# Patient Record
Sex: Male | Born: 1960
Health system: Southern US, Community
[De-identification: ages and names within clinical notes are randomized; demographics above are authoritative.]

## PROBLEM LIST (undated history)

## (undated) DIAGNOSIS — I10 Essential (primary) hypertension: Secondary | ICD-10-CM

## (undated) DIAGNOSIS — M1990 Unspecified osteoarthritis, unspecified site: Secondary | ICD-10-CM

## (undated) HISTORY — PX: ANKLE FRACTURE SURGERY: SHX122

## (undated) HISTORY — DX: Essential (primary) hypertension: I10

---

## 2009-03-06 ENCOUNTER — Emergency Department (HOSPITAL_COMMUNITY): Admission: EM | Admit: 2009-03-06 | Discharge: 2009-03-06 | Payer: Self-pay | Admitting: Emergency Medicine

## 2009-08-26 ENCOUNTER — Encounter (INDEPENDENT_AMBULATORY_CARE_PROVIDER_SITE_OTHER): Payer: Self-pay | Admitting: Family Medicine

## 2009-08-26 ENCOUNTER — Ambulatory Visit: Payer: Self-pay | Admitting: Vascular Surgery

## 2009-08-26 ENCOUNTER — Ambulatory Visit (HOSPITAL_COMMUNITY): Admission: RE | Admit: 2009-08-26 | Discharge: 2009-08-26 | Payer: Self-pay | Admitting: Family Medicine

## 2010-11-07 LAB — CBC
HCT: 45.8 % (ref 39.0–52.0)
Hemoglobin: 14.5 g/dL (ref 13.0–17.0)
MCV: 88.3 fL (ref 78.0–100.0)
Platelets: 252 10*3/uL (ref 150–400)
WBC: 14.1 10*3/uL — ABNORMAL HIGH (ref 4.0–10.5)

## 2010-11-07 LAB — COMPREHENSIVE METABOLIC PANEL
Albumin: 4 g/dL (ref 3.5–5.2)
BUN: 10 mg/dL (ref 6–23)
CO2: 29 mEq/L (ref 19–32)
Chloride: 100 mEq/L (ref 96–112)
Creatinine, Ser: 0.95 mg/dL (ref 0.4–1.5)
GFR calc non Af Amer: >60 mL/min (ref >60)
Glucose, Bld: 101 mg/dL — ABNORMAL HIGH (ref 70–99)
Total Bilirubin: 0.7 mg/dL (ref 0.3–1.2)

## 2010-11-07 LAB — DIFFERENTIAL
Basophils Absolute: 0.1 10*3/uL (ref 0.0–0.1)
Basophils Relative: 1 % (ref 0–1)
Lymphocytes Relative: 23 % (ref 12–46)
Neutro Abs: 9.6 10*3/uL — ABNORMAL HIGH (ref 1.7–7.7)
Neutrophils Relative %: 68 % (ref 43–77)

## 2010-11-07 LAB — LIPASE, BLOOD: Lipase: 27 U/L (ref 11–59)

## 2011-01-16 ENCOUNTER — Emergency Department (HOSPITAL_COMMUNITY): Payer: PRIVATE HEALTH INSURANCE

## 2011-01-16 ENCOUNTER — Emergency Department (HOSPITAL_COMMUNITY)
Admission: EM | Admit: 2011-01-16 | Discharge: 2011-01-16 | Disposition: A | Discharge status: Home / Self Care | Payer: PRIVATE HEALTH INSURANCE | Attending: Emergency Medicine | Admitting: Emergency Medicine

## 2011-01-16 DIAGNOSIS — S43016A Anterior dislocation of unspecified humerus, initial encounter: Secondary | ICD-10-CM | POA: Insufficient documentation

## 2011-01-16 DIAGNOSIS — M542 Cervicalgia: Secondary | ICD-10-CM | POA: Insufficient documentation

## 2011-01-16 DIAGNOSIS — IMO0002 Reserved for concepts with insufficient information to code with codable children: Secondary | ICD-10-CM | POA: Insufficient documentation

## 2011-01-16 DIAGNOSIS — M25519 Pain in unspecified shoulder: Secondary | ICD-10-CM | POA: Insufficient documentation

## 2015-06-14 ENCOUNTER — Emergency Department (HOSPITAL_COMMUNITY): Payer: BLUE CROSS/BLUE SHIELD

## 2015-06-14 ENCOUNTER — Encounter (HOSPITAL_COMMUNITY): Payer: Self-pay

## 2015-06-14 ENCOUNTER — Emergency Department (HOSPITAL_COMMUNITY)
Admission: EM | Admit: 2015-06-14 | Discharge: 2015-06-14 | Disposition: A | Discharge status: Home / Self Care | Payer: BLUE CROSS/BLUE SHIELD | Attending: Emergency Medicine | Admitting: Emergency Medicine

## 2015-06-14 DIAGNOSIS — S82832A Other fracture of upper and lower end of left fibula, initial encounter for closed fracture: Secondary | ICD-10-CM

## 2015-06-14 DIAGNOSIS — S99912A Unspecified injury of left ankle, initial encounter: Secondary | ICD-10-CM | POA: Diagnosis present

## 2015-06-14 DIAGNOSIS — S8252XA Displaced fracture of medial malleolus of left tibia, initial encounter for closed fracture: Secondary | ICD-10-CM | POA: Diagnosis not present

## 2015-06-14 DIAGNOSIS — W010XXA Fall on same level from slipping, tripping and stumbling without subsequent striking against object, initial encounter: Secondary | ICD-10-CM | POA: Insufficient documentation

## 2015-06-14 DIAGNOSIS — Y9389 Activity, other specified: Secondary | ICD-10-CM | POA: Diagnosis not present

## 2015-06-14 DIAGNOSIS — S8002XA Contusion of left knee, initial encounter: Secondary | ICD-10-CM | POA: Diagnosis not present

## 2015-06-14 DIAGNOSIS — Y998 Other external cause status: Secondary | ICD-10-CM | POA: Insufficient documentation

## 2015-06-14 DIAGNOSIS — S82102A Unspecified fracture of upper end of left tibia, initial encounter for closed fracture: Secondary | ICD-10-CM | POA: Diagnosis not present

## 2015-06-14 DIAGNOSIS — S82892A Other fracture of left lower leg, initial encounter for closed fracture: Secondary | ICD-10-CM

## 2015-06-14 DIAGNOSIS — Y9289 Other specified places as the place of occurrence of the external cause: Secondary | ICD-10-CM | POA: Diagnosis not present

## 2015-06-14 MED ORDER — OXYCODONE-ACETAMINOPHEN 5-325 MG PO TABS
1.0000 | ORAL_TABLET | Freq: Once | ORAL | Status: AC
  Administered 2015-06-14: 1 via ORAL
  Filled 2015-06-14: qty 1

## 2015-06-14 MED ORDER — OXYCODONE-ACETAMINOPHEN 5-325 MG PO TABS: 1.0000 | ORAL_TABLET | Freq: Four times a day (QID) | ORAL | Status: DC | PRN

## 2015-06-14 NOTE — Progress Notes (Signed)
Orthopedic Tech Progress Note Patient Details:  Brandon Reyes 1960-12-23 [id number]  Ortho Devices Type of Ortho Device: Ace wrap, Crutches, Post (short leg) splint, Knee Immobilizer Ortho Device/Splint Location: lle Ortho Device/Splint Interventions: Application   Skylynn Burkley 06/14/2015, 2:57 PM

## 2015-06-14 NOTE — ED Notes (Signed)
Pt reports onset 2 nights ago got out of car and slipped in leaves, hurt left ankle.  Pain to medial left ankle and swelling.  + pedal pulses.  Using own crutches, unable to bear full weight.

## 2015-06-14 NOTE — ED Notes (Signed)
Patient has crutches at bedside.

## 2015-06-14 NOTE — ED Notes (Signed)
Called ortho to come place splint and knee immobilizer.

## 2015-06-14 NOTE — Progress Notes (Signed)
Orthopedic Tech Progress Note Patient Details:  Brandon Reyes Sep 03, 1960 [id number]  Patient ID: [name], male   DOB: 01-16-1961, 54 y.o.   MRN: [id number] Crutch order reinstated  Hildred Priest 06/14/2015, 2:58 PM

## 2015-06-14 NOTE — ED Provider Notes (Addendum)
CSN: [id number]     Arrival date & time 06/14/15  1249 History   First MD Initiated Contact with Patient 06/14/15 1258     Chief Complaint  Patient presents with  . Ankle Injury     (Consider location/radiation/quality/duration/timing/severity/associated sxs/prior Treatment) HPI   54 year old male presents for evaluation of left leg injury. Patient states 2 days ago when he got out from his car he slipped on leaves, and fell down to the ground. He developed acute onset of pain to his left knee and left ankle. Pain is sharp throbbing achy, persistent, worsening with ambulation. He has noticed increasing bruising and swelling. He has been able to ambulate using his crutches but states pain has persisted. Aside from soaking and using warm and cool compress denies any other specific treatment. Denies any numbness. No complaints of headache, loss of consciousness, back pain or injury to any other location. He denies any precipitating symptoms prior to the fall.  No past medical history on file. No past surgical history on file. No family history on file. Social History  Substance Use Topics  . Smoking status: Never Smoker   . Smokeless tobacco: Not on file  . Alcohol Use: 3.6 oz/week    6 Cans of beer per week    Review of Systems  Constitutional: Negative for fever.  Musculoskeletal: Positive for joint swelling and arthralgias.  Skin: Negative for wound.  Neurological: Negative for numbness.      Allergies  Review of patient's allergies indicates no known allergies.  Home Medications   Prior to Admission medications   Medication Sig Start Date End Date Taking? Authorizing Provider  oxyCODONE-acetaminophen (PERCOCET/ROXICET) 5-325 MG tablet Take 1 tablet by mouth every 6 (six) hours as needed for severe pain. 06/14/15   Domenic Moras, PA-C   BP 153/88 mmHg  Pulse 77  Temp(Src) 98.1 F (36.7 C) (Oral)  Resp 16  SpO2 97% Physical Exam  Constitutional: He appears  well-developed and well-nourished. No distress.  HENT:  Head: Atraumatic.  Eyes: Conjunctivae are normal.  Neck: Neck supple.  Cardiovascular: Intact distal pulses.   Musculoskeletal: He exhibits tenderness (L knee: tenderness to lateral knee inferior to knee cap with mild bruising and swelling noted, no crepitus. normal knee flexion/extension.  L ankle:  tenderness to medial/lateral/posterior malleolar on palpation with decreased ROM, moderate swelling/bruise).  Neurological: He is alert.  Skin: No rash noted.  Psychiatric: He has a normal mood and affect.  Nursing note and vitals reviewed.   ED Course  Procedures (including critical care time)  12:34 PM Mechanical fall 2 days ago here with knee and ankle pain.  Swelling and bruising noted to L ankle.  NVI.  Xrays ordered, pain medication given.    12:34 PM L ankle xray with small avulsion type fx left ankle medial malleolar tip.  L tib/fib: nondsplaced diaphyseal fracture proximal left fibula.    Will consult ortho.  Pt will be placed in a knee immobilizer and a posterior splint.  He has crutches available.    12:34 PM Appreciates consultation from Dr. Edmonia Lynch who agrees to f/u with pt on MOnday at 8:30am for further care.  Pt is aware and agrees with plan.    Labs Review Labs Reviewed - No data to display  Imaging Review No results found. I have personally reviewed and evaluated these images and lab results as part of my medical decision-making.   EKG Interpretation None      MDM   Final diagnoses:  Fracture of tibia, proximal, left, closed  Left malleolar fracture, closed, initial encounter  Fracture, fibula, proximal, left, closed, initial encounter    BP 153/88 mmHg  Pulse 77  Temp(Src) 98.1 F (36.7 C) (Oral)  Resp 16  SpO2 97%      Domenic Moras, PA-C 06/14/15 2153  Gareth Morgan, MD 06/15/15 [id number]  Gareth Morgan, MD 07/11/15 1234  Gareth Morgan, MD 07/11/15 1234

## 2015-06-14 NOTE — Discharge Instructions (Signed)
You have a broken bone near your left knee and a broken bone in your left ankle.  Wear knee immobilizer, and ankle splint.  Use crutches, and stay non weight bearing.  Take pain medication as needed and follow up with orthopedist specialist next week for further care.   Fibular Fracture With Rehab The fibula is the smaller of the two lower leg bones and is vulnerable to breaks (fracture). Fibular fractures may go fully through the bone (complete) or partially (incomplete). The bone fragments are rarely out of alignment (displaced fracture). Fibula fractures may occur anywhere along the bone. However, this document only discusses fractures that do not involve a leg joint. Fibular fractures are not often a severe injury because the bone supports only about 17% of the body weight. SYMPTOMS   Moderate to severe pain in the lower leg.  Tenderness and swelling in the leg or calf.  Bleeding and/or bruising (contusion) in the leg.  Inability to bear weight on the injured extremity.  Visible deformity, if the fracture is displaced.  Numbness and coldness in the leg and foot, beyond the fracture site, if blood supply is impaired. CAUSES  Fractures occur when a force is placed on the bone that is greater than it can withstand. Common causes of fibular fracture include:  Direct hit (trauma) (i.e., hockey or lacrosse check to the lower leg).  Stress fracture (weakening of the bone from repeated stress).  Indirect injury, caused by twisting, turning quickly, or violent muscle contraction. RISK INCREASES WITH:  Contact sports (i.e., football, soccer, lacrosse, hockey).  Sports that can cause twisted ankle injury (i.e., skiing, basketball).  Bony abnormalities (i.e., osteoporosis or bone tumors).  Metabolism disorders, hormone problems, and nutrition deficiency and disorders (i.e., anorexia and bulimia).  Poor strength and flexibility. PREVENTION   Warm up and stretch properly before  activity.  Maintain physical fitness:  Strength, flexibility, and endurance.  Cardiovascular fitness.  Wear properly fitted and padded protective equipment (i.e., shin guards for soccer). PROGNOSIS  If treated properly, fibular fractures usually heal in 4 to 6 weeks.  RELATED COMPLICATIONS   Failure of bone to heal (nonunion).  Bone heals in a poor position (malunion).  Increased pressure inside the leg (compartment syndrome) due to injury that disrupts the blood supply to the leg and foot and injures the nerves and muscles (uncommon).  Shortening of the injured bones.  Hindrance of normal bone growth in children.  Risks of surgery: infection, bleeding, injury to nerves (numbness, weakness, paralysis), need for further surgery.  Longer healing time if activity is resumed too soon. TREATMENT Treatment first involves ice, medicine, and elevation of the leg to reduce pain and inflammation. People with fibular fractures are advised to walk using crutches. A brace or walking boot may be given to restrain the injured leg and allow for healing. Sometimes, surgery is needed to place a rod, plate, or screws in the bones in order to fix the fracture. After surgery, the leg is restrained. After restraint (with or without surgery), it is important to complete strengthening and stretching exercises to regain strength and a full range of motion. Exercises may be completed at home or with a therapist. MEDICATION   If pain medicine is needed, nonsteroidal anti-inflammatory medicines (aspirin and ibuprofen), or other minor pain relievers (acetaminophen), are often advised.  Do not take pain medicine for 7 days before surgery.  Prescription pain relievers may be given if your health care provider thinks they are needed. Use only as directed  and only as much as you need. SEEK MEDICAL CARE IF:  Symptoms get worse or do not improve in 2 weeks, despite treatment.  The following occur after restraint  or surgery. (Report any of these signs immediately):  Swelling above or below the fracture site.  Severe, persistent pain.  Blue or gray skin below the fracture site, especially under the toenails. Numbness or loss of feeling below the fracture site.  New, unexplained symptoms develop. (Drugs used in treatment may produce side effects.) EXERCISES  RANGE OF MOTION (ROM) AND STRETCHING EXERCISES - Fibular Fracture These exercises may help you when beginning to recover from your injury. Your symptoms may go away with or without further involvement from your physician, physical therapist or athletic trainer. While completing these exercises, remember:   Restoring tissue flexibility helps normal motion to return to the joints. This allows healthier, less painful movement and activity.  An effective stretch should be held for at least 30 seconds.  A stretch should never be painful. You should only feel a gentle lengthening or release in the stretched tissue. RANGE OF MOTION - Dorsi/Plantar Flexion  While sitting with your right / left knee straight, draw the top of your foot upwards by flexing your ankle. Then reverse the motion, pointing your toes downward.  Hold each position for __________ seconds.  After completing your first set of exercises, repeat this exercise with your knee bent. Repeat __________ times. Complete this exercise __________ times per day.  STRETCH - Gastrocsoleus   Sit with your right / left leg extended. Holding onto both ends of a belt or towel, loop it around the ball of your foot.  Keeping your right / left ankle and foot relaxed and your knee straight, pull your foot and ankle toward you using the belt.  You should feel a gentle stretch behind your calf or knee. Hold this position for __________ seconds. Repeat __________ times. Complete this stretch __________ times per day.  RANGE OF MOTION- Ankle Plantar Flexion   Sit with your right / left leg crossed  over your opposite knee.  Use your opposite hand to pull the top of your foot and toes toward you.  You should feel a gentle stretch on the top of your foot and ankle. Hold this position for __________ seconds. Repeat __________ times. Complete __________ times per day.  RANGE OF MOTION - Ankle Eversion  Sit with your right / left ankle crossed over your opposite knee.  Grip your foot with your opposite hand, placing your thumb on the top of your foot and your fingers across the bottom of your foot.  Gently push your foot downward with a slight rotation so your littlest toes rise slightly toward the ceiling.  You should feel a gentle stretch on the inside of your ankle. Hold the stretch for __________ seconds. Repeat __________ times. Complete this exercise __________ times per day.  RANGE OF MOTION - Ankle Inversion  Sit with your right / left ankle crossed over your opposite knee.  Grip your foot with your opposite hand, placing your thumb on the bottom of your foot and your fingers across the top of your foot.  Gently pull your foot so the smallest toe comes toward you and your thumb pushes the inside of the ball of your foot away from you.  You should feel a gentle stretch on the outside of your ankle. Hold the stretch for __________ seconds. Repeat __________ times. Complete this exercise __________ times per day.  RANGE OF MOTION - Ankle Alphabet  Imagine your right / left big toe is a pen.  Keeping your hip and knee still, write out the entire alphabet with your "pen." Make the letters as large as you can, without increasing any discomfort. Repeat __________ times. Complete this exercise __________ times per day.  RANGE OF MOTION - Ankle Dorsiflexion, Active Assisted  Remove your shoes and sit on a chair, preferably not on a carpeted surface.  Place your right / left foot on the floor, directly under your knee. Extend your opposite leg for support.  Keeping your heel  down, slide your right / left foot back toward the chair, until you feel a stretch at your ankle or calf. If you do not feel a stretch, slide your bottom forward to the edge of the chair, while still keeping your heel down.  Hold this stretch for __________ seconds. Repeat __________ times. Complete this stretch __________ times per day.  STRENGTHENING EXERCISES - Fibular Fracture These exercises may help you when beginning to recover from your injury. They may resolve your symptoms with or without further involvement from your physician, physical therapist or athletic trainer. While completing these exercises, remember:   Muscles can gain both the endurance and the strength needed for everyday activities through controlled exercises.  Complete these exercises as instructed by your physician, physical therapist or athletic trainer. Increase the resistance and repetitions only as guided.  You may experience muscle soreness or fatigue, but the pain or discomfort you are trying to eliminate should never worsen during these exercises. If this pain does get worse, stop and make certain you are following the directions exactly. If the pain is still present after adjustments, discontinue the exercise until you can discuss the trouble with your clinician. STRENGTH - Dorsiflexors  Secure a rubber exercise band or tubing to a fixed object (table, pole) and loop the other end around your right / left foot.  Sit on the floor, facing the fixed object. The band should be slightly tense when your foot is relaxed.  Slowly draw your foot back toward you, using your ankle and toes.  Hold this position for __________ seconds. Slowly release the tension in the band and return your foot to the starting position. Repeat __________ times. Complete this exercise __________ times per day.  STRENGTH - Plantar-flexors  Sit with your right / left leg extended. Holding onto both ends of a rubber exercise band or tubing,  loop it around the ball of your foot. Keep a slight tension in the band.  Slowly push your toes away from you, pointing them downward.  Hold this position for __________ seconds. Return to the starting position slowly, controlling the tension in the band. Repeat __________ times. Complete this exercise __________ times per day.  STRENGTH - Plantar-flexors, Standing   Stand with your feet shoulder width apart. Place your hands on a wall or table to steady yourself, using as little support as needed.  Keeping your weight evenly spread over the width of your feet, rise up on your toes.*  Hold this position for __________ seconds. Repeat __________ times. Complete this exercise __________ times per day.  *If this is too easy, shift your weight toward your right / left leg until you feel challenged. Ultimately, you may be asked to do this exercise while standing on your right / left foot only. STRENGTH - Towel Curls  Sit in a chair, on a non-carpeted surface.  Place your foot on a towel,  keeping your heel on the floor.  Pull the towel toward your heel only by curling your toes. Keep your heel on the floor.  If instructed by your physician, physical therapist or athletic trainer, add ____________________ at the end of the towel. Repeat __________ times. Complete this exercise __________ times per day. STRENGTH - Ankle Eversion  Secure one end of a rubber exercise band or tubing to a fixed object (table, pole). Loop the other end around your foot, just before your toes.  Place your fists between your knees. This will focus your strengthening at your ankle.  Drawing the band across your opposite foot, away from the pole, slowly pull your little toe out and up. Make sure the band is positioned to resist the entire motion.  Hold this position for __________ seconds.  Return to the starting position slowly, controlling the tension in the band. Repeat __________ times. Complete this exercise  __________ times per day.  STRENGTH - Ankle Inversion  Secure one end of a rubber exercise band or tubing to a fixed object (table, pole). Loop the other end around your foot, just before your toes.  Place your fists between your knees. This will focus your strengthening at your ankle.  Slowly, pull your big toe up and in, making sure the band is positioned to resist the entire motion.  Hold this position for __________ seconds.  Return to the starting position slowly, controlling the tension in the band. Repeat __________ times. Complete this exercises __________ times per day.    This information is not intended to replace advice given to you by your health care provider. Make sure you discuss any questions you have with your health care provider.   Document Released: 07/18/2005 Document Revised: 08/08/2014 Document Reviewed: 10/30/2008 Elsevier Interactive Patient Education 2016 [employer] Ankle Fracture A fracture is a break in a bone. The ankle joint is made up of three bones. These include the lower (distal)sections of your lower leg bones, called the tibia and fibula, along with a bone in your foot, called the talus. Depending on how bad the break is and if more than one ankle joint bone is broken, a cast or splint is used to protect and keep your injured bone from moving while it heals. Sometimes, surgery is required to help the fracture heal properly.  There are two general types of fractures:  Stable fracture. This includes a single fracture line through one bone, with no injury to ankle ligaments. A fracture of the talus that does not have any displacement (movement of the bone on either side of the fracture line) is also stable.  Unstable fracture. This includes more than one fracture line through one or more bones in the ankle joint. It also includes fractures that have displacement of the bone on either side of the fracture line. CAUSES  A direct blow to the ankle.    Quickly and severely twisting your ankle.  Trauma, such as a car accident or falling from a significant height. RISK FACTORS You may be at a higher risk of ankle fracture if:  You have certain medical conditions.  You are involved in high-impact sports.  You are involved in a high-impact car accident. SIGNS AND SYMPTOMS   Tender and swollen ankle.  Bruising around the injured ankle.  Pain on movement of the ankle.  Difficulty walking or putting weight on the ankle.  A cold foot below the site of the ankle injury. This can occur if the blood vessels  passing through your injured ankle were also damaged.  Numbness in the foot below the site of the ankle injury. DIAGNOSIS  An ankle fracture is usually diagnosed with a physical exam and X-rays. A CT scan may also be required for complex fractures. TREATMENT  Stable fractures are treated with a cast or splint and using crutches to avoid putting weight on your injured ankle. This is followed by an ankle strengthening program. Some patients require a special type of cast, depending on other medical problems they may have. Unstable fractures require surgery to ensure the bones heal properly. Your health care provider will tell you what type of fracture you have and the best treatment for your condition. HOME CARE INSTRUCTIONS   Review correct crutch use with your health care provider and use your crutches as directed. Safe use of crutches is extremely important. Misuse of crutches can cause you to fall or cause injury to nerves in your hands or armpits.  Do not put weight or pressure on the injured ankle until directed by your health care provider.  To lessen the swelling, keep the injured leg elevated while sitting or lying down.  Apply ice to the injured area:  Put ice in a plastic bag.  Place a towel between your cast and the bag.  Leave the ice on for 20 minutes, 2-3 times a day.  If you have a plaster or fiberglass  cast:  Do not try to scratch the skin under the cast with any objects. This can increase your risk of skin infection.  Check the skin around the cast every day. You may put lotion on any red or sore areas.  Keep your cast dry and clean.  If you have a plaster splint:  Wear the splint as directed.  You may loosen the elastic around the splint if your toes become numb, tingle, or turn cold or blue.  Do not put pressure on any part of your cast or splint; it may break. Rest your cast only on a pillow the first 24 hours until it is fully hardened.  Your cast or splint can be protected during bathing with a plastic bag sealed to your skin with medical tape. Do not lower the cast or splint into water.  Take medicines as directed by your health care provider. Only take over-the-counter or prescription medicines for pain, discomfort, or fever as directed by your health care provider.  Do not drive a vehicle until your health care provider specifically tells you it is safe to do so.  If your health care provider has given you a follow-up appointment, it is very important to keep that appointment. Not keeping the appointment could result in a chronic or permanent injury, pain, and disability. If you have any problem keeping the appointment, call the facility for assistance. SEEK MEDICAL CARE IF: You develop increased swelling or discomfort. SEEK IMMEDIATE MEDICAL CARE IF:   Your cast gets damaged or breaks.  You have continued severe pain.  You develop new pain or swelling after the cast was put on.  Your skin or toenails below the injury turn blue or gray.  Your skin or toenails below the injury feel cold, numb, or have loss of sensitivity to touch.  There is a bad smell or pus draining from under the cast. MAKE SURE YOU:   Understand these instructions.  Will watch your condition.  Will get help right away if you are not doing well or get worse.   This information  is not  intended to replace advice given to you by your health care provider. Make sure you discuss any questions you have with your health care provider.   Document Released: 07/15/2000 Document Revised: 07/23/2013 Document Reviewed: 02/14/2013 Elsevier Interactive Patient Education [employer].

## 2016-05-20 ENCOUNTER — Ambulatory Visit (INDEPENDENT_AMBULATORY_CARE_PROVIDER_SITE_OTHER): Payer: Self-pay | Admitting: Family

## 2016-05-26 ENCOUNTER — Ambulatory Visit (INDEPENDENT_AMBULATORY_CARE_PROVIDER_SITE_OTHER): Payer: Self-pay | Admitting: Family

## 2016-05-27 ENCOUNTER — Encounter (INDEPENDENT_AMBULATORY_CARE_PROVIDER_SITE_OTHER): Payer: Self-pay | Admitting: Family

## 2016-05-27 ENCOUNTER — Ambulatory Visit (INDEPENDENT_AMBULATORY_CARE_PROVIDER_SITE_OTHER): Payer: BLUE CROSS/BLUE SHIELD | Admitting: Family

## 2016-05-27 VITALS — Ht 64.0 in | Wt 210.0 lb

## 2016-05-27 DIAGNOSIS — M25572 Pain in left ankle and joints of left foot: Secondary | ICD-10-CM | POA: Diagnosis not present

## 2016-05-27 MED ORDER — TRAMADOL HCL 50 MG PO TABS: 50.0000 mg | ORAL_TABLET | Freq: Two times a day (BID) | ORAL | 0 refills | Status: DC

## 2016-05-27 NOTE — Progress Notes (Signed)
   Office Visit Note   Patient: Brandon Reyes           Date of Birth: 24-Jun-1961           MRN: [id number] Visit Date: 05/27/2016              Requested by: No referring provider defined for this encounter. PCP: No PCP Per Patient   Assessment & Plan: Visit Diagnoses:  1. Pain in left ankle and joints of left foot     Plan: Discussed that may benefit from ankle fusion in the future if continued pain. Will consider this. Have provided cortisone ankle injection today. Will follow up in office as needed.   Follow-Up Instructions: No Follow-up on file.   Orders:  No orders of the defined types were placed in this encounter.  No orders of the defined types were placed in this encounter.     Procedures: No procedures performed   Clinical Data: No additional findings.   Subjective: Chief Complaint  Patient presents with  . Left Ankle - Pain    Patients presents in office today with left ankle pain requesting an injection. He has prior history of nondisplaced diaphyseal fracture of proximal left fibula and small avulsion fracture medial malleolus 06/12/15. He was last seen in the office 01/26/16 for vocational rehabilitation assessment. He declines xrays today, he has pain lateral left ankle. Patient complains of persistent swelling. He is not currently taking any medication of pain management.   ORIF by Dr. Percell Miller. Has had previous injections with him. Last injection was in January. Reports these are providing good interval relief.   Review of Systems  Constitutional: Negative for chills and fever.  Musculoskeletal: Positive for arthralgias and joint swelling.  All other systems reviewed and are negative.    Objective: Vital Signs: Ht 5\' 4"  (1.626 m)   Wt 210 lb (95.3 kg)   BMI 36.05 kg/m   Physical Exam  Ortho Exam Left ankle with minimal swelling. Does continue to have tenderness across anterior ankle joint. Impingement pain with range of motion. Pain with  dorsiflexion. Does have heel cord tightness with dorsiflexion to neutral.  Specialty Comments:  No specialty comments available.  Imaging: No results found.   PMFS History: There are no active problems to display for this patient.  No past medical history on file.  No family history on file.  No past surgical history on file. Social History   Occupational History  . Not on file.   Social History Main Topics  . Smoking status: Never Smoker  . Smokeless tobacco: Never Used  . Alcohol use 3.6 oz/week    6 Cans of beer per week  . Drug use: No  . Sexual activity: Not on file       +

## 2016-05-30 NOTE — Progress Notes (Deleted)
  [facility] [place],  [address] Phone: [phone] Subjective:     CC: ankle pain, left  [id number]  Brandon Reyes is a 55 y.o. male coming in with complaint of Left ankle pain. Patient past medical history is significant for an ankle fracture approximately 1 year ago on this ankle. Seem to be a small avulsion fracture of the medial malleolus as well as a proximal left fibula fracture. Patient states he continues to have pain on the lateral aspect left ankle. Persistent swelling. Not taking any medications. Patient did have an ORIF previously of this ankle. Patient is also been given intermittent injections previously he states on the lateral aspect the ankle. Patient states    No past medical history on file. No past surgical history on file. Social History   Social History  . Marital status: Married    Spouse name: N/A  . Number of children: N/A  . Years of education: N/A   Social History Main Topics  . Smoking status: Never Smoker  . Smokeless tobacco: Never Used  . Alcohol use 3.6 oz/week    6 Cans of beer per week  . Drug use: No  . Sexual activity: Not on file   Other Topics Concern  . Not on file   Social History Narrative  . No narrative on file   No Known Allergies No family history on file.  Past medical history, social, surgical and family history all reviewed in electronic medical record.  No pertanent information unless stated regarding to the chief complaint.   Review of Systems: No headache, visual changes, nausea, vomiting, diarrhea, constipation, dizziness, abdominal pain, skin rash, fevers, chills, night sweats, weight loss, swollen lymph nodes, body aches, joint swelling, muscle aches, chest pain, shortness of breath, mood changes.   Objective  There were no vitals taken for this visit.  General: No apparent distress alert and oriented x3 mood and affect normal, dressed appropriately.  HEENT:  Pupils equal, extraocular movements intact  Respiratory: Patient's speak in full sentences and does not appear short of breath  Cardiovascular: No lower extremity edema, non tender, no erythema  Skin: Warm dry intact with no signs of infection or rash on extremities or on axial skeleton.  Abdomen: Soft nontender  Neuro: Cranial nerves II through XII are intact, neurovascularly intact in all extremities with 2+ DTRs and 2+ pulses.  Lymph: No lymphadenopathy of posterior or anterior cervical chain or axillae bilaterally.  Gait normal with good balance and coordination.  MSK:  Non tender with full range of motion and good stability and symmetric strength and tone of shoulders, elbows, wrist, hip, knee and ankles bilaterally.     Impression and Recommendations:     This case required medical decision making of moderate complexity.      Note: This dictation was prepared with Dragon dictation along with smaller phrase technology. Any transcriptional errors that result from this process are unintentional.

## 2016-05-31 ENCOUNTER — Ambulatory Visit: Payer: BLUE CROSS/BLUE SHIELD | Admitting: Family Medicine

## 2016-07-29 ENCOUNTER — Ambulatory Visit (INDEPENDENT_AMBULATORY_CARE_PROVIDER_SITE_OTHER): Payer: BLUE CROSS/BLUE SHIELD | Admitting: Family

## 2016-07-30 ENCOUNTER — Emergency Department (HOSPITAL_COMMUNITY)
Admission: EM | Admit: 2016-07-30 | Discharge: 2016-07-30 | Disposition: A | Discharge status: Home / Self Care | Payer: BLUE CROSS/BLUE SHIELD | Attending: Physician Assistant | Admitting: Physician Assistant

## 2016-07-30 ENCOUNTER — Encounter (HOSPITAL_COMMUNITY): Payer: Self-pay | Admitting: Certified Nurse Midwife

## 2016-07-30 ENCOUNTER — Emergency Department (HOSPITAL_COMMUNITY): Payer: BLUE CROSS/BLUE SHIELD

## 2016-07-30 DIAGNOSIS — M25572 Pain in left ankle and joints of left foot: Secondary | ICD-10-CM | POA: Diagnosis not present

## 2016-07-30 DIAGNOSIS — Z8781 Personal history of (healed) traumatic fracture: Secondary | ICD-10-CM | POA: Insufficient documentation

## 2016-07-30 MED ORDER — OXYCODONE-ACETAMINOPHEN 5-325 MG PO TABS
1.0000 | ORAL_TABLET | Freq: Once | ORAL | Status: AC
  Administered 2016-07-30: 1 via ORAL
  Filled 2016-07-30: qty 1

## 2016-07-30 MED ORDER — TRAMADOL HCL 50 MG PO TABS: 50.0000 mg | ORAL_TABLET | Freq: Four times a day (QID) | ORAL | 0 refills | Status: DC | PRN

## 2016-07-30 MED ORDER — DICLOFENAC SODIUM 50 MG PO TBEC: 50.0000 mg | DELAYED_RELEASE_TABLET | Freq: Two times a day (BID) | ORAL | 0 refills | Status: DC

## 2016-07-30 NOTE — ED Notes (Signed)
Ambulated pt from waiting to TR03, waiting for provider.

## 2016-07-30 NOTE — Discharge Instructions (Signed)
Do not drive or work while taking the narcotic pain medication. Follow up with Dr. Noemi Chapel for further evaluation.

## 2016-07-30 NOTE — ED Notes (Signed)
Pt out for xray

## 2016-07-30 NOTE — ED Notes (Signed)
Applied ASO on pt ankle and gave pt an ice pack per Neese(NP)

## 2016-07-30 NOTE — ED Notes (Signed)
ED Provider at bedside. 

## 2016-07-30 NOTE — ED Triage Notes (Signed)
Pt broke his ankle in Nov 16 and has new onset pain in same area. Pt states the ankle is swollen. Pt's family member states "we are here for an x-ray and medication".

## 2016-07-30 NOTE — ED Provider Notes (Signed)
[facility] DEPT Provider Note   CSN: [id number] Arrival date & time: 07/30/16  1436  By signing my name below, I, Delton Prairie, attest that this documentation has been prepared under the direction and in the presence of  Debroah Baller, NP. Electronically Signed: Delton Prairie, ED Scribe. 07/30/16. 5:44 PM.  History   Chief Complaint Chief Complaint  Patient presents with  . Ankle Pain   The history is provided by the patient. No language interpreter was used.  Ankle Pain   The incident occurred 6 to 12 hours ago. The injury mechanism was a fall. The pain is present in the left ankle. The pain is moderate. Pertinent negatives include no numbness and no loss of sensation. He reports no foreign bodies present. The symptoms are aggravated by palpation. He has tried nothing for the symptoms. The treatment provided no relief.   HPI Comments:  Brandon Reyes is a 55 y.o. male, with a hx of arthritis and PSHx of ankle fracture surgery, who presents to the Emergency Department complaining of sudden onset, moderate left ankle pain x today. His pain is worse to the touch Pt states he intially broke his ankle s/p a fall after slipping on leaves in the past. Pt was followed by Dr. Noemi Chapel, had surgery (last year) but notes he stopped seeing him due to a large co-pay every visit. No alleviating factors noted. Pt denies any other associated symptoms and any other modifying factors at this time.   History reviewed. No pertinent past medical history.  There are no active problems to display for this patient.   Past Surgical History:  Procedure Laterality Date  . ANKLE FRACTURE SURGERY        Home Medications    Prior to Admission medications   Medication Sig Start Date End Date Taking? Authorizing Provider  diclofenac (VOLTAREN) 50 MG EC tablet Take 1 tablet (50 mg total) by mouth 2 (two) times daily. 07/30/16   Hope Bunnie Pion, NP  traMADol (ULTRAM) 50 MG tablet Take 1 tablet (50 mg total) by mouth  every 6 (six) hours as needed. 07/30/16   Hope Bunnie Pion, NP    Family History No family history on file.  Social History Social History  Substance Use Topics  . Smoking status: Never Smoker  . Smokeless tobacco: Never Used  . Alcohol use No     Allergies   Patient has no known allergies.   Review of Systems Review of Systems  Musculoskeletal: Positive for arthralgias and joint swelling.  Neurological: Negative for numbness.   Physical Exam Updated Vital Signs BP 125/85   Pulse 85   Temp 98 F (36.7 C)   Resp 15   Ht 5\' 5"  (1.651 m)   Wt 95.3 kg   SpO2 99%   BMI 34.95 kg/m   Physical Exam  Constitutional: He is oriented to person, place, and time. He appears well-developed and well-nourished. No distress.  HENT:  Head: Normocephalic and atraumatic.  Eyes: Conjunctivae and EOM are normal.  Neck: Neck supple.  Cardiovascular: Normal rate.   Pulmonary/Chest: Effort normal.  Musculoskeletal: He exhibits edema and tenderness.       Left foot: There is tenderness and swelling. There is normal capillary refill and no laceration.  Swelling to the medial and lateral aspect of the left ankle. Tenderness ot the lateral malleolus. Achilles with no defect palpated. Adequate circulation. Plantarflexion and dorsiflexion without difficulty. Good strength. Pain to the lateral malleous with any attempt of ROM  Pedal pulse  2+.  Neurological: He is alert and oriented to person, place, and time.  Skin: Skin is warm and dry.  Psychiatric: He has a normal mood and affect. His behavior is normal.  Nursing note and vitals reviewed.   ED Treatments / Results  DIAGNOSTIC STUDIES:  Oxygen Saturation is 95% on RA, normal by my interpretation.    COORDINATION OF CARE:  5:30 PM Discussed treatment plan with pt at bedside and pt agreed to plan.  Labs (all labs ordered are listed, but only abnormal results are displayed) Labs Reviewed - No data to display  Radiology Dg Ankle Complete  Left  Result Date: 07/30/2016 CLINICAL DATA:  Worsening ankle pain.  ORIF last year. EXAM: LEFT ANKLE COMPLETE - 3+ VIEW COMPARISON:  06/14/2015. FINDINGS: Previous ORIF with transverse tibia fibular screw with lateral fibular plate. Medial tibial plate device. There is osteoarthritis of the ankle joint with joint space narrowing. There are probably small intra-articular loose bodies. IMPRESSION: Postoperative changes. Developing arthritis of the tibiotalar articulation with probable loose bodies. Electronically Signed   By: Nelson Chimes M.D.   On: 07/30/2016 17:31    Procedures Procedures (including critical care time)  Medications Ordered in ED Medications  oxyCODONE-acetaminophen (PERCOCET/ROXICET) 5-325 MG per tablet 1 tablet (1 tablet Oral Given 07/30/16 1800)     Initial Impression / Assessment and Plan / ED Course  I have reviewed the triage vital signs and the nursing notes.  Pertinent imaging results that were available during my care of the patient were reviewed by me and considered in my medical decision making (see chart for details).  Clinical Course     Patient X-Ray negative for obvious fracture or dislocation. There does appear to be arthritis. Pt advised to follow up with orthopedics. Patient given ASO while in ED, conservative therapy recommended and discussed. Patient will be discharged home & is agreeable with above plan. Returns precautions discussed. Pt appears safe for discharge.   Final Clinical Impressions(s) / ED Diagnoses   Final diagnoses:  Left ankle pain, unspecified chronicity    New Prescriptions Discharge Medication List as of 07/30/2016  5:43 PM    START taking these medications   Details  diclofenac (VOLTAREN) 50 MG EC tablet Take 1 tablet (50 mg total) by mouth 2 (two) times daily., Starting Sat 07/30/2016, Print      I personally performed the services described in this documentation, which was scribed in my presence. The recorded  information has been reviewed and is accurate.     [place], NP 07/31/16 0158    Courteney Julio Alm, MD 07/31/16 [id number]

## 2016-08-04 ENCOUNTER — Ambulatory Visit (INDEPENDENT_AMBULATORY_CARE_PROVIDER_SITE_OTHER): Payer: BLUE CROSS/BLUE SHIELD | Admitting: Orthopedic Surgery

## 2016-09-07 DIAGNOSIS — K429 Umbilical hernia without obstruction or gangrene: Secondary | ICD-10-CM | POA: Diagnosis not present

## 2016-09-20 NOTE — Patient Instructions (Signed)
Brandon Reyes  09/20/2016   Your procedure is scheduled on: 09/26/2016    Report to [facility] Main  Entrance take [place]  elevators to 3rd floor to  [place] at    [place] AM.  Call this number if you have problems the morning of surgery [phone]   Remember: ONLY 1 PERSON MAY GO WITH YOU TO SHORT STAY TO GET  READY MORNING OF [place].  Do not eat food or drink liquids :After Midnight.     Take these medicines the morning of surgery with A SIP OF WATER: none                                 You may not have any metal on your body including hair pins and              piercings  Do not wear jewelry,  lotions, powders or perfumes, deodorant                    Men may shave face and neck.   Do not bring valuables to the hospital. [place].  Contacts, dentures or bridgework may not be worn into surgery.  Leave suitcase in the car. After surgery it may be brought to your room.                      Please read over the following fact sheets you were given: _____________________________________________________________________             [facility] - Preparing for Surgery Before surgery, you can play an important role.  Because skin is not sterile, your skin needs to be as free of germs as possible.  You can reduce the number of germs on your skin by washing with CHG (chlorahexidine gluconate) soap before surgery.  CHG is an antiseptic cleaner which kills germs and bonds with the skin to continue killing germs even after washing. Please DO NOT use if you have an allergy to CHG or antibacterial soaps.  If your skin becomes reddened/irritated stop using the CHG and inform your nurse when you arrive at Short Stay. Do not shave (including legs and underarms) for at least 48 hours prior to the first CHG shower.  You may shave your face/neck. Please follow these instructions carefully:  1.  Shower with  CHG Soap the night before surgery and the  morning of Surgery.  2.  If you choose to wash your hair, wash your hair first as usual with your  normal  shampoo.  3.  After you shampoo, rinse your hair and body thoroughly to remove the  shampoo.                           4.  Use CHG as you would any other liquid soap.  You can apply chg directly  to the skin and wash                       Gently with a scrungie or clean washcloth.  5.  Apply the CHG Soap to your body ONLY FROM THE NECK DOWN.   Do not  use on face/ open                           Wound or open sores. Avoid contact with eyes, ears mouth and genitals (private parts).                       Wash face,  Genitals (private parts) with your normal soap.             6.  Wash thoroughly, paying special attention to the area where your surgery  will be performed.  7.  Thoroughly rinse your body with warm water from the neck down.  8.  DO NOT shower/wash with your normal soap after using and rinsing off  the CHG Soap.                9.  Pat yourself dry with a clean towel.            10.  Wear clean pajamas.            11.  Place clean sheets on your bed the night of your first shower and do not  sleep with pets. Day of Surgery : Do not apply any lotions/deodorants the morning of surgery.  Please wear clean clothes to the hospital/surgery center.  FAILURE TO FOLLOW THESE INSTRUCTIONS MAY RESULT IN THE CANCELLATION OF YOUR SURGERY PATIENT SIGNATURE_________________________________  NURSE SIGNATURE__________________________________  ________________________________________________________________________

## 2016-09-21 ENCOUNTER — Ambulatory Visit: Payer: Self-pay | Admitting: General Surgery

## 2016-09-21 ENCOUNTER — Encounter (HOSPITAL_COMMUNITY)
Admission: RE | Admit: 2016-09-21 | Discharge: 2016-09-21 | Disposition: A | Discharge status: Home / Self Care | Payer: 59 | Source: Ambulatory Visit | Attending: General Surgery | Admitting: General Surgery

## 2016-09-21 ENCOUNTER — Encounter (HOSPITAL_COMMUNITY): Payer: Self-pay

## 2016-09-21 ENCOUNTER — Encounter (INDEPENDENT_AMBULATORY_CARE_PROVIDER_SITE_OTHER): Payer: Self-pay

## 2016-09-21 DIAGNOSIS — Z01818 Encounter for other preprocedural examination: Secondary | ICD-10-CM | POA: Diagnosis not present

## 2016-09-21 HISTORY — DX: Unspecified osteoarthritis, unspecified site: M19.90

## 2016-09-21 LAB — CBC
HCT: 40.8 % (ref 39.0–52.0)
Hemoglobin: 12.8 g/dL — ABNORMAL LOW (ref 13.0–17.0)
MCH: 27.8 pg (ref 26.0–34.0)
MCHC: 31.4 g/dL (ref 30.0–36.0)
MCV: 88.5 fL (ref 78.0–100.0)
Platelets: 224 10*3/uL (ref 150–400)
RBC: 4.61 MIL/uL (ref 4.22–5.81)
RDW: 13.3 % (ref 11.5–15.5)
WBC: 10.8 10*3/uL — AB (ref 4.0–10.5)

## 2016-09-26 ENCOUNTER — Ambulatory Visit (HOSPITAL_COMMUNITY)
Admission: RE | Admit: 2016-09-26 | Discharge: 2016-09-26 | Disposition: A | Discharge status: Home / Self Care | Payer: 59 | Source: Ambulatory Visit | Attending: General Surgery | Admitting: General Surgery

## 2016-09-26 ENCOUNTER — Encounter (HOSPITAL_COMMUNITY): Payer: Self-pay | Admitting: *Deleted

## 2016-09-26 ENCOUNTER — Ambulatory Visit (HOSPITAL_COMMUNITY): Payer: 59 | Admitting: Anesthesiology

## 2016-09-26 ENCOUNTER — Encounter (HOSPITAL_COMMUNITY)
Admission: RE | Disposition: A | Discharge status: Home / Self Care | Payer: Self-pay | Source: Ambulatory Visit | Attending: General Surgery

## 2016-09-26 DIAGNOSIS — K429 Umbilical hernia without obstruction or gangrene: Secondary | ICD-10-CM | POA: Diagnosis not present

## 2016-09-26 HISTORY — PX: INSERTION OF MESH: SHX5868

## 2016-09-26 HISTORY — PX: UMBILICAL HERNIA REPAIR: SHX196

## 2016-09-26 SURGERY — REPAIR, HERNIA, UMBILICAL, LAPAROSCOPIC
Anesthesia: General | Site: Abdomen

## 2016-09-26 MED ORDER — KETOROLAC TROMETHAMINE 30 MG/ML IJ SOLN
INTRAMUSCULAR | Status: AC
  Filled 2016-09-26: qty 1

## 2016-09-26 MED ORDER — ROCURONIUM BROMIDE 50 MG/5ML IV SOSY
PREFILLED_SYRINGE | INTRAVENOUS | Status: DC | PRN
  Administered 2016-09-26: 10 mg via INTRAVENOUS
  Administered 2016-09-26: 40 mg via INTRAVENOUS

## 2016-09-26 MED ORDER — LACTATED RINGERS IV SOLN
INTRAVENOUS | Status: DC
  Administered 2016-09-26 (×2): via INTRAVENOUS

## 2016-09-26 MED ORDER — SUCCINYLCHOLINE CHLORIDE 200 MG/10ML IV SOSY
PREFILLED_SYRINGE | INTRAVENOUS | Status: AC
  Filled 2016-09-26: qty 10

## 2016-09-26 MED ORDER — LABETALOL HCL 5 MG/ML IV SOLN
INTRAVENOUS | Status: AC
  Filled 2016-09-26: qty 4

## 2016-09-26 MED ORDER — FENTANYL CITRATE (PF) 100 MCG/2ML IJ SOLN
INTRAMUSCULAR | Status: DC | PRN
  Administered 2016-09-26 (×3): 50 ug via INTRAVENOUS

## 2016-09-26 MED ORDER — BUPIVACAINE HCL (PF) 0.25 % IJ SOLN
INTRAMUSCULAR | Status: AC
  Filled 2016-09-26: qty 30

## 2016-09-26 MED ORDER — KETOROLAC TROMETHAMINE 30 MG/ML IJ SOLN
INTRAMUSCULAR | Status: DC | PRN
  Administered 2016-09-26: 30 mg via INTRAVENOUS

## 2016-09-26 MED ORDER — FENTANYL CITRATE (PF) 100 MCG/2ML IJ SOLN: 25.0000 ug | INTRAMUSCULAR | Status: DC | PRN

## 2016-09-26 MED ORDER — ACETAMINOPHEN 650 MG RE SUPP
650.0000 mg | RECTAL | Status: DC | PRN
  Filled 2016-09-26: qty 1

## 2016-09-26 MED ORDER — SODIUM CHLORIDE 0.9 % IV SOLN: INTRAVENOUS | Status: DC

## 2016-09-26 MED ORDER — FENTANYL CITRATE (PF) 250 MCG/5ML IJ SOLN
INTRAMUSCULAR | Status: AC
  Filled 2016-09-26: qty 5

## 2016-09-26 MED ORDER — SUCCINYLCHOLINE CHLORIDE 200 MG/10ML IV SOSY
PREFILLED_SYRINGE | INTRAVENOUS | Status: DC | PRN
  Administered 2016-09-26: 120 mg via INTRAVENOUS

## 2016-09-26 MED ORDER — LIDOCAINE 2% (20 MG/ML) 5 ML SYRINGE
INTRAMUSCULAR | Status: DC | PRN
  Administered 2016-09-26: 60 mg via INTRAVENOUS

## 2016-09-26 MED ORDER — PROPOFOL 10 MG/ML IV BOLUS
INTRAVENOUS | Status: AC
  Filled 2016-09-26: qty 20

## 2016-09-26 MED ORDER — GABAPENTIN 300 MG PO CAPS
300.0000 mg | ORAL_CAPSULE | ORAL | Status: AC
  Administered 2016-09-26: 300 mg via ORAL
  Filled 2016-09-26: qty 1

## 2016-09-26 MED ORDER — DEXAMETHASONE SODIUM PHOSPHATE 10 MG/ML IJ SOLN
INTRAMUSCULAR | Status: AC
  Filled 2016-09-26: qty 1

## 2016-09-26 MED ORDER — SODIUM CHLORIDE 0.9 % IV SOLN: 250.0000 mL | INTRAVENOUS | Status: DC | PRN

## 2016-09-26 MED ORDER — SUGAMMADEX SODIUM 200 MG/2ML IV SOLN
INTRAVENOUS | Status: AC
  Filled 2016-09-26: qty 2

## 2016-09-26 MED ORDER — ROCURONIUM BROMIDE 50 MG/5ML IV SOSY
PREFILLED_SYRINGE | INTRAVENOUS | Status: AC
  Filled 2016-09-26: qty 5

## 2016-09-26 MED ORDER — SODIUM CHLORIDE 0.9% FLUSH: 3.0000 mL | Freq: Two times a day (BID) | INTRAVENOUS | Status: DC

## 2016-09-26 MED ORDER — 0.9 % SODIUM CHLORIDE (POUR BTL) OPTIME
TOPICAL | Status: DC | PRN
Start: 2016-09-26 — End: 2016-09-26
  Administered 2016-09-26: 1000 mL

## 2016-09-26 MED ORDER — OXYCODONE HCL 5 MG PO TABS: 5.0000 mg | ORAL_TABLET | ORAL | 0 refills | Status: DC | PRN

## 2016-09-26 MED ORDER — OXYCODONE HCL 5 MG PO TABS
5.0000 mg | ORAL_TABLET | ORAL | Status: DC | PRN
  Administered 2016-09-26: 5 mg via ORAL
  Filled 2016-09-26: qty 1

## 2016-09-26 MED ORDER — SODIUM CHLORIDE 0.9% FLUSH: 3.0000 mL | INTRAVENOUS | Status: DC | PRN

## 2016-09-26 MED ORDER — DEXAMETHASONE SODIUM PHOSPHATE 10 MG/ML IJ SOLN
INTRAMUSCULAR | Status: DC | PRN
  Administered 2016-09-26: 10 mg via INTRAVENOUS

## 2016-09-26 MED ORDER — MIDAZOLAM HCL 2 MG/2ML IJ SOLN
INTRAMUSCULAR | Status: DC | PRN
  Administered 2016-09-26: 2 mg via INTRAVENOUS

## 2016-09-26 MED ORDER — ACETAMINOPHEN 500 MG PO TABS
1000.0000 mg | ORAL_TABLET | Freq: Four times a day (QID) | ORAL | Status: DC
  Filled 2016-09-26 (×4): qty 2

## 2016-09-26 MED ORDER — HYDROMORPHONE HCL 1 MG/ML IJ SOLN
INTRAMUSCULAR | Status: AC
  Filled 2016-09-26: qty 1

## 2016-09-26 MED ORDER — LABETALOL HCL 5 MG/ML IV SOLN
INTRAVENOUS | Status: DC | PRN
  Administered 2016-09-26 (×2): 5 mg via INTRAVENOUS

## 2016-09-26 MED ORDER — PROPOFOL 10 MG/ML IV BOLUS
INTRAVENOUS | Status: DC | PRN
  Administered 2016-09-26: 200 mg via INTRAVENOUS

## 2016-09-26 MED ORDER — ACETAMINOPHEN 500 MG PO TABS
1000.0000 mg | ORAL_TABLET | ORAL | Status: AC
  Administered 2016-09-26: 1000 mg via ORAL
  Filled 2016-09-26: qty 2

## 2016-09-26 MED ORDER — SUGAMMADEX SODIUM 200 MG/2ML IV SOLN
INTRAVENOUS | Status: DC | PRN
  Administered 2016-09-26: 200 mg via INTRAVENOUS

## 2016-09-26 MED ORDER — MIDAZOLAM HCL 2 MG/2ML IJ SOLN
INTRAMUSCULAR | Status: AC
  Filled 2016-09-26: qty 2

## 2016-09-26 MED ORDER — HYDROMORPHONE HCL 1 MG/ML IJ SOLN
0.2500 mg | INTRAMUSCULAR | Status: DC | PRN
  Administered 2016-09-26: 0.5 mg via INTRAVENOUS

## 2016-09-26 MED ORDER — BUPIVACAINE-EPINEPHRINE (PF) 0.5% -1:200000 IJ SOLN
INTRAMUSCULAR | Status: AC
  Filled 2016-09-26: qty 30

## 2016-09-26 MED ORDER — LIDOCAINE 2% (20 MG/ML) 5 ML SYRINGE
INTRAMUSCULAR | Status: AC
  Filled 2016-09-26: qty 5

## 2016-09-26 MED ORDER — CHLORHEXIDINE GLUCONATE CLOTH 2 % EX PADS: 6.0000 | MEDICATED_PAD | Freq: Once | CUTANEOUS | Status: DC

## 2016-09-26 MED ORDER — ONDANSETRON HCL 4 MG/2ML IJ SOLN
INTRAMUSCULAR | Status: AC
  Filled 2016-09-26: qty 2

## 2016-09-26 MED ORDER — PHENYLEPHRINE 40 MCG/ML (10ML) SYRINGE FOR IV PUSH (FOR BLOOD PRESSURE SUPPORT)
PREFILLED_SYRINGE | INTRAVENOUS | Status: AC
  Filled 2016-09-26: qty 10

## 2016-09-26 MED ORDER — BUPIVACAINE-EPINEPHRINE (PF) 0.5% -1:200000 IJ SOLN
INTRAMUSCULAR | Status: DC | PRN
  Administered 2016-09-26: 30 mL

## 2016-09-26 MED ORDER — ACETAMINOPHEN 325 MG PO TABS: 650.0000 mg | ORAL_TABLET | ORAL | Status: DC | PRN

## 2016-09-26 MED ORDER — CEFAZOLIN SODIUM-DEXTROSE 2-4 GM/100ML-% IV SOLN
2.0000 g | INTRAVENOUS | Status: AC
  Administered 2016-09-26: 2 g via INTRAVENOUS
  Filled 2016-09-26: qty 100

## 2016-09-26 MED ORDER — ONDANSETRON HCL 4 MG/2ML IJ SOLN
INTRAMUSCULAR | Status: DC | PRN
  Administered 2016-09-26: 4 mg via INTRAVENOUS

## 2016-09-26 SURGICAL SUPPLY — 45 items
APPLIER CLIP LOGIC TI 5 (MISCELLANEOUS) IMPLANT
BENZOIN TINCTURE PRP APPL 2/3 (GAUZE/BANDAGES/DRESSINGS) ×3 IMPLANT
CABLE HIGH FREQUENCY MONO STRZ (ELECTRODE) ×3 IMPLANT
CHLORAPREP W/TINT 26ML (MISCELLANEOUS) ×3 IMPLANT
CLOSURE WOUND 1/2 X4 (GAUZE/BANDAGES/DRESSINGS) ×1
COVER SURGICAL LIGHT HANDLE (MISCELLANEOUS) IMPLANT
DERMABOND ADVANCED (GAUZE/BANDAGES/DRESSINGS)
DERMABOND ADVANCED .7 DNX12 (GAUZE/BANDAGES/DRESSINGS) IMPLANT
DEVICE SECURE STRAP 25 ABSORB (INSTRUMENTS) IMPLANT
DEVICE TROCAR PUNCTURE CLOSURE (ENDOMECHANICALS) ×3 IMPLANT
DRAIN CHANNEL 19F RND (DRAIN) IMPLANT
DRAPE INCISE IOBAN 66X45 STRL (DRAPES) IMPLANT
DRAPE UTILITY XL STRL (DRAPES) IMPLANT
ELECT PENCIL ROCKER SW 15FT (MISCELLANEOUS) ×3 IMPLANT
ELECT REM PT RETURN 9FT ADLT (ELECTROSURGICAL) ×3
ELECTRODE REM PT RTRN 9FT ADLT (ELECTROSURGICAL) ×1 IMPLANT
EVACUATOR SILICONE 100CC (DRAIN) IMPLANT
GAUZE SPONGE 2X2 8PLY STRL LF (GAUZE/BANDAGES/DRESSINGS) ×1 IMPLANT
GOWN STRL REUS W/TWL XL LVL3 (GOWN DISPOSABLE) ×9 IMPLANT
IRRIG SUCT STRYKERFLOW 2 WTIP (MISCELLANEOUS)
IRRIGATION SUCT STRKRFLW 2 WTP (MISCELLANEOUS) IMPLANT
KIT BASIN OR (CUSTOM PROCEDURE TRAY) ×3 IMPLANT
L-HOOK LAP DISP 36CM (ELECTROSURGICAL)
LHOOK LAP DISP 36CM (ELECTROSURGICAL) IMPLANT
MARKER SKIN DUAL TIP RULER LAB (MISCELLANEOUS) ×3 IMPLANT
MESH VENTRALEX ST 8CM LRG (Mesh General) ×3 IMPLANT
NEEDLE SPNL 22GX3.5 QUINCKE BK (NEEDLE) ×3 IMPLANT
PAD POSITIONING PINK XL (MISCELLANEOUS) IMPLANT
SCISSORS LAP 5X35 DISP (ENDOMECHANICALS) ×3 IMPLANT
SHEARS HARMONIC ACE PLUS 36CM (ENDOMECHANICALS) IMPLANT
SLEEVE XCEL OPT CAN 5 100 (ENDOMECHANICALS) ×3 IMPLANT
SPONGE GAUZE 2X2 STER 10/PKG (GAUZE/BANDAGES/DRESSINGS) ×2
STRIP CLOSURE SKIN 1/2X4 (GAUZE/BANDAGES/DRESSINGS) ×2 IMPLANT
SUT ETHILON 2 0 PS N (SUTURE) IMPLANT
SUT MNCRL AB 4-0 PS2 18 (SUTURE) ×3 IMPLANT
SUT NOVA NAB GS-21 0 18 T12 DT (SUTURE) ×9 IMPLANT
SUT VIC AB 3-0 SH 18 (SUTURE) ×3 IMPLANT
TOWEL OR 17X26 10 PK STRL BLUE (TOWEL DISPOSABLE) ×3 IMPLANT
TOWEL OR NON WOVEN STRL DISP B (DISPOSABLE) ×3 IMPLANT
TRAY FOLEY W/METER SILVER 14FR (SET/KITS/TRAYS/PACK) IMPLANT
TRAY FOLEY W/METER SILVER 16FR (SET/KITS/TRAYS/PACK) IMPLANT
TRAY LAPAROSCOPIC (CUSTOM PROCEDURE TRAY) ×3 IMPLANT
TROCAR BLADELESS OPT 5 100 (ENDOMECHANICALS) ×3 IMPLANT
TROCAR XCEL NON-BLD 11X100MML (ENDOMECHANICALS) IMPLANT
TUBING INSUF HEATED (TUBING) ×3 IMPLANT

## 2016-09-26 NOTE — Anesthesia Preprocedure Evaluation (Signed)
Anesthesia Evaluation  Patient identified by MRN, date of birth, ID band Patient awake  General Assessment Comment:History noted. CG  Reviewed: Allergy & Precautions, NPO status , Patient's Chart, lab work & pertinent test results  Airway Mallampati: II  TM Distance: >3 FB     Dental   Pulmonary neg pulmonary ROS,    breath sounds clear to auscultation       Cardiovascular negative cardio ROS   Rhythm:Regular Rate:Normal     Neuro/Psych negative neurological ROS     GI/Hepatic negative GI ROS, Neg liver ROS,   Endo/Other    Renal/GU negative Renal ROS     Musculoskeletal   Abdominal   Peds  Hematology   Anesthesia Other Findings   Reproductive/Obstetrics                             Anesthesia Physical Anesthesia Plan  ASA: II  Anesthesia Plan: General   Post-op Pain Management:    Induction: Intravenous  Airway Management Planned: Oral ETT  Additional Equipment:   Intra-op Plan:   Post-operative Plan: Extubation in OR  Informed Consent: I have reviewed the patients History and Physical, chart, labs and discussed the procedure including the risks, benefits and alternatives for the proposed anesthesia with the patient or authorized representative who has indicated his/her understanding and acceptance.   Dental advisory given  Plan Discussed with: CRNA and Anesthesiologist  Anesthesia Plan Comments:         Anesthesia Quick Evaluation

## 2016-09-26 NOTE — Anesthesia Postprocedure Evaluation (Signed)
Anesthesia Post Note  Patient: Brandon Reyes  Procedure(s) Performed: Procedure(s) (LRB): LAPAROSCOPIC ASSISTED UMBILICAL HERNIA REPAIR WITH MESH (N/A) INSERTION OF MESH (N/A)  Patient location during evaluation: PACU Anesthesia Type: General Level of consciousness: awake Pain management: pain level controlled Vital Signs Assessment: post-procedure vital signs reviewed and stable Respiratory status: spontaneous breathing Cardiovascular status: stable Anesthetic complications: no       Last Vitals:  Vitals:   09/26/16 1220 09/26/16 1230  BP:  124/81  Pulse: 75 79  Resp: 16 15  Temp:  36.6 C    Last Pain:  Vitals:   09/26/16 1215  TempSrc:   PainSc: Asleep                 Carrah Eppolito

## 2016-09-26 NOTE — Interval H&P Note (Signed)
History and Physical Interval Note:  09/26/2016 9:34 AM  Brandon Reyes  has presented today for surgery, with the diagnosis of Umbilical Hernia  The various methods of treatment have been discussed with the patient and family. After consideration of risks, benefits and other options for treatment, the patient has consented to  Procedure(s): [place] (N/A) INSERTION OF MESH (N/A) as a surgical intervention .  The patient's history has been reviewed, patient examined, no change in status, stable for surgery.  I have reviewed the patient's chart and labs.  Questions were answered to the patient's satisfaction.    Leighton Ruff. Redmond Pulling, MD, [place], Bariatric, & Minimally Invasive Surgery [facility], [place]   [facility]

## 2016-09-26 NOTE — Anesthesia Procedure Notes (Signed)
Procedure Name: Intubation Date/Time: 09/26/2016 10:02 AM Performed by: Dione Booze Pre-anesthesia Checklist: Suction available, Emergency Drugs available, Patient being monitored and Patient identified Patient Re-evaluated:Patient Re-evaluated prior to inductionOxygen Delivery Method: Circle system utilized Preoxygenation: Pre-oxygenation with 100% oxygen Intubation Type: IV induction Laryngoscope Size: Mac and 4 Grade View: Grade II Tube type: Oral Tube size: 7.5 mm Number of attempts: 1 Airway Equipment and Method: Stylet Placement Confirmation: ETT inserted through vocal cords under direct vision,  positive ETCO2 and breath sounds checked- equal and bilateral Secured at: 22 cm Tube secured with: Tape Dental Injury: Teeth and Oropharynx as per pre-operative assessment

## 2016-09-26 NOTE — Transfer of Care (Signed)
Immediate Anesthesia Transfer of Care Note  Patient: Brandon Reyes  Procedure(s) Performed: Procedure(s): LAPAROSCOPIC ASSISTED UMBILICAL HERNIA REPAIR WITH MESH (N/A) INSERTION OF MESH (N/A)  Patient Location: PACU  Anesthesia Type:General  Level of Consciousness: awake, alert  and patient cooperative  Airway & Oxygen Therapy: Patient Spontanous Breathing and Patient connected to face mask oxygen  Post-op Assessment: Report given to RN and Post -op Vital signs reviewed and stable  Post vital signs: Reviewed and stable  Last Vitals:  Vitals:   09/26/16 0740  BP: 130/89  Pulse: 85  Resp: 18  Temp: 36.6 C    Last Pain:  Vitals:   09/26/16 0740  TempSrc: Oral      Patients Stated Pain Goal: 4 ([id number])  Complications: No apparent anesthesia complications

## 2016-09-26 NOTE — Discharge Instructions (Signed)
General Anesthesia, Adult, Care After These instructions provide you with information about caring for yourself after your procedure. Your health care provider may also give you more specific instructions. Your treatment has been planned according to current medical practices, but problems sometimes occur. Call your health care provider if you have any problems or questions after your procedure. What can I expect after the procedure? After the procedure, it is common to have:  Vomiting.  A sore throat.  Mental slowness. It is common to feel:  Nauseous.  Cold or shivery.  Sleepy.  Tired.  Sore or achy, even in parts of your body where you did not have surgery. Follow these instructions at home: For at least 24 hours after the procedure:  Do not:  Participate in activities where you could fall or become injured.  Drive.  Use heavy machinery.  Drink alcohol.  Take sleeping pills or medicines that cause drowsiness.  Make important decisions or sign legal documents.  Take care of children on your own.  Rest. Eating and drinking  If you vomit, drink water, juice, or soup when you can drink without vomiting.  Drink enough fluid to keep your urine clear or pale yellow.  Make sure you have little or no nausea before eating solid foods.  Follow the diet recommended by your health care provider. General instructions  Have a responsible adult stay with you until you are awake and alert.  Return to your normal activities as told by your health care provider. Ask your health care provider what activities are safe for you.  Take over-the-counter and prescription medicines only as told by your health care provider.  If you smoke, do not smoke without supervision.  Keep all follow-up visits as told by your health care provider. This is important. Contact a health care provider if:  You continue to have nausea or vomiting at home, and medicines are not helpful.  You  cannot drink fluids or start eating again.  You cannot urinate after 8-12 hours.  You develop a skin rash.  You have fever.  You have increasing redness at the site of your procedure. Get help right away if:  You have difficulty breathing.  You have chest pain.  You have unexpected bleeding.  You feel that you are having a life-threatening or urgent problem. This information is not intended to replace advice given to you by your health care provider. Make sure you discuss any questions you have with your health care provider. Document Released: 10/24/2000 Document Revised: 12/21/2015 Document Reviewed: 07/02/2015 Elsevier Interactive Patient Education  2017 [facility], [place]  UMBILICAL OR INGUINAL HERNIA REPAIR: POST OP INSTRUCTIONS  Always review your discharge instruction sheet given to you by the facility where your surgery was performed. IF YOU HAVE DISABILITY OR FAMILY LEAVE FORMS, YOU MUST BRING THEM TO THE OFFICE FOR PROCESSING.   DO NOT GIVE THEM TO YOUR DOCTOR.  1. A  prescription for pain medication may be given to you upon discharge.  Take your pain medication as prescribed, if needed.  If narcotic pain medicine is not needed, then you may take acetaminophen (Tylenol) &/or ibuprofen (Advil) as needed. 2. Take your usually prescribed medications unless otherwise directed. 3. If you need a refill on your pain medication, please contact your pharmacy.  They will contact our office to request authorization. Prescriptions will not be filled after 5 pm or on week-ends. 4. You should follow a light diet the first 24 hours after  arrival home, such as soup and crackers, etc.  Be sure to include lots of fluids daily.  Resume your normal diet the day after surgery. 5. Most patients will experience some swelling and bruising around the umbilicus or in the groin and scrotum.  Ice packs and reclining will help.  Swelling and bruising can take several days  to resolve.  6. It is common to experience some constipation if taking pain medication after surgery.  Increasing fluid intake and taking a stool softener (such as Colace) will usually help or prevent this problem from occurring.  A mild laxative (Milk of Magnesia or Miralax) should be taken according to package directions if there are no bowel movements after 48 hours. 7. Unless discharge instructions indicate otherwise, you may remove your bandages 48 hours after surgery, and you may shower at that time.  You  have steri-strips (small skin tapes) in place directly over the incision.  These strips should be left on the skin for 7-10 days.  8. ACTIVITIES:  You may resume regular (light) daily activities beginning the next day--such as daily self-care, walking, climbing stairs--gradually increasing activities as tolerated.  You may have sexual intercourse when it is comfortable.  Refrain from any heavy lifting or straining until approved by your doctor. a. You may drive when you are no longer taking prescription pain medication, you can comfortably wear a seatbelt, and you can safely maneuver your car and apply brakes. b. RETURN TO WORK:  9. You should see your doctor in the office for a follow-up appointment approximately 2-3 weeks after your surgery.  Make sure that you call for this appointment within a day or two after you arrive home to insure a convenient appointment time. 10. OTHER INSTRUCTIONS: DO NOT LIFT, PUSH, PULL ANYTHING GREATER THAN 10 LBS FOR AT LEAST 4 WEEKS    WHEN TO CALL YOUR DOCTOR: 1. Fever over 101.0 2. Inability to urinate 3. Nausea and/or vomiting 4. Extreme swelling or bruising 5. Continued bleeding from incision. 6. Increased pain, redness, or drainage from the incision  The clinic staff is available to answer your questions during regular business hours.  Please dont hesitate to call and ask to speak to one of the nurses for clinical concerns.  If you have a medical  emergency, go to the nearest emergency room or call 911.  A surgeon from [facility] is always on call at the hospital   [address] [place], [place], [place]  [address] ?  [address], [place], [place]   [id number] [phone] ? [phone] ? FAX [phone] Web site: [email or web address]

## 2016-09-26 NOTE — Op Note (Addendum)
Brandon Reyes [id number] 09/26/2016   Laparoscopic Assisted Umbilical Herniorrhaphy with Mesh Procedure Note  Indications: Symptomatic umbilical hernia   Pre-operative Diagnosis: umbilical hernia   Post-operative Diagnosis: umbilical hernia   Surgeon: Leighton Ruff. Redmond Pulling, MD FACS   Assistants: none   Anesthesia: General endotracheal anesthesia and Local anesthesia 0.25%marcaine with epi   Procedure Details  The patient was seen in the Holding Room. The risks, benefits, complications, treatment options, and expected outcomes were discussed with the patient. The possibilities of reaction to medication, pulmonary aspiration, perforation of viscus, bleeding, recurrent infection, hernia recurrence, the need for additional procedures, failure to diagnose a condition, and creating a complication requiring transfusion or operation were discussed with the patient. The patient concurred with the proposed plan, giving informed consent. The site of surgery properly noted/marked.   The patient was taken to Operating Room # 1 at [facility], identified as Brandon Reyes  and the procedure verified as Umbilical Herniorrhaphy. A Time Out was held and the above information confirmed. The patient received IV antibiotics and ERAS meds prior to the procedure.   The patient was placed supine. After establishing general anesthesia, the abdomen was prepped and draped in standard fashion.   A 5 mm Optiview was used the cannulate the peritoneal cavity in the left upper quadrant below the costal margin.  Pneumoperitoneum was obtained by insufflating CO2, maintaining a maximum pressure of 15 mmHg.  The 5 mm 30-degree laparoscopic was inserted.  There were no significant omental adhesions to the anterior abdominal wall in and around the umbilical hernia defect.  An 5-mm port was placed in the left anterior axillary line at the level of the umbilicus.  Endoshears with cautery was used to takedown part of the falciform ligament  since it came down to the fascial defect.  We cleared the entire abdominal wall and were able to visualize 1 fascial defects. We used a spinal needle to identify the extent of the hernia defects.  This covered an area of about 2x 2 cms.  We selected a round piece of BardVentralightST hernia patch mesh.  We placed 4 stay sutures of 0 Novofil around the edges of the mesh and 1 central stay suture.    0.250% Marcaine with epinephrine was used to anesthetize the skin. A curvilinear infraumbilical incision was created. Dissection was carried down to the hernia sac located above the fascia and was mobilized from surrounding structures. Intact fascia was identified circumferentially around the defect. Skin and soft tissue was mobilized from the surface of the fascia in a circumferential manner. A finger sweep was performed underneath the fascia to ensure there were no adhesions to the anterior abdominal wall.  The mesh was then rolled up and inserted through the fascial defect.  The fascia was then closed primarily over the mesh with five interrupted 0-novafil sutures. We then returned laparoscopically. The mesh was then unrolled.  The five stay sutures were then pulled up through small stab incisions using the Endo-close device.  This deployed the mesh widely over the fascial defect.  The stay sutures were then tied down.   The umbilical incision cavity was irrigated and additional local was infiltrated in the subcutaneous tissue and fascia. The umbilical stalk was then tacked back down to the fascia with two 3-0 vicryl sutures. Hemostasis was confirmed. The soft tissue was irrigated and closed in layers with inverted interrupted 3-0 vicryl sutures for the deep dermis. The skin incision was closed with a 4-0 monocryl subcuticular closure. Steri-Strips followed by  a 4x4 gauze and a tegaderm were applied at the end of the operation.   Instrument, sponge, and needle counts were correct prior to closure and at the  conclusion of the case.   Findings:  A 2 cm fascial defect  Type of repair - primary suture with mesh underlay  (choices - primary suture, mesh, or component)  Name of mesh - bard ventralexST hernia patch  Size of mesh - round 8cm  Mesh overlap - >4 cm  Placement of mesh - beneath fascia and into peritoneal cavity  (choices - beneath fascia and into peritoneal cavity, beneath fascia but external to peritoneal cavity, between the muscle and fascia, above or external to fascia)  Estimated Blood Loss: Minimal   Drains: none   Implants: see above   Complications: None; patient tolerated the procedure well.   Disposition: PACU - hemodynamically stable. I reviewed the [place] controlled substance registry and there were no signs/patterns of abuse noticed.   Condition: stable  Leighton Ruff. Redmond Pulling, MD, FACS General, Bariatric, & Minimally Invasive Surgery [facility], [place]

## 2016-09-26 NOTE — H&P (Signed)
Brandon Reyes 09/07/2016 8:37 AM Location: [facility] Patient #: [id number] DOB: 1960/11/07 Married / Language: English / Race: White Male   History of Present Illness Brandon Hiss M. Juanluis Guastella MD; 09/07/2016 8:58 AM) The patient is a 56 year old male who presents with an umbilical hernia. He comes back in to discuss hernia surgery. I initially met him last year. He states that his hernia has gotten larger. It causes some intermittent discomfort. It has never been hard or firm. He denies any nausea or vomiting. He denies any diarrhea or constipation. He denies any tobacco or alcohol use. He is currently out of work because he is still recovering from an ankle fracture that he had a few months ago. He denies any chest pain, chest pressure, shortness of breath, TIAs or amaurosis fugax  10/29/2015 He is referred by Brandon Reyes nurse practitioner for evaluation of umbilical hernia. He went to donate plasma and the umbilical hernia was detected and he was sent to his PCPs office and was then referred here. He states that he has noticed a bulge at his umbilicus for about 2 years. It has gotten larger. It does not cause him any pain or discomfort. He denies any nausea, vomiting, diarrhea or constipation. He denies any prior abdominal surgery. He denies smoking. He denies any chest pain, chest pressure, shortness of breath, dizziness on exertion   Problem List/Past Medical Brandon Hiss M. Redmond Pulling, MD; 09/29/4968 2:63 AM) UMBILICAL HERNIA WITHOUT OBSTRUCTION AND WITHOUT GANGRENE (K42.9)   Past Surgical History Brandon Hiss M. Redmond Pulling, MD; 09/07/2016 8:59 AM) No pertinent past surgical history   Diagnostic Studies History Brandon Hiss M. Redmond Pulling, MD; 09/07/2016 8:59 AM) Colonoscopy  never  Allergies Brandon Reyes, [name]; 09/07/2016 8:38 AM) No Known Drug Allergies 10/29/2015  Medication History Brandon Reyes, [name]; 09/07/2016 8:38 AM) No Current Medications (Taken starting 09/07/2016) Medications  Reconciled  Social History Brandon Hiss M. Redmond Pulling, MD; 09/07/2016 8:59 AM) No alcohol use  No drug use  Tobacco use  Never smoker.  Family History Brandon Hiss M. Redmond Pulling, MD; 09/07/2016 8:59 AM) First Degree Relatives  No pertinent family history   Other Problems Brandon Hiss M. Redmond Pulling, MD; 09/07/2016 8:59 AM) No pertinent past medical history     Review of Systems Brandon Hiss M. Cybil Senegal MD; 09/07/2016 8:57 AM) General Not Present- Appetite Loss, Chills, Fatigue, Fever, Night Sweats, Weight Gain and Weight Loss. Skin Not Present- Change in Wart/Mole, Dryness, Hives, Jaundice, New Lesions, Non-Healing Wounds, Rash and Ulcer. HEENT Not Present- Earache, Hearing Loss, Hoarseness, Nose Bleed, Oral Ulcers, Ringing in the Ears, Seasonal Allergies, Sinus Pain, Sore Throat, Visual Disturbances, Wears glasses/contact lenses and Yellow Eyes. Respiratory Not Present- Bloody sputum, Chronic Cough, Difficulty Breathing, Snoring and Wheezing. Breast Not Present- Breast Mass, Breast Pain, Nipple Discharge and Skin Changes. Cardiovascular Not Present- Chest Pain, Difficulty Breathing Lying Down, Leg Cramps, Palpitations, Rapid Heart Rate, Shortness of Breath and Swelling of Extremities. Gastrointestinal Not Present- Abdominal Pain, Bloating, Bloody Stool, Change in Bowel Habits, Chronic diarrhea, Constipation, Difficulty Swallowing, Excessive gas, Gets full quickly at meals, Hemorrhoids, Indigestion, Nausea, Rectal Pain and Vomiting. Male Genitourinary Not Present- Blood in Urine, Change in Urinary Stream, Frequency, Impotence, Nocturia, Painful Urination, Urgency and Urine Leakage. Musculoskeletal Not Present- Back Pain, Joint Pain, Joint Stiffness, Muscle Pain, Muscle Weakness and Swelling of Extremities. Neurological Not Present- Decreased Memory, Fainting, Headaches, Numbness, Seizures, Tingling, Tremor, Trouble walking and Weakness. Psychiatric Not Present- Anxiety, Bipolar, Change in Sleep Pattern, Depression, Fearful and  Frequent crying. Endocrine Not Present- Cold Intolerance, Excessive Hunger,  Hair Changes, Heat Intolerance and New Diabetes. Hematology Not Present- Easy Bruising, Excessive bleeding, Gland problems, HIV and Persistent Infections.  Vitals Brandon Reyes CMA; 09/07/2016 8:39 AM) 09/07/2016 8:38 AM Weight: 220 lb Height: 65in Body Surface Area: 2.06 m Body Mass Index: 36.61 kg/m  Temp.: 98.52F  Pulse: 98 (Regular)  BP: 120/78 (Sitting, Left Arm, Standard)       Physical Exam Brandon Hiss M. Christopherjohn Schiele MD; 09/07/2016 8:57 AM) General Mental Status-Alert. General Appearance-Consistent with stated age. Hydration-Well hydrated. Voice-Normal. Note: Obese   Head and Neck Head-normocephalic, atraumatic with no lesions or palpable masses. Trachea-midline. Thyroid Gland Characteristics - normal size and consistency.  Eye Eyeball - Bilateral-Extraocular movements intact. Sclera/Conjunctiva - Bilateral-No scleral icterus.  Chest and Lung Exam Chest and lung exam reveals -quiet, even and easy respiratory effort with no use of accessory muscles and on auscultation, normal breath sounds, no adventitious sounds and normal vocal resonance. Inspection Chest Wall - Normal. Back - normal.  Breast - Did not examine.  Cardiovascular Cardiovascular examination reveals -normal heart sounds, regular rate and rhythm with no murmurs and normal pedal pulses bilaterally.  Abdomen Inspection  Inspection of the abdomen reveals: Note: Obvious umbilical hernia. Has a little bit of redundant skin. Reducible, soft, nontender. Defect is around 2 cm x 2 cm. Skin - Scar - no surgical scars. Palpation/Percussion Palpation and Percussion of the abdomen reveal - Soft, Non Tender, No Rebound tenderness, No Rigidity (guarding) and No hepatosplenomegaly. Auscultation Auscultation of the abdomen reveals - Bowel sounds normal.  Peripheral Vascular Upper Extremity Palpation - Pulses  bilaterally normal.  Neurologic Neurologic evaluation reveals -alert and oriented x 3 with no impairment of recent or remote memory. Mental Status-Normal.  Neuropsychiatric The patient's mood and affect are described as -normal. Judgment and Insight-insight is appropriate concerning matters relevant to self.  Musculoskeletal Normal Exam - Left-Upper Extremity Strength Normal and Lower Extremity Strength Normal. Normal Exam - Right-Upper Extremity Strength Normal and Lower Extremity Strength Normal.  Lymphatic Head & Neck  General Head & Neck Lymphatics: Bilateral - Description - Normal. Axillary - Did not examine. Femoral & Inguinal - Did not examine.    Assessment & Plan Brandon Hiss M. [place] Falls MD; 10/07/4534 4:68 AM) UMBILICAL HERNIA WITHOUT OBSTRUCTION AND WITHOUT GANGRENE (K42.9) Impression: We rediscussed the etiology of umbilical hernias. We discussed the signs and symptoms of incarceration and strangulation. The patient was given educational material. I also drew diagrams.  We discussed nonoperative and operative management. With respect to operative management, we rediscussed open repair with laparoscopic assistance. (close muscle primarily with mesh underlay using laparoscopic assistance)  We discussed the risk and benefits of surgery including but not limited to bleeding, infection, injury to surrounding structures, hernia recurrence, mesh complications, hematoma/seroma formation, blood clot formation, urinary retention, post operative ileus, general anesthesia risk, abdominal pain. We discussed the importance of avoiding heavy lifting and straining for a period of 6 weeks.  The patient has elected to proceed with [place] with MESH.  Our schedulers will contact him to schedule surgery in the future. We discussed in the interim trying to work on his weight Current Plans Pt Education - Pamphlet Given - Hernia Surgery: discussed  with patient and provided information. You are being scheduled for surgery- Our schedulers will call you.  You should hear from our office's scheduling department within 5 working days about the location, date, and time of surgery. We try to make accommodations for patient's preferences in scheduling surgery, but sometimes the  OR schedule or the surgeon's schedule prevents [place] from making those accommodations.  If you have not heard from our office [phone]) in 5 working days, call the office and ask for your surgeon's nurse.  If you have other questions about your diagnosis, plan, or surgery, call the office and ask for your surgeon's nurse. Leighton Ruff. Redmond Pulling, MD, FACS General, Bariatric, & Minimally Invasive Surgery [facility], [place]

## 2016-11-16 NOTE — Progress Notes (Signed)
[name] [address] [place], [place] [address] Phone: [phone] Subjective:    I'm seeing this patient by the request  of:    CC: Left ankle pain  [id number]  Brandon Reyes is a 56 y.o. male coming in with complaint of left ankle pain. Patient did have a fracture the tibia and proximal fibula back in November 2016 that needed surgery.. Patient though unfortunately had a recurrent injury in December 2017. Moderate left ankle pain continues to occur. Patient states that he is unable to work. Standing for greater than an hour or any walking greater than 200 feet, significant amount pain. Ambulates with the aid of a cane. States that he does too much activity all night he is awake with severe amount pain. Patient has gotten pain from another provider in does respond somewhat to oxycodone. States that this is associated with swelling. Denies numbness.   patient did have x-rays taken 07/30/2016. Patient did have an ORIF half noted with no significant loosening. There is arthritic changes of significant posttraumatic of the ankle joint possible intra-articular loose bodies noted as well.  Past Medical History:  Diagnosis Date  . Arthritis    Past Surgical History:  Procedure Laterality Date  . ANKLE FRACTURE SURGERY    . INSERTION OF MESH N/A 09/26/2016   Procedure: INSERTION OF MESH;  Surgeon: Greer Pickerel, MD;  Location: [facility];  Service: General;  Laterality: N/A;  . UMBILICAL HERNIA REPAIR N/A 09/26/2016   Procedure: LAPAROSCOPIC ASSISTED UMBILICAL HERNIA REPAIR WITH MESH;  Surgeon: Greer Pickerel, MD;  Location: [facility];  Service: General;  Laterality: N/A;   Social History   Social History  . Marital status: Married    Spouse name: N/A  . Number of children: N/A  . Years of education: N/A   Social History Main Topics  . Smoking status: Never Smoker  . Smokeless tobacco: Never Used  . Alcohol use No  . Drug use: No  . Sexual activity: Not Asked     Other Topics Concern  . None   Social History Narrative  . None   No Known Allergies History reviewed. No pertinent family history.  Past medical history, social, surgical and family history all reviewed in electronic medical record.  No pertanent information unless stated regarding to the chief complaint.   Review of Systems:Review of systems updated and as accurate as of 11/17/16  No headache, visual changes, nausea, vomiting, diarrhea, constipation, dizziness, abdominal pain, skin rash, fevers, chills, night sweats, weight loss, swollen lymph nodes, body aches, joint swelling, muscle aches, chest pain, shortness of breath, mood changes.   Objective  Blood pressure (!) 130/100, pulse (!) 106, resp. rate 16, weight 219 lb (99.3 kg). Systems examined below as of 11/17/16   General: No apparent distress alert and oriented x3 mood and affect normal, dressed appropriately.  HEENT: Pupils equal, extraocular movements intact  Respiratory: Patient's speak in full sentences and does not appear short of breath  Cardiovascular: No lower extremity edema, non tender, no erythema  Skin: Warm dry intact with no signs of infection or rash on extremities or on axial skeleton.  Abdomen: Soft nontender  Neuro: Cranial nerves II through XII are intact, neurovascularly intact in all extremities with 2+ DTRs and 2+ pulses.  Lymph: No lymphadenopathy of posterior or anterior cervical chain or axillae bilaterally.  Gait severely antalgic gait. Uses a cane MSK:  Non tender with full range of motion and good stability and symmetric  strength and tone of shoulders, elbows, wrist, hip, knee and bilaterally.   Ankle: Left Large swelling Severe limitation in all planes 4 out of 5 strength in all planes Talar dome severely tender; Able to walk 4 steps. But severely antalgic  MSK US performed of: Left ankle This study was ordered, performed, and interpreted by Charlann Boxer D.O.  Foot/Ankle:   Joint  effusion noted with severe osteophytic changes of the entire ankle mortise.  IMPRESSION:  Severe ankle arthritis with joint effusion  Procedure: Real-time Ultrasound Guided Injection of right ankle joint Device: GE Logiq Q7 Ultrasound guided injection is preferred based studies that show increased duration, increased effect, greater accuracy, decreased procedural pain, increased response rate, and decreased cost with ultrasound guided versus blind injection.  Verbal informed consent obtained.  Time-out conducted.  Noted no overlying erythema, induration, or other signs of local infection.  Skin prepped in a sterile fashion.  Local anesthesia: Topical Ethyl chloride.  With sterile technique and under real time ultrasound guidance:  The 25-gauge 1 inch needle patient was injected with a total of 0.5 mL of 0.5% Marcaine and 0.5 mL of Kenalog 40 mg/dL into the ankle joint laterally. Completed without difficulty  Pain immediately resolved suggesting accurate placement of the medication.  Advised to call if fevers/chills, erythema, induration, drainage, or persistent bleeding.  Images permanently stored and available for review in the ultrasound unit.  Impression: Technically successful ultrasound guided injection.     Impression and Recommendations:     This case required medical decision making of moderate complexity.      Note: This dictation was prepared with Dragon dictation along with smaller phrase technology. Any transcriptional errors that result from this process are unintentional.       ;l

## 2016-11-17 ENCOUNTER — Ambulatory Visit: Payer: Self-pay

## 2016-11-17 ENCOUNTER — Other Ambulatory Visit: Payer: Self-pay

## 2016-11-17 ENCOUNTER — Ambulatory Visit (INDEPENDENT_AMBULATORY_CARE_PROVIDER_SITE_OTHER): Payer: 59 | Admitting: Family Medicine

## 2016-11-17 ENCOUNTER — Encounter: Payer: Self-pay | Admitting: Family Medicine

## 2016-11-17 VITALS — BP 130/100 | HR 106 | Resp 16 | Wt 219.0 lb

## 2016-11-17 DIAGNOSIS — M19072 Primary osteoarthritis, left ankle and foot: Secondary | ICD-10-CM | POA: Diagnosis not present

## 2016-11-17 DIAGNOSIS — M25572 Pain in left ankle and joints of left foot: Secondary | ICD-10-CM | POA: Diagnosis not present

## 2016-11-17 MED ORDER — VITAMIN D (ERGOCALCIFEROL) 1.25 MG (50000 UNIT) PO CAPS: 50000.0000 [IU] | ORAL_CAPSULE | ORAL | 0 refills | Status: DC

## 2016-11-17 MED ORDER — DICLOFENAC SODIUM 2 % TD SOLN: 2.0000 "application " | Freq: Two times a day (BID) | TRANSDERMAL | 3 refills | Status: DC

## 2016-11-17 NOTE — Assessment & Plan Note (Signed)
Patient was given an injection today. Has failed many different conservative therapies. Patient was given a postop boot to try. We discussed topical anti-inflammatories, over-the-counter medications, once weekly vitamin D to help with muscle strength and endurance. Discuss because the loose bodies there is a potential and surgical intervention as needed. Patient wants to avoid that at all cost at this time. We'll try conservative therapy with patient following up with me again in 4 weeks.

## 2016-11-17 NOTE — Patient Instructions (Signed)
Good to see you.  Ice 20 minutes 2 times daily. Usually after activity and before bed. Keep the ankle moving Once weekly vitamin D for 12 weeks.  pennsaid pinkie amount topically 2 times daily as needed.  Over the counter look for Turmeric 500mg  daily  Tart cherry extract any dose at night Consider wearing the shoe at work.  May need the other orthotics Look up a disability physician.  See me again in 6 weeks.

## 2016-11-17 NOTE — Progress Notes (Signed)
Pre-visit discussion using our clinic review tool. No additional management support is needed unless otherwise documented below in the visit note.  

## 2016-11-29 ENCOUNTER — Telehealth: Payer: Self-pay | Admitting: Family Medicine

## 2016-11-29 NOTE — Telephone Encounter (Signed)
Left message for patient with phone number for [facility], the pharmacy that we use for Pennsaid. [phone].

## 2016-11-29 NOTE — Telephone Encounter (Signed)
Patient called back.  Gave him Valerie's message.

## 2016-11-29 NOTE — Telephone Encounter (Signed)
Spouse called in wanting to know when patient will receive pennsaid in the mail.  States patient is almost out of samples.

## 2016-12-29 ENCOUNTER — Ambulatory Visit (INDEPENDENT_AMBULATORY_CARE_PROVIDER_SITE_OTHER): Payer: 59 | Admitting: Family Medicine

## 2016-12-29 ENCOUNTER — Ambulatory Visit (INDEPENDENT_AMBULATORY_CARE_PROVIDER_SITE_OTHER): Payer: 59 | Admitting: Family

## 2016-12-29 ENCOUNTER — Encounter: Payer: Self-pay | Admitting: Family

## 2016-12-29 ENCOUNTER — Encounter: Payer: Self-pay | Admitting: Family Medicine

## 2016-12-29 VITALS — BP 128/78 | HR 87 | Ht 65.0 in | Wt 224.0 lb

## 2016-12-29 VITALS — BP 128/78 | HR 87 | Temp 98.6°F | Resp 14 | Ht 65.0 in | Wt 224.8 lb

## 2016-12-29 DIAGNOSIS — M19072 Primary osteoarthritis, left ankle and foot: Secondary | ICD-10-CM | POA: Diagnosis not present

## 2016-12-29 DIAGNOSIS — I10 Essential (primary) hypertension: Secondary | ICD-10-CM | POA: Diagnosis not present

## 2016-12-29 MED ORDER — FUROSEMIDE 20 MG PO TABS: 10.0000 mg | ORAL_TABLET | Freq: Every day | ORAL | 1 refills | Status: DC | PRN

## 2016-12-29 MED ORDER — AMLODIPINE BESYLATE 5 MG PO TABS: 5.0000 mg | ORAL_TABLET | Freq: Every day | ORAL | 1 refills | Status: DC

## 2016-12-29 NOTE — Patient Instructions (Addendum)
I am sorry man.  I was hoping the injection would help.  Dr. Clyde Canterbury will be a good option to discuss what else we could do.  I think some of the loose bodies in the ankle are likely causing the pain  If you like the brace wear it when doing a lot of activity  Try the topical medicine. Continue the vitamin D For the swelling only use the lasix when needed.  See me again if you have questions.

## 2016-12-29 NOTE — Patient Instructions (Addendum)
Thank you for choosing [facility].  SUMMARY AND INSTRUCTIONS:  Start taking amlodipine.  Continue to check your blood pressure at home once a day at different times of the day.  Bring your blood pressure cuff to your next office visit.   Start the [facility] eating plan.   Schedule a time for your physical at your convenience.   Schedule a nurse visit for 2 weeks to recheck blood pressure.   Medication:  Your prescription(s) have been submitted to your pharmacy or been printed and provided for you. Please take as directed and contact our office if you believe you are having problem(s) with the medication(s) or have any questions.  Labs:  Please stop by the lab on the lower level of the building for your blood work. Your results will be released to [place] (or called to you) after review, usually within 72 hours after test completion. If any changes need to be made, you will be notified at that same time.  1.) The lab is open from 7:30am to 5:30 pm Monday-Friday 2.) No appointment is necessary 3.) Fasting (if needed) is 6-8 hours after food and drink; black coffee and water are okay   Imaging / Radiology:  Please stop by radiology on the basement level of the building for your x-rays. Your results will be released to [place] (or called to you) after review, usually within 72 hours after test completion. If any treatments or changes are necessary, you will be notified at that same time.  Referrals:  Referrals have been made during this visit. You should expect to hear back from our schedulers in about 7-10 days in regards to establishing an appointment with the specialists we discussed.   Follow up:  If your symptoms worsen or fail to improve, please contact our office for further instruction, or in case of emergency go directly to the emergency room at the closest medical facility.     Hypertension Hypertension is another name for high blood pressure. High blood pressure  forces your heart to work harder to pump blood. This can cause problems over time. There are two numbers in a blood pressure reading. There is a top number (systolic) over a bottom number (diastolic). It is best to have a blood pressure below 120/80. Healthy choices can help lower your blood pressure. You may need medicine to help lower your blood pressure if:  Your blood pressure cannot be lowered with healthy choices.  Your blood pressure is higher than 130/80.  Follow these instructions at home: Eating and drinking  If directed, follow the DASH eating plan. This diet includes: ? Filling half of your plate at each meal with fruits and vegetables. ? Filling one quarter of your plate at each meal with whole grains. Whole grains include whole wheat pasta, brown rice, and whole grain bread. ? Eating or drinking low-fat dairy products, such as skim milk or low-fat yogurt. ? Filling one quarter of your plate at each meal with low-fat (lean) proteins. Low-fat proteins include fish, skinless chicken, eggs, beans, and tofu. ? Avoiding fatty meat, cured and processed meat, or chicken with skin. ? Avoiding premade or processed food.  Eat less than 1,500 mg of salt (sodium) a day.  Limit alcohol use to no more than 1 drink a day for nonpregnant women and 2 drinks a day for men. One drink equals 12 oz of beer, 5 oz of wine, or 1 oz of hard liquor. Lifestyle  Work with your doctor to stay at  a healthy weight or to lose weight. Ask your doctor what the best weight is for you.  Get at least 30 minutes of exercise that causes your heart to beat faster (aerobic exercise) most days of the week. This may include walking, swimming, or biking.  Get at least 30 minutes of exercise that strengthens your muscles (resistance exercise) at least 3 days a week. This may include lifting weights or pilates.  Do not use any products that contain nicotine or tobacco. This includes cigarettes and e-cigarettes. If you  need help quitting, ask your doctor.  Check your blood pressure at home as told by your doctor.  Keep all follow-up visits as told by your doctor. This is important. Medicines  Take over-the-counter and prescription medicines only as told by your doctor. Follow directions carefully.  Do not skip doses of blood pressure medicine. The medicine does not work as well if you skip doses. Skipping doses also puts you at risk for problems.  Ask your doctor about side effects or reactions to medicines that you should watch for. Contact a doctor if:  You think you are having a reaction to the medicine you are taking.  You have headaches that keep coming back (recurring).  You feel dizzy.  You have swelling in your ankles.  You have trouble with your vision. Get help right away if:  You get a very bad headache.  You start to feel confused.  You feel weak or numb.  You feel faint.  You get very bad pain in your: ? Chest. ? Belly (abdomen).  You throw up (vomit) more than once.  You have trouble breathing. Summary  Hypertension is another name for high blood pressure.  Making healthy choices can help lower blood pressure. If your blood pressure cannot be controlled with healthy choices, you may need to take medicine. This information is not intended to replace advice given to you by your health care provider. Make sure you discuss any questions you have with your health care provider. Document Released: 01/04/2008 Document Revised: 06/15/2016 Document Reviewed: 06/15/2016 Elsevier Interactive Patient Education  2018 [place] Eating Plan DASH stands for "Dietary Approaches to Stop Hypertension." The DASH eating plan is a healthy eating plan that has been shown to reduce high blood pressure (hypertension). It may also reduce your risk for type 2 diabetes, heart disease, and stroke. The DASH eating plan may also help with weight loss. What are tips for following this  plan? General guidelines  Avoid eating more than 2,300 mg (milligrams) of salt (sodium) a day. If you have hypertension, you may need to reduce your sodium intake to 1,500 mg a day.  Limit alcohol intake to no more than 1 drink a day for nonpregnant women and 2 drinks a day for men. One drink equals 12 oz of beer, 5 oz of wine, or 1 oz of hard liquor.  Work with your health care provider to maintain a healthy body weight or to lose weight. Ask what an ideal weight is for you.  Get at least 30 minutes of exercise that causes your heart to beat faster (aerobic exercise) most days of the week. Activities may include walking, swimming, or biking.  Work with your health care provider or diet and nutrition specialist (dietitian) to adjust your eating plan to your individual calorie needs. Reading food labels  Check food labels for the amount of sodium per serving. Choose foods with less than 5 percent of the Daily  Value of sodium. Generally, foods with less than 300 mg of sodium per serving fit into this eating plan.  To find whole grains, look for the word "whole" as the first word in the ingredient list. Shopping  Buy products labeled as "low-sodium" or "no salt added."  Buy fresh foods. Avoid canned foods and premade or frozen meals. Cooking  Avoid adding salt when cooking. Use salt-free seasonings or herbs instead of table salt or sea salt. Check with your health care provider or pharmacist before using salt substitutes.  Do not fry foods. Cook foods using healthy methods such as baking, boiling, grilling, and broiling instead.  Cook with heart-healthy oils, such as olive, canola, soybean, or sunflower oil. Meal planning   Eat a balanced diet that includes: ? 5 or more servings of fruits and vegetables each day. At each meal, try to fill half of your plate with fruits and vegetables. ? Up to 6-8 servings of whole grains each day. ? Less than 6 oz of lean meat, poultry, or fish each  day. A 3-oz serving of meat is about the same size as a deck of cards. One egg equals 1 oz. ? 2 servings of low-fat dairy each day. ? A serving of nuts, seeds, or beans 5 times each week. ? Heart-healthy fats. Healthy fats called Omega-3 fatty acids are found in foods such as flaxseeds and coldwater fish, like sardines, salmon, and mackerel.  Limit how much you eat of the following: ? Canned or prepackaged foods. ? Food that is high in trans fat, such as fried foods. ? Food that is high in saturated fat, such as fatty meat. ? Sweets, desserts, sugary drinks, and other foods with added sugar. ? Full-fat dairy products.  Do not salt foods before eating.  Try to eat at least 2 vegetarian meals each week.  Eat more home-cooked food and less restaurant, buffet, and fast food.  When eating at a restaurant, ask that your food be prepared with less salt or no salt, if possible. What foods are recommended? The items listed may not be a complete list. Talk with your dietitian about what dietary choices are best for you. Grains Whole-grain or whole-wheat bread. Whole-grain or whole-wheat pasta. Brown rice. [place]. Bulgur. Whole-grain and low-sodium cereals. Pita bread. Low-fat, low-sodium crackers. Whole-wheat flour tortillas. Vegetables Fresh or frozen vegetables (raw, steamed, roasted, or grilled). Low-sodium or reduced-sodium tomato and vegetable juice. Low-sodium or reduced-sodium tomato sauce and tomato paste. Low-sodium or reduced-sodium canned vegetables. Fruits All fresh, dried, or frozen fruit. Canned fruit in natural juice (without added sugar). Meat and other protein foods Skinless chicken or [place]. Ground chicken or [place]. Pork with fat trimmed off. Fish and seafood. Egg whites. Dried beans, peas, or lentils. Unsalted nuts, nut butters, and seeds. Unsalted canned beans. Lean cuts of beef with fat trimmed off. Low-sodium, lean deli meat. Dairy Low-fat (1%) or fat-free (skim)  milk. Fat-free, low-fat, or reduced-fat cheeses. Nonfat, low-sodium ricotta or cottage cheese. Low-fat or nonfat yogurt. Low-fat, low-sodium cheese. Fats and oils Soft margarine without trans fats. Vegetable oil. Low-fat, reduced-fat, or light mayonnaise and salad dressings (reduced-sodium). Canola, safflower, olive, soybean, and sunflower oils. Avocado. Seasoning and other foods Herbs. Spices. Seasoning mixes without salt. Unsalted popcorn and pretzels. Fat-free sweets. What foods are not recommended? The items listed may not be a complete list. Talk with your dietitian about what dietary choices are best for you. Grains Baked goods made with fat, such as croissants, muffins, or some  breads. Dry pasta or rice meal packs. Vegetables Creamed or fried vegetables. Vegetables in a cheese sauce. Regular canned vegetables (not low-sodium or reduced-sodium). Regular canned tomato sauce and paste (not low-sodium or reduced-sodium). Regular tomato and vegetable juice (not low-sodium or reduced-sodium). Angie Fava. Olives. Fruits Canned fruit in a light or heavy syrup. Fried fruit. Fruit in cream or butter sauce. Meat and other protein foods Fatty cuts of meat. Ribs. Fried meat. Berniece Salines. Sausage. Bologna and other processed lunch meats. Salami. Fatback. Hotdogs. Bratwurst. Salted nuts and seeds. Canned beans with added salt. Canned or smoked fish. Whole eggs or egg yolks. Chicken or [place] with skin. Dairy Whole or 2% milk, cream, and half-and-half. Whole or full-fat cream cheese. Whole-fat or sweetened yogurt. Full-fat cheese. Nondairy creamers. Whipped toppings. Processed cheese and cheese spreads. Fats and oils Butter. Stick margarine. Lard. Shortening. Ghee. Bacon fat. Tropical oils, such as coconut, palm kernel, or palm oil. Seasoning and other foods Salted popcorn and pretzels. Onion salt, garlic salt, seasoned salt, table salt, and sea salt. Worcestershire sauce. Tartar sauce. Barbecue sauce. Teriyaki  sauce. Soy sauce, including reduced-sodium. Steak sauce. Canned and packaged gravies. Fish sauce. Oyster sauce. Cocktail sauce. Horseradish that you find on the shelf. Ketchup. Mustard. Meat flavorings and tenderizers. Bouillon cubes. Hot sauce and Tabasco sauce. Premade or packaged marinades. Premade or packaged taco seasonings. Relishes. Regular salad dressings. Where to find more information:  National Heart, Lung, and [place]: https://wilson-eaton.com/  American Heart Association: www.heart.org Summary  The DASH eating plan is a healthy eating plan that has been shown to reduce high blood pressure (hypertension). It may also reduce your risk for type 2 diabetes, heart disease, and stroke.  With the DASH eating plan, you should limit salt (sodium) intake to 2,300 mg a day. If you have hypertension, you may need to reduce your sodium intake to 1,500 mg a day.  When on the DASH eating plan, aim to eat more fresh fruits and vegetables, whole grains, lean proteins, low-fat dairy, and heart-healthy fats.  Work with your health care provider or diet and nutrition specialist (dietitian) to adjust your eating plan to your individual calorie needs. This information is not intended to replace advice given to you by your health care provider. Make sure you discuss any questions you have with your health care provider. Document Released: 07/07/2011 Document Revised: 07/11/2016 Document Reviewed: 07/11/2016 Elsevier Interactive Patient Education  2017 [employer].     Why follow it? Research shows. . Those who follow the Mediterranean diet have a reduced risk of heart disease  . The diet is associated with a reduced incidence of Parkinson's and Alzheimer's diseases . People following the diet may have longer life expectancies and lower rates of chronic diseases  . The Dietary Guidelines for Americans recommends the Mediterranean diet as an eating plan to promote health and prevent disease  What Is  the Mediterranean Diet?  . Healthy eating plan based on typical foods and recipes of Mediterranean-style cooking . The diet is primarily a plant based diet; these foods should make up a majority of meals   Starches - Plant based foods should make up a majority of meals - They are an important sources of vitamins, minerals, energy, antioxidants, and fiber - Choose whole grains, foods high in fiber and minimally processed items  - Typical grain sources include wheat, oats, barley, corn, brown rice, bulgar, farro, millet, polenta, couscous  - Various types of beans include chickpeas, lentils, fava beans, black beans, white beans  Fruits  Veggies - Large quantities of antioxidant rich fruits & veggies; 6 or more servings  - Vegetables can be eaten raw or lightly drizzled with oil and cooked  - Vegetables common to the traditional Mediterranean Diet include: artichokes, arugula, beets, broccoli, brussel sprouts, cabbage, carrots, celery, collard greens, cucumbers, eggplant, kale, leeks, lemons, lettuce, mushrooms, okra, onions, peas, peppers, potatoes, pumpkin, radishes, rutabaga, shallots, spinach, sweet potatoes, turnips, zucchini - Fruits common to the Mediterranean Diet include: apples, apricots, avocados, cherries, clementines, dates, figs, grapefruits, grapes, melons, nectarines, oranges, peaches, pears, pomegranates, strawberries, tangerines  Fats - Replace butter and margarine with healthy oils, such as olive oil, canola oil, and tahini  - Limit nuts to no more than a handful a day  - Nuts include walnuts, almonds, pecans, pistachios, pine nuts  - Limit or avoid candied, honey roasted or heavily salted nuts - Olives are central to the [employer] - can be eaten whole or used in a variety of dishes   Meats Protein - Limiting red meat: no more than a few times a month - When eating red meat: choose lean cuts and keep the portion to the size of deck of cards - Eggs: approx. 0 to 4 times a  week  - Fish and lean poultry: at least 2 a week  - Healthy protein sources include, chicken, [place], lean beef, lamb - Increase intake of seafood such as tuna, salmon, trout, mackerel, shrimp, scallops - Avoid or limit high fat processed meats such as sausage and bacon  Dairy - Include moderate amounts of low fat dairy products  - Focus on healthy dairy such as fat free yogurt, skim milk, low or reduced fat cheese - Limit dairy products higher in fat such as whole or 2% milk, cheese, ice cream  Alcohol - Moderate amounts of red wine is ok  - No more than 5 oz daily for women (all ages) and men older than age 34  - No more than 10 oz of wine daily for men younger than 78  Other - Limit sweets and other desserts  - Use herbs and spices instead of salt to flavor foods  - Herbs and spices common to the traditional Mediterranean Diet include: basil, bay leaves, chives, cloves, cumin, fennel, garlic, lavender, marjoram, mint, oregano, parsley, pepper, rosemary, sage, savory, sumac, tarragon, thyme   It's not just a diet, it's a lifestyle:  . The Mediterranean diet includes lifestyle factors typical of those in the region  . Foods, drinks and meals are best eaten with others and savored . Daily physical activity is important for overall good health . This could be strenuous exercise like running and aerobics . This could also be more leisurely activities such as walking, housework, yard-work, or taking the stairs . Moderation is the key; a balanced and healthy diet accommodates most foods and drinks . Consider portion sizes and frequency of consumption of certain foods   Meal Ideas & Options:  . Breakfast:  o Whole wheat toast or whole wheat English muffins with peanut butter & hard boiled egg o Steel cut oats topped with apples & cinnamon and skim milk  o Fresh fruit: banana, strawberries, melon, berries, peaches  o Smoothies: strawberries, bananas, greek yogurt, peanut butter o Low fat  greek yogurt with blueberries and granola  o Egg white omelet with spinach and mushrooms o Breakfast couscous: whole wheat couscous, apricots, skim milk, cranberries  . Sandwiches:  o Hummus and grilled vegetables (peppers, zucchini, squash) on  whole wheat bread   o Grilled chicken on whole wheat pita with lettuce, tomatoes, cucumbers or tzatziki  o Tuna salad on whole wheat bread: tuna salad made with greek yogurt, olives, red peppers, capers, green onions o Garlic rosemary lamb pita: lamb sauted with garlic, rosemary, salt & pepper; add lettuce, cucumber, greek yogurt to pita - flavor with lemon juice and black pepper  . Seafood:  o Mediterranean grilled salmon, seasoned with garlic, basil, parsley, lemon juice and black pepper o Shrimp, lemon, and spinach whole-grain pasta salad made with low fat greek yogurt  o Seared scallops with lemon orzo  o Seared tuna steaks seasoned salt, pepper, coriander topped with tomato mixture of olives, tomatoes, olive oil, minced garlic, parsley, green onions and cappers  . Meats:  o Herbed greek chicken salad with kalamata olives, cucumber, feta  o Red bell peppers stuffed with spinach, bulgur, lean ground beef (or lentils) & topped with feta   o Kebabs: skewers of chicken, tomatoes, onions, zucchini, squash  o [place] burgers: made with red onions, mint, dill, lemon juice, feta cheese topped with roasted red peppers . Vegetarian o Cucumber salad: cucumbers, artichoke hearts, celery, red onion, feta cheese, tossed in olive oil & lemon juice  o Hummus and whole grain pita points with a greek salad (lettuce, tomato, feta, olives, cucumbers, red onion) o Lentil soup with celery, carrots made with vegetable broth, garlic, salt and pepper  o Tabouli salad: parsley, bulgur, mint, scallions, cucumbers, tomato, radishes, lemon juice, olive oil, salt and pepper.

## 2016-12-29 NOTE — Progress Notes (Signed)
[name] [address], [place] [address] Phone: [phone] Subjective:   CC: Left ankle pain f/u   [id number]  Brandon Reyes is a 56 y.o. male coming in with complaint of left ankle pain. Patient did have a fracture the tibia and proximal fibula back in November 2016 that needed surgery.. Patient though unfortunately had a recurrent injury in December 2017.    patient did have x-rays taken 07/30/2016. Patient did have an ORIF half noted with no significant loosening. There is arthritic changes of significant posttraumatic of the ankle joint possible intra-articular loose bodies noted as well.   Patient was seen by me and did have significant posttraumatic osteoarthritic changes of the ankle. Patient also had significant number of loose bodies and joint effusion. Patient understood the risks and didn't have an injection of the right ankle. Patient was to do conservative therapy including bracing, home exercises, icing regimen. Patient states Unfortunately has not made any significant improvement. Patient is having worsening symptoms. Patient is having difficulty even with walking. Patient states that there might be more swelling as well.  Past Medical History:  Diagnosis Date  . Arthritis    Past Surgical History:  Procedure Laterality Date  . ANKLE FRACTURE SURGERY    . INSERTION OF MESH N/A 09/26/2016   Procedure: INSERTION OF MESH;  Surgeon: Greer Pickerel, MD;  Location: [facility];  Service: General;  Laterality: N/A;  . UMBILICAL HERNIA REPAIR N/A 09/26/2016   Procedure: LAPAROSCOPIC ASSISTED UMBILICAL HERNIA REPAIR WITH MESH;  Surgeon: Greer Pickerel, MD;  Location: [facility];  Service: General;  Laterality: N/A;   Social History   Social History  . Marital status: Married    Spouse name: N/A  . Number of children: N/A  . Years of education: N/A   Social History Main Topics  . Smoking status: Never Smoker  . Smokeless tobacco: Never Used  .  Alcohol use No  . Drug use: No  . Sexual activity: Not on file   Other Topics Concern  . Not on file   Social History Narrative  . No narrative on file   No Known Allergies No family history on file.  Past medical history, social, surgical and family history all reviewed in electronic medical record.  No pertanent information unless stated regarding to the chief complaint.   Review of Systems: No headache, visual changes, nausea, vomiting, diarrhea, constipation, dizziness, abdominal pain, skin rash, fevers, chills, night sweats, weight loss, swollen lymph nodes, , chest pain, shortness of breath, mood changes.  Positive joint swelling and muscle aches   Objective  There were no vitals taken for this visit. Systems examined below as of 12/29/16   Systems examined below as of 12/29/16 General: NAD A&O x3 mood, affect normal  HEENT: Pupils equal, extraocular movements intact no nystagmus Respiratory: not short of breath at rest or with speaking Cardiovascular: No lower extremity edema, non tender Skin: Warm dry intact with no signs of infection or rash on extremities or on axial skeleton. Abdomen: Soft nontender, no masses Neuro: Cranial nerves  intact, neurovascularly intact in all extremities with 2+ DTRs and 2+ pulses. Lymph: No lymphadenopathy appreciated today  Gait Antalgic gait but not using a cane MSK:  Non tender with full range of motion and good stability and symmetric strength and tone of shoulders, elbows, wrist, hip, knees bilaterally.  Severe pes planus with overpronation.  Ankle: Left Trace effusion noted. Continued decreased range of motion. In all planes.  Strength is 5/5 in all directions. Stable lateral and medial ligaments; squeeze test and kleiger test unremarkable; Talar dome moderately tender No pain at base of 5th MT; No tenderness over cuboid; No tenderness over N spot or navicular prominence No tenderness on posterior aspects of lateral and medial  malleolus No sign of peroneal tendon subluxations or tenderness to palpation Negative tarsal tunnel tinel's Able to walk 4 steps. Antalgic Contralateral ankle fairly unremarkable.     Impression and Recommendations:     This case required medical decision making of moderate complexity.      Note: This dictation was prepared with Dragon dictation along with smaller phrase technology. Any transcriptional errors that result from this process are unintentional.       ;l

## 2016-12-29 NOTE — Assessment & Plan Note (Signed)
Multiple blood pressure readings above 140/90 with blood pressure being labile. Start amlodipine. Encouraged a low sodium diet with DASH diet information provided in AVS. Increase physical activity as tolerated. Denies worst headache of life with no symptoms of end organ damage noted. Monitor blood pressure at home and bring cuff for correlation to next office visit in 2 weeks.

## 2016-12-29 NOTE — Assessment & Plan Note (Addendum)
Patient has severe osteophytic changes of the ankle with loose bodies. Discussed with patient in great length. We discussed continuing the topical anti-inflammatories. I do think that conservative will be helpful at this point. Patient will be referred to orthopedic surgery discuss other potential interventions. Patient was given a different brace for more stability.  Spent  25 minutes with patient face-to-face and had greater than 50% of counseling including as described above including different treatment options, continuing certain options, as well as bracing. in assessment and plan.

## 2016-12-29 NOTE — Progress Notes (Signed)
Subjective:    Patient ID: Brandon Reyes, male    DOB: [birth year], 56 y.o.   MRN: [id number]  Chief Complaint  Patient presents with  . Establish Care    blood pressure    HPI:  Brandon Reyes is a 56 y.o. male who  has a past medical history of Arthritis. and presents today for an office visit to establish care.    1.) Blood pressure - This is a new problem. Noted to have elevated blood pressures at times and has been under a significant amount of stress recently secondary to work and life stressors. He is currently working on obtaining disability. Blood pressures have been greater than 140/90. Denies headaches, changes in vision or new symptoms of end organ damage. Not currently following a low sodium diet.   BP Readings from Last 3 Encounters:  12/29/16 128/78  12/29/16 128/78  11/17/16 (!) 130/100    No Known Allergies    Outpatient Medications Prior to Visit  Medication Sig Dispense Refill  . Diclofenac Sodium (PENNSAID) 2 % SOLN Place 2 application onto the skin 2 (two) times daily. 112 g 3  . ibuprofen (ADVIL,MOTRIN) 200 MG tablet Take 400 mg by mouth every 6 (six) hours as needed for mild pain or moderate pain.    . Vitamin D, Ergocalciferol, (DRISDOL) 50000 units CAPS capsule Take 1 capsule (50,000 Units total) by mouth every 7 (seven) days. 12 capsule 0   No facility-administered medications prior to visit.      Past Medical History:  Diagnosis Date  . Arthritis       Past Surgical History:  Procedure Laterality Date  . ANKLE FRACTURE SURGERY    . INSERTION OF MESH N/A 09/26/2016   Procedure: INSERTION OF MESH;  Surgeon: Greer Pickerel, MD;  Location: [facility];  Service: General;  Laterality: N/A;  . UMBILICAL HERNIA REPAIR N/A 09/26/2016   Procedure: LAPAROSCOPIC ASSISTED UMBILICAL HERNIA REPAIR WITH MESH;  Surgeon: Greer Pickerel, MD;  Location: [facility];  Service: General;  Laterality: N/A;      Family History  Problem Relation Age of Onset  . Hypertension  Mother   . Diabetes Mother   . Hypertension Sister   . Hypertension Maternal Grandmother   . Hypertension Sister       Social History   Social History  . Marital status: Married    Spouse name: N/A  . Number of children: 0  . Years of education: 12   Occupational History  . Unemployed    Social History Main Topics  . Smoking status: Never Smoker  . Smokeless tobacco: Never Used  . Alcohol use No  . Drug use: No  . Sexual activity: Not on file   Other Topics Concern  . Not on file   Social History Narrative   Fun/Hobby - Travel       Review of Systems  Constitutional: Negative for chills and fever.  Eyes:       Negative for changes in vision  Respiratory: Negative for cough, chest tightness and wheezing.   Cardiovascular: Negative for chest pain, palpitations and leg swelling.  Neurological: Negative for dizziness, weakness and light-headedness.       Objective:    BP 128/78 (BP Location: Left Arm, Patient Position: Sitting, Cuff Size: Large)   Pulse 87   Temp 98.6 F (37 C) (Oral)   Resp 14   Ht 5\' 5"  (1.651 m)   Wt 224 lb 12.8 oz (102 kg)   SpO2 98%  BMI 37.41 kg/m  Nursing note and vital signs reviewed.  Physical Exam  Constitutional: He is oriented to person, place, and time. He appears well-developed and well-nourished. No distress.  Surgical shoe located on left foot.   Cardiovascular: Normal rate, regular rhythm, normal heart sounds and intact distal pulses.  Exam reveals no gallop and no friction rub.   No murmur heard. Pulmonary/Chest: Effort normal and breath sounds normal. No respiratory distress. He has no wheezes. He has no rales. He exhibits no tenderness.  Neurological: He is alert and oriented to person, place, and time.  Skin: Skin is warm and dry.  Psychiatric: He has a normal mood and affect. His behavior is normal. Judgment and thought content normal.        Assessment & Plan:   Problem List Items Addressed This Visit        Cardiovascular and Mediastinum   Essential hypertension - Primary    Multiple blood pressure readings above 140/90 with blood pressure being labile. Start amlodipine. Encouraged a low sodium diet with DASH diet information provided in AVS. Increase physical activity as tolerated. Denies worst headache of life with no symptoms of end organ damage noted. Monitor blood pressure at home and bring cuff for correlation to next office visit in 2 weeks.       Relevant Medications   amLODipine (NORVASC) 5 MG tablet       I have discontinued Mr. Chaney ibuprofen, Diclofenac Sodium, and Vitamin D (Ergocalciferol). I am also having him start on amLODipine.   Meds ordered this encounter  Medications  . amLODipine (NORVASC) 5 MG tablet    Sig: Take 1 tablet (5 mg total) by mouth daily.    Dispense:  30 tablet    Refill:  1    Order Specific Question:   Supervising Provider    Answer:   Pricilla Holm A [[id number]]     Follow-up: Return in about 2 weeks (around 01/12/2017), or if symptoms worsen or fail to improve.  Mauricio Po, FNP

## 2017-01-03 ENCOUNTER — Telehealth: Payer: Self-pay

## 2017-01-03 DIAGNOSIS — M19072 Primary osteoarthritis, left ankle and foot: Secondary | ICD-10-CM | POA: Diagnosis not present

## 2017-01-03 NOTE — Telephone Encounter (Signed)
-----   Message from Jacqualin Combes sent at 01/03/2017  2:13 PM EDT ----- Patient's wife Ivin Booty stopped in the office to exchange the velocity ankle brace for a short cam walker. She said that Walter was not compliant with wearing the brace due to it being uncomfortable and not able to fit it in his shoes. He wanted to try the short cam walker. Gilford Rile was provided to wife who filled out a [employer] form.   Ivin Booty later called stating that [employer] would not exchange her brace. I spoke with Lovett Calender as he was in the office at the time and he said that she could try calling billing back and state that she would like to return the brace because she in reality was return the brace not exchanging it for the cam walker. Patient agreed to call back. I told her to let me know if she had any issues and we could get Thurmond Butts involved to help with the return. Patient voiced understanding.

## 2017-01-23 ENCOUNTER — Ambulatory Visit: Payer: 59

## 2017-01-23 ENCOUNTER — Telehealth: Payer: Self-pay | Admitting: Family

## 2017-01-23 ENCOUNTER — Telehealth: Payer: Self-pay

## 2017-01-23 VITALS — BP 130/72

## 2017-01-23 DIAGNOSIS — I10 Essential (primary) hypertension: Secondary | ICD-10-CM

## 2017-01-23 NOTE — Telephone Encounter (Signed)
Please continue to take blood pressure medications as prescribed. Numbers are looking good. Follow up in 3 months.

## 2017-01-23 NOTE — Telephone Encounter (Signed)
Patient came in today for bp recheck (nurse visit)---reading today was 130/72---I did not make any changes to med and patient still has 1 refill remaining on amlodipine and lasix----do you want to make any changes----please advise, I will call patient back, thanks

## 2017-01-23 NOTE — Telephone Encounter (Signed)
Pt scheduled and aware

## 2017-01-23 NOTE — Telephone Encounter (Signed)
Wife called regarding the pts referral to orthopedic surgery to [facility], the wife called the office and states that they only except referrals.over the phone   She would a call back and for you to call the office to make an appointment for him to get the plate removed from his ankle.

## 2017-01-24 NOTE — Telephone Encounter (Signed)
Left message advising patient of Brandon Reyes note/instructions, call back if any questions

## 2017-03-07 ENCOUNTER — Telehealth: Payer: Self-pay | Admitting: Family Medicine

## 2017-03-07 ENCOUNTER — Telehealth: Payer: Self-pay | Admitting: Family

## 2017-03-07 MED ORDER — AMLODIPINE BESYLATE 5 MG PO TABS: 5.0000 mg | ORAL_TABLET | Freq: Every day | ORAL | 0 refills | Status: DC

## 2017-03-07 MED ORDER — FUROSEMIDE 20 MG PO TABS: 10.0000 mg | ORAL_TABLET | Freq: Every day | ORAL | 0 refills | Status: DC | PRN

## 2017-03-07 NOTE — Telephone Encounter (Signed)
Pt wife called stating he needs a refill of  furosemide (LASIX) 20 MG tablet   [facility] on [address] in [place]

## 2017-03-07 NOTE — Telephone Encounter (Signed)
Pt wife called stating he needs a refill of  amLODipine (NORVASC) 5 MG tablet  [facility] on [address] in [place]

## 2017-03-07 NOTE — Telephone Encounter (Signed)
Rx refilled.

## 2017-03-07 NOTE — Telephone Encounter (Signed)
Refill done.  

## 2017-03-09 MED ORDER — FUROSEMIDE 20 MG PO TABS: 10.0000 mg | ORAL_TABLET | Freq: Every day | ORAL | 0 refills | Status: DC | PRN

## 2017-03-09 NOTE — Telephone Encounter (Signed)
Wife call, Rx sent to wrong pharmacy please sent to   [facility] on [address] in [place]

## 2017-03-09 NOTE — Telephone Encounter (Signed)
Refill done.  

## 2017-03-09 NOTE — Addendum Note (Signed)
Addended by: Douglass Rivers T on: 03/09/2017 01:44 PM   Modules accepted: Orders

## 2017-03-20 DIAGNOSIS — Z79899 Other long term (current) drug therapy: Secondary | ICD-10-CM | POA: Diagnosis not present

## 2017-03-20 DIAGNOSIS — M19172 Post-traumatic osteoarthritis, left ankle and foot: Secondary | ICD-10-CM | POA: Diagnosis not present

## 2017-03-20 DIAGNOSIS — I10 Essential (primary) hypertension: Secondary | ICD-10-CM | POA: Diagnosis not present

## 2017-03-20 DIAGNOSIS — M19072 Primary osteoarthritis, left ankle and foot: Secondary | ICD-10-CM | POA: Diagnosis not present

## 2017-03-20 DIAGNOSIS — Z9889 Other specified postprocedural states: Secondary | ICD-10-CM | POA: Diagnosis not present

## 2017-03-20 DIAGNOSIS — M19079 Primary osteoarthritis, unspecified ankle and foot: Secondary | ICD-10-CM | POA: Diagnosis not present

## 2017-03-24 ENCOUNTER — Telehealth: Payer: Self-pay | Admitting: Family

## 2017-03-24 NOTE — Telephone Encounter (Signed)
Wife called in and said that pt needs a written script for support hose?  She would like to pick one up Monday so she can take it and get him some

## 2017-03-27 NOTE — Telephone Encounter (Signed)
Prescription left at front desk to come by & pick up. Pt made aware.

## 2017-04-20 ENCOUNTER — Other Ambulatory Visit: Payer: Self-pay | Admitting: Family Medicine

## 2017-04-20 ENCOUNTER — Other Ambulatory Visit: Payer: Self-pay

## 2017-04-20 NOTE — Telephone Encounter (Signed)
Refill done.  

## 2017-05-18 DIAGNOSIS — T85848A Pain due to other internal prosthetic devices, implants and grafts, initial encounter: Secondary | ICD-10-CM | POA: Diagnosis not present

## 2017-05-18 DIAGNOSIS — M19079 Primary osteoarthritis, unspecified ankle and foot: Secondary | ICD-10-CM | POA: Diagnosis not present

## 2017-05-18 DIAGNOSIS — M19172 Post-traumatic osteoarthritis, left ankle and foot: Secondary | ICD-10-CM | POA: Diagnosis not present

## 2017-07-01 ENCOUNTER — Encounter (HOSPITAL_COMMUNITY): Payer: Self-pay | Admitting: Nurse Practitioner

## 2017-07-01 ENCOUNTER — Emergency Department (HOSPITAL_COMMUNITY)
Admission: EM | Admit: 2017-07-01 | Discharge: 2017-07-01 | Disposition: A | Discharge status: Home / Self Care | Payer: 59 | Attending: Emergency Medicine | Admitting: Emergency Medicine

## 2017-07-01 ENCOUNTER — Other Ambulatory Visit: Payer: Self-pay

## 2017-07-01 DIAGNOSIS — Z79899 Other long term (current) drug therapy: Secondary | ICD-10-CM | POA: Diagnosis not present

## 2017-07-01 DIAGNOSIS — I10 Essential (primary) hypertension: Secondary | ICD-10-CM | POA: Diagnosis not present

## 2017-07-01 DIAGNOSIS — T24101A Burn of first degree of unspecified site of right lower limb, except ankle and foot, initial encounter: Secondary | ICD-10-CM | POA: Diagnosis not present

## 2017-07-01 DIAGNOSIS — T25121A Burn of first degree of right foot, initial encounter: Secondary | ICD-10-CM | POA: Insufficient documentation

## 2017-07-01 DIAGNOSIS — Y999 Unspecified external cause status: Secondary | ICD-10-CM | POA: Insufficient documentation

## 2017-07-01 DIAGNOSIS — S90821A Blister (nonthermal), right foot, initial encounter: Secondary | ICD-10-CM | POA: Diagnosis present

## 2017-07-01 DIAGNOSIS — Y929 Unspecified place or not applicable: Secondary | ICD-10-CM | POA: Diagnosis not present

## 2017-07-01 DIAGNOSIS — Z23 Encounter for immunization: Secondary | ICD-10-CM | POA: Diagnosis not present

## 2017-07-01 DIAGNOSIS — Y9389 Activity, other specified: Secondary | ICD-10-CM | POA: Diagnosis not present

## 2017-07-01 DIAGNOSIS — X19XXXA Contact with other heat and hot substances, initial encounter: Secondary | ICD-10-CM | POA: Diagnosis not present

## 2017-07-01 MED ORDER — SILVER SULFADIAZINE 1 % EX CREA
TOPICAL_CREAM | Freq: Once | CUTANEOUS | Status: AC
  Administered 2017-07-01: 1 via TOPICAL
  Filled 2017-07-01: qty 50

## 2017-07-01 MED ORDER — TETANUS-DIPHTH-ACELL PERTUSSIS 5-2.5-18.5 LF-MCG/0.5 IM SUSP
0.5000 mL | Freq: Once | INTRAMUSCULAR | Status: AC
  Administered 2017-07-01: 0.5 mL via INTRAMUSCULAR
  Filled 2017-07-01: qty 0.5

## 2017-07-01 NOTE — ED Triage Notes (Signed)
Patient reports using a warm pack to feet area to stay warm, unfortunately it was left there for to long and he reports he has developed a possible burn to bottom of right foot. Area is red, tender to touch, and hurts upon ambulation. Patient is ambulatory with a limp. Denies any home treatments to the area.

## 2017-07-01 NOTE — Discharge Instructions (Signed)
Change the burn dressing every 12 hours. Follow up for recheck in 48 hours to be sure the area is improving. Return sooner for any problems.

## 2017-07-01 NOTE — ED Provider Notes (Signed)
[facility] DEPT Provider Note   CSN: [id number] Arrival date & time: 07/01/17  1249     History   Chief Complaint Chief Complaint  Patient presents with  . Blister    Right Foot     HPI Brandon Reyes is a 56 y.o. male who presents to the ED with right foot pain. Patient states that he used a warm pack to his feet to stay warm and it was left there to long and he developed what he thinks is a burn to the bottom of the right foot. He c/o redness, pain and increased symptoms with ambulation.   HPI  Past Medical History:  Diagnosis Date  . Arthritis     Patient Active Problem List   Diagnosis Date Noted  . Essential hypertension 12/29/2016  . Arthritis of left ankle 11/17/2016    Past Surgical History:  Procedure Laterality Date  . ANKLE FRACTURE SURGERY    . INSERTION OF MESH N/A 09/26/2016   Procedure: INSERTION OF MESH;  Surgeon: Greer Pickerel, MD;  Location: [facility];  Service: General;  Laterality: N/A;  . UMBILICAL HERNIA REPAIR N/A 09/26/2016   Procedure: LAPAROSCOPIC ASSISTED UMBILICAL HERNIA REPAIR WITH MESH;  Surgeon: Greer Pickerel, MD;  Location: [facility];  Service: General;  Laterality: N/A;       Home Medications    Prior to Admission medications   Medication Sig Start Date End Date Taking? Authorizing Provider  amLODipine (NORVASC) 5 MG tablet Take 1 tablet (5 mg total) by mouth daily. 03/07/17   Golden Circle, FNP  furosemide (LASIX) 20 MG tablet Take 0.5 tablets (10 mg total) by mouth daily as needed for edema. 03/09/17   Lyndal Pulley, DO  PENNSAID 2 % SOLN APPLY TWO PUMPS TOPICALLY TO THE AFFECTED AREA TWICE DAILY 04/20/17   Lyndal Pulley, DO    Family History Family History  Problem Relation Age of Onset  . Hypertension Mother   . Diabetes Mother   . Hypertension Sister   . Hypertension Maternal Grandmother   . Hypertension Sister     Social History Social History   Tobacco Use  . Smoking status: Never Smoker   . Smokeless tobacco: Never Used  Substance Use Topics  . Alcohol use: No  . Drug use: No     Allergies   Patient has no known allergies.   Review of Systems Review of Systems  Musculoskeletal: Positive for arthralgias.  Skin: Positive for color change.  All other systems reviewed and are negative.    Physical Exam Updated Vital Signs BP 139/76 (BP Location: Right Arm)   Pulse 74   Temp 98.8 F (37.1 C) (Oral)   Resp 20   Ht 5\' 5"  (1.651 m)   Wt 104.3 kg (230 lb)   SpO2 99%   BMI 38.27 kg/m   Physical Exam  Constitutional: He appears well-developed and well-nourished. No distress.  HENT:  Head: Normocephalic.  Eyes: EOM are normal.  Neck: Neck supple.  Cardiovascular: Normal rate.  Pulmonary/Chest: Effort normal.  Musculoskeletal: Normal range of motion.       Feet:  Raised red tender area approximately 1 cm. No red streaking.   Neurological: He is alert.  Skin:  Plantar aspect of right foot at arch with raised blister area that is tender to touch.   Psychiatric: He has a normal mood and affect. His behavior is normal.  Nursing note and vitals reviewed.    ED Treatments / Results  Labs (all labs ordered are listed, but only abnormal results are displayed) Labs Reviewed - No data to display  Radiology No results found.  Procedures Procedures (including critical care time)  Medications Ordered in ED Medications  Tdap (BOOSTRIX) injection 0.5 mL (0.5 mLs Intramuscular Given 07/01/17 1508)  silver sulfADIAZINE (SILVADENE) 1 % cream (1 application Topical Given 07/01/17 1506)     Initial Impression / Assessment and Plan / ED Course  I have reviewed the triage vital signs and the nursing notes. 56 y.o. male with tender raised area to the right foot after using a heat pack to keep his feet warm stable for d/c without fever, red streaking and does not appear ill. He will return for recheck in 24 to 48 hours. Burn dressing applied with silvadene cream.     Final Clinical Impressions(s) / ED Diagnoses   Final diagnoses:  Erythema due to burn (first degree) of foot, right, initial encounter    ED Discharge Orders    None       Debroah Baller [place], NP 07/01/17 [place], [place], MD 07/02/17 [phone]

## 2017-07-02 ENCOUNTER — Emergency Department (HOSPITAL_COMMUNITY)
Admission: EM | Admit: 2017-07-02 | Discharge: 2017-07-02 | Disposition: A | Discharge status: Home / Self Care | Payer: 59 | Attending: Emergency Medicine | Admitting: Emergency Medicine

## 2017-07-02 ENCOUNTER — Encounter (HOSPITAL_COMMUNITY): Payer: Self-pay | Admitting: Emergency Medicine

## 2017-07-02 ENCOUNTER — Other Ambulatory Visit: Payer: Self-pay

## 2017-07-02 DIAGNOSIS — Z79899 Other long term (current) drug therapy: Secondary | ICD-10-CM | POA: Insufficient documentation

## 2017-07-02 DIAGNOSIS — Y33XXXD Other specified events, undetermined intent, subsequent encounter: Secondary | ICD-10-CM | POA: Insufficient documentation

## 2017-07-02 DIAGNOSIS — M79671 Pain in right foot: Secondary | ICD-10-CM | POA: Insufficient documentation

## 2017-07-02 DIAGNOSIS — I10 Essential (primary) hypertension: Secondary | ICD-10-CM | POA: Insufficient documentation

## 2017-07-02 DIAGNOSIS — Z4800 Encounter for change or removal of nonsurgical wound dressing: Secondary | ICD-10-CM | POA: Diagnosis not present

## 2017-07-02 DIAGNOSIS — S90821D Blister (nonthermal), right foot, subsequent encounter: Secondary | ICD-10-CM

## 2017-07-02 DIAGNOSIS — Z5189 Encounter for other specified aftercare: Secondary | ICD-10-CM

## 2017-07-02 DIAGNOSIS — S90821A Blister (nonthermal), right foot, initial encounter: Secondary | ICD-10-CM | POA: Diagnosis not present

## 2017-07-02 NOTE — Discharge Instructions (Addendum)
Continue to keep area clean, dry and apply dressing daily. Allow the blister to pop on its own, do not try and unroofed it or remove the skin your cells, this increases the risk for infection. If you developed surrounding redness, streaking, pus draining or worsening pain, or other new or concerning symptoms develop please return to the ED otherwise please follow-up with your primary care doctor if the blister has not resolved in a week.

## 2017-07-02 NOTE — ED Triage Notes (Signed)
Pt has a blister on bottom of r/foot. Here for wound recheck as recommended by NP yesterday.. Pt stated that blister is still intact. Using silvadene as prescribed

## 2017-07-02 NOTE — ED Provider Notes (Signed)
[facility] DEPT Provider Note   CSN: [id number] Arrival date & time: 07/02/17  1349     History   Chief Complaint Chief Complaint  Patient presents with  . Wound Check    HPI  Brandon Reyes is a 56 y.o. Male who presents for wound recheck, patient was seen in the ED yesterday with right foot pain. Patient had had a warming pack in his shoe to keep his foot warm and it got balled up under the bottom of his foot he thinks it either burned the foot or caused an area of irritation. On exam patient had a small blister on the plantar aspect of the arch of the right foot. There was no surrounding erythema or streaking. Patient reports pain has improved vastly since yesterday, he denies any fevers or chills, no redness or warmth surrounding the area. Patient reports the blister has not popped yet and there has not been any drainage. He's been able to walk on the foot without difficulty today has kept the area dressed, clean and dry.      Past Medical History:  Diagnosis Date  . Arthritis     Patient Active Problem List   Diagnosis Date Noted  . Essential hypertension 12/29/2016  . Arthritis of left ankle 11/17/2016    Past Surgical History:  Procedure Laterality Date  . ANKLE FRACTURE SURGERY    . INSERTION OF MESH N/A 09/26/2016   Procedure: INSERTION OF MESH;  Surgeon: Greer Pickerel, MD;  Location: [facility];  Service: General;  Laterality: N/A;  . UMBILICAL HERNIA REPAIR N/A 09/26/2016   Procedure: LAPAROSCOPIC ASSISTED UMBILICAL HERNIA REPAIR WITH MESH;  Surgeon: Greer Pickerel, MD;  Location: [facility];  Service: General;  Laterality: N/A;       Home Medications    Prior to Admission medications   Medication Sig Start Date End Date Taking? Authorizing Provider  amLODipine (NORVASC) 5 MG tablet Take 1 tablet (5 mg total) by mouth daily. 03/07/17   Golden Circle, FNP  furosemide (LASIX) 20 MG tablet Take 0.5 tablets (10 mg total) by mouth daily as  needed for edema. 03/09/17   Lyndal Pulley, DO  PENNSAID 2 % SOLN APPLY TWO PUMPS TOPICALLY TO THE AFFECTED AREA TWICE DAILY 04/20/17   Lyndal Pulley, DO    Family History Family History  Problem Relation Age of Onset  . Hypertension Mother   . Diabetes Mother   . Hypertension Sister   . Hypertension Maternal Grandmother   . Hypertension Sister     Social History Social History   Tobacco Use  . Smoking status: Never Smoker  . Smokeless tobacco: Never Used  Substance Use Topics  . Alcohol use: No  . Drug use: No     Allergies   Patient has no known allergies.   Review of Systems Review of Systems  Constitutional: Negative for chills and fever.  Skin: Positive for wound. Negative for color change, pallor and rash.  Neurological: Negative for weakness and numbness.     Physical Exam Updated Vital Signs There were no vitals taken for this visit.  Physical Exam  Constitutional: He appears well-developed and well-nourished. No distress.  HENT:  Head: Normocephalic and atraumatic.  Eyes: Right eye exhibits no discharge. Left eye exhibits no discharge.  Pulmonary/Chest: Effort normal. No respiratory distress.  Musculoskeletal:  2.5 cm x 1 cm blister in the middle of the arch of the right foot, no drainage, no surrounding erythema or streaking, minimal  pain with palpation (see photo below), full normal range of motion of the foot with good capillary refill and 2+ DP pulses, sensation intact.  Neurological: He is alert. Coordination normal.  Skin: Skin is warm and dry. Capillary refill takes less than 2 seconds. He is not diaphoretic.  Psychiatric: He has a normal mood and affect. His behavior is normal.  Nursing note and vitals reviewed.       ED Treatments / Results  Labs (all labs ordered are listed, but only abnormal results are displayed) Labs Reviewed - No data to display  EKG  EKG Interpretation None       Radiology No results  found.  Procedures Procedures (including critical care time)  Medications Ordered in ED Medications - No data to display   Initial Impression / Assessment and Plan / ED Course  I have reviewed the triage vital signs and the nursing notes.  Pertinent labs & imaging results that were available during my care of the patient were reviewed by me and considered in my medical decision making (see chart for details).  Patient presents for recheck of blister on the bottom of his right foot, no surrounding erythema, red streaking or drainage, patient is well-appearing and denies fevers or chills, vitals are normal here today. Right foot is neurovascularly intact, no issues with ambulation. No concern for surrounding infection, discussed continuing to keep area clean, dry and dressed, counseled patient to not popped the blister himself or try and remove the overlying skin as this increases the risk of infection. Patient to follow-up with his primary doctor if the blister has not resolved in about a week, discussed reasons to return to the ED for sooner evaluation. Patient and wife expressed understanding and are in agreement with plan.  Final Clinical Impressions(s) / ED Diagnoses   Final diagnoses:  Visit for wound check  Blister of right foot, subsequent encounter    ED Discharge Orders    None       Jacqlyn Larsen, [place] 07/02/17 1429    Gareth Morgan, MD 07/07/17 1423

## 2017-07-13 ENCOUNTER — Telehealth: Payer: Self-pay | Admitting: Family Medicine

## 2017-07-13 NOTE — Telephone Encounter (Signed)
Pt is needing a refill on furosemide (LASIX) 20 MG tablet to [facility] on [facility].

## 2017-07-13 NOTE — Telephone Encounter (Signed)
Please call patient when this has been done or if there are any issues.

## 2017-07-14 MED ORDER — FUROSEMIDE 20 MG PO TABS: 10.0000 mg | ORAL_TABLET | Freq: Every day | ORAL | 1 refills | Status: DC | PRN

## 2017-07-20 ENCOUNTER — Telehealth: Payer: Self-pay | Admitting: Family Medicine

## 2017-07-20 NOTE — Telephone Encounter (Signed)
Called wife back inform MD sent rx to [facility] on [address] on 07/14/17. Wife is going to call and have tem to transfer it to [facility]. Updated pharmacist list.../lmb

## 2017-07-20 NOTE — Telephone Encounter (Signed)
Copied from [place] [id number]. Topic: Quick Communication - See Telephone Encounter >> Jul 20, 2017  9:41 AM Cleaster Corin, NT wrote: CRM for notification. See Telephone encounter for:   07/20/17. Pt. Wife Calling back to let the doctor know that pt. Is out of lasix and Would like a refill   [place] [place] [address] Phone: [phone] Fax: [phone] Not a 24 hour pharmacy; exact hours not known

## 2017-08-01 HISTORY — PX: COLONOSCOPY: SHX174

## 2017-08-02 DIAGNOSIS — T85848A Pain due to other internal prosthetic devices, implants and grafts, initial encounter: Secondary | ICD-10-CM | POA: Diagnosis not present

## 2017-08-02 DIAGNOSIS — S82302D Unspecified fracture of lower end of left tibia, subsequent encounter for closed fracture with routine healing: Secondary | ICD-10-CM | POA: Diagnosis not present

## 2017-08-02 DIAGNOSIS — M19172 Post-traumatic osteoarthritis, left ankle and foot: Secondary | ICD-10-CM | POA: Diagnosis not present

## 2017-08-02 DIAGNOSIS — T84197A Other mechanical complication of internal fixation device of bone of left lower leg, initial encounter: Secondary | ICD-10-CM | POA: Diagnosis not present

## 2017-08-23 ENCOUNTER — Encounter: Payer: Self-pay | Admitting: Internal Medicine

## 2017-08-23 ENCOUNTER — Other Ambulatory Visit (INDEPENDENT_AMBULATORY_CARE_PROVIDER_SITE_OTHER): Payer: 59

## 2017-08-23 ENCOUNTER — Other Ambulatory Visit: Payer: Self-pay | Admitting: Family

## 2017-08-23 ENCOUNTER — Ambulatory Visit: Payer: 59 | Admitting: Internal Medicine

## 2017-08-23 DIAGNOSIS — M19072 Primary osteoarthritis, left ankle and foot: Secondary | ICD-10-CM

## 2017-08-23 DIAGNOSIS — I10 Essential (primary) hypertension: Secondary | ICD-10-CM | POA: Diagnosis not present

## 2017-08-23 DIAGNOSIS — D538 Other specified nutritional anemias: Secondary | ICD-10-CM

## 2017-08-23 DIAGNOSIS — R000 Tachycardia, unspecified: Secondary | ICD-10-CM | POA: Diagnosis not present

## 2017-08-23 DIAGNOSIS — D649 Anemia, unspecified: Secondary | ICD-10-CM | POA: Insufficient documentation

## 2017-08-23 LAB — VITAMIN B12: VITAMIN B 12: 396 pg/mL (ref 211–911)

## 2017-08-23 LAB — CBC WITH DIFFERENTIAL/PLATELET
BASOS PCT: 1 % (ref 0.0–3.0)
Basophils Absolute: 0.1 10*3/uL (ref 0.0–0.1)
EOS ABS: 0.3 10*3/uL (ref 0.0–0.7)
Eosinophils Relative: 2.7 % (ref 0.0–5.0)
HEMATOCRIT: 42.2 % (ref 39.0–52.0)
Hemoglobin: 13.7 g/dL (ref 13.0–17.0)
LYMPHS ABS: 2.8 10*3/uL (ref 0.7–4.0)
LYMPHS PCT: 26.7 % (ref 12.0–46.0)
MCHC: 32.5 g/dL (ref 30.0–36.0)
MCV: 86.6 fl (ref 78.0–100.0)
Monocytes Absolute: 1 10*3/uL (ref 0.1–1.0)
Monocytes Relative: 9.5 % (ref 3.0–12.0)
NEUTROS ABS: 6.3 10*3/uL (ref 1.4–7.7)
NEUTROS PCT: 60.1 % (ref 43.0–77.0)
PLATELETS: 275 10*3/uL (ref 150.0–400.0)
RBC: 4.87 Mil/uL (ref 4.22–5.81)
RDW: 13.6 % (ref 11.5–15.5)
WBC: 10.5 10*3/uL (ref 4.0–10.5)

## 2017-08-23 LAB — HEPATIC FUNCTION PANEL
ALT: 19 U/L (ref 0–53)
AST: 15 U/L (ref 0–37)
Albumin: 4.1 g/dL (ref 3.5–5.2)
Alkaline Phosphatase: 60 U/L (ref 39–117)
BILIRUBIN TOTAL: 0.3 mg/dL (ref 0.2–1.2)
Bilirubin, Direct: 0.1 mg/dL (ref 0.0–0.3)
Total Protein: 7.2 g/dL (ref 6.0–8.3)

## 2017-08-23 LAB — BASIC METABOLIC PANEL
BUN: 9 mg/dL (ref 6–23)
CHLORIDE: 103 meq/L (ref 96–112)
CO2: 27 mEq/L (ref 19–32)
CREATININE: 0.79 mg/dL (ref 0.40–1.50)
Calcium: 9.4 mg/dL (ref 8.4–10.5)
GFR: 130.66 mL/min (ref >60.00)
Glucose, Bld: 117 mg/dL — ABNORMAL HIGH (ref 70–99)
POTASSIUM: 3.9 meq/L (ref 3.5–5.1)
SODIUM: 138 meq/L (ref 135–145)

## 2017-08-23 LAB — URINALYSIS
Bilirubin Urine: NEGATIVE
Hgb urine dipstick: NEGATIVE
KETONES UR: NEGATIVE
Leukocytes, UA: NEGATIVE
Nitrite: NEGATIVE
PH: 7.5 (ref 5.0–8.0)
SPECIFIC GRAVITY, URINE: 1.01 (ref 1.000–1.030)
Total Protein, Urine: NEGATIVE
URINE GLUCOSE: NEGATIVE
UROBILINOGEN UA: 0.2 (ref 0.0–1.0)

## 2017-08-23 LAB — TSH: TSH: 1.51 u[IU]/mL (ref 0.35–4.50)

## 2017-08-23 LAB — VITAMIN D 25 HYDROXY (VIT D DEFICIENCY, FRACTURES): VITD: 8.39 ng/mL — AB (ref 30.00–100.00)

## 2017-08-23 MED ORDER — METOPROLOL SUCCINATE ER 25 MG PO TB24: 25.0000 mg | ORAL_TABLET | Freq: Every day | ORAL | 3 refills | Status: DC

## 2017-08-23 MED ORDER — FUROSEMIDE 20 MG PO TABS: 10.0000 mg | ORAL_TABLET | Freq: Every day | ORAL | 1 refills | Status: DC | PRN

## 2017-08-23 MED ORDER — AMLODIPINE BESYLATE 5 MG PO TABS: 5.0000 mg | ORAL_TABLET | Freq: Every day | ORAL | 3 refills | Status: DC

## 2017-08-23 NOTE — Assessment & Plan Note (Addendum)
Labs Added Toprol He is a blood donor 2/week Stop Furosemide

## 2017-08-23 NOTE — Assessment & Plan Note (Signed)
He is a blood donor 2/week Labs

## 2017-08-23 NOTE — Assessment & Plan Note (Signed)
S/p recent surgery 

## 2017-08-23 NOTE — Progress Notes (Signed)
Subjective:  Patient ID: Brandon Reyes, male    DOB: 13-Aug-1960  Age: 57 y.o. MRN: [id number]  CC: No chief complaint on file.   HPI Brandon Reyes presents for tachycardia, HTN, anemia Needs Rx refilled    Outpatient Medications Prior to Visit  Medication Sig Dispense Refill  . amLODipine (NORVASC) 5 MG tablet Take 1 tablet (5 mg total) by mouth daily. 90 tablet 0  . furosemide (LASIX) 20 MG tablet Take 0.5 tablets (10 mg total) by mouth daily as needed for edema. 90 tablet 1  . PENNSAID 2 % SOLN APPLY TWO PUMPS TOPICALLY TO THE AFFECTED AREA TWICE DAILY 112 g 1   No facility-administered medications prior to visit.     ROS Review of Systems  Constitutional: Negative for appetite change, fatigue and unexpected weight change.  HENT: Negative for congestion, nosebleeds, sneezing, sore throat and trouble swallowing.   Eyes: Negative for itching and visual disturbance.  Respiratory: Negative for cough.   Cardiovascular: Positive for palpitations. Negative for chest pain and leg swelling.  Gastrointestinal: Negative for abdominal distention, blood in stool, diarrhea and nausea.  Genitourinary: Negative for frequency and hematuria.  Musculoskeletal: Positive for arthralgias, gait problem and joint swelling. Negative for back pain and neck pain.  Skin: Negative for rash.  Neurological: Negative for dizziness, tremors, speech difficulty and weakness.  Psychiatric/Behavioral: Negative for agitation, dysphoric mood and sleep disturbance. The patient is not nervous/anxious.     Objective:  BP 128/78 (BP Location: Left Arm, Patient Position: Sitting, Cuff Size: Large)   Pulse 96   Temp 98.4 F (36.9 C) (Oral)   Ht 5\' 5"  (1.651 m)   Wt 232 lb (105.2 kg)   SpO2 98%   BMI 38.61 kg/m   BP Readings from Last 3 Encounters:  08/23/17 128/78  07/02/17 135/82  07/01/17 139/76    Wt Readings from Last 3 Encounters:  08/23/17 232 lb (105.2 kg)  07/02/17 230 lb (104.3 kg)  07/01/17  230 lb (104.3 kg)    Physical Exam  Constitutional: He is oriented to person, place, and time. He appears well-developed. No distress.  NAD  HENT:  Mouth/Throat: Oropharynx is clear and moist.  Eyes: Conjunctivae are normal. Pupils are equal, round, and reactive to light.  Neck: Normal range of motion. No JVD present. No thyromegaly present.  Cardiovascular: Normal rate, regular rhythm, normal heart sounds and intact distal pulses. Exam reveals no gallop and no friction rub.  No murmur heard. Pulmonary/Chest: Effort normal and breath sounds normal. No respiratory distress. He has no wheezes. He has no rales. He exhibits no tenderness.  Abdominal: Soft. Bowel sounds are normal. He exhibits no distension and no mass. There is no tenderness. There is no rebound and no guarding.  Musculoskeletal: Normal range of motion. He exhibits edema and tenderness.  Lymphadenopathy:    He has no cervical adenopathy.  Neurological: He is alert and oriented to person, place, and time. He has normal reflexes. No cranial nerve deficit. He exhibits normal muscle tone. He displays a negative Romberg sign. Coordination and gait normal.  Skin: Skin is warm and dry. No rash noted.  Psychiatric: He has a normal mood and affect. His behavior is normal. Judgment and thought content normal.  LLE in a surgical boot  Lab Results  Component Value Date   WBC 10.8 (H) 09/21/2016   HGB 12.8 (L) 09/21/2016   HCT 40.8 09/21/2016   PLT 224 09/21/2016   GLUCOSE 101 (H) 03/06/2009   ALT 50  03/06/2009   AST 56 (H) 03/06/2009   NA 137 03/06/2009   K 3.2 (L) 03/06/2009   CL 100 03/06/2009   CREATININE 0.95 03/06/2009   BUN 10 03/06/2009   CO2 29 03/06/2009    No results found.  Assessment & Plan:   There are no diagnoses linked to this encounter. I am having Brandon Reyes maintain his amLODipine, PENNSAID, and furosemide.  No orders of the defined types were placed in this encounter.    Follow-up: No  Follow-up on file.  Walker Kehr, MD

## 2017-08-23 NOTE — Assessment & Plan Note (Signed)
Added Toprol due to tachycardia Labs

## 2017-08-24 LAB — IRON,TIBC AND FERRITIN PANEL
%SAT: 17 % (calc) (ref 15–60)
Ferritin: 267 ng/mL (ref 20–380)
Iron: 55 ug/dL (ref 50–180)
TIBC: 321 ug/dL (ref 250–425)

## 2017-08-25 ENCOUNTER — Other Ambulatory Visit: Payer: Self-pay | Admitting: Internal Medicine

## 2017-08-25 ENCOUNTER — Telehealth: Payer: Self-pay | Admitting: Internal Medicine

## 2017-08-25 MED ORDER — VITAMIN D3 1.25 MG (50000 UT) PO CAPS: 1.0000 | ORAL_CAPSULE | ORAL | 0 refills | Status: DC

## 2017-08-25 MED ORDER — VITAMIN D3 50 MCG (2000 UT) PO CAPS: 2000.0000 [IU] | ORAL_CAPSULE | Freq: Every day | ORAL | 3 refills | Status: DC

## 2017-08-25 NOTE — Telephone Encounter (Signed)
Please advise 

## 2017-08-25 NOTE — Telephone Encounter (Signed)
pls see lab message - low Vit D Thx

## 2017-08-25 NOTE — Telephone Encounter (Signed)
Copied from [place] #[id number]. Topic: General - Other >> Aug 25, 2017  9:03 AM Yvette Rack wrote: Reason for CRM: patient calling about lab results

## 2017-08-26 NOTE — Telephone Encounter (Signed)
Pt was informed when he walked into office

## 2017-10-13 ENCOUNTER — Ambulatory Visit: Payer: 59 | Admitting: Nurse Practitioner

## 2017-10-26 DIAGNOSIS — T85848D Pain due to other internal prosthetic devices, implants and grafts, subsequent encounter: Secondary | ICD-10-CM | POA: Diagnosis not present

## 2017-10-26 DIAGNOSIS — M19172 Post-traumatic osteoarthritis, left ankle and foot: Secondary | ICD-10-CM | POA: Diagnosis not present

## 2017-11-07 DIAGNOSIS — Z86010 Personal history of colonic polyps: Secondary | ICD-10-CM | POA: Diagnosis not present

## 2017-11-07 DIAGNOSIS — Z1211 Encounter for screening for malignant neoplasm of colon: Secondary | ICD-10-CM | POA: Diagnosis not present

## 2017-11-08 DIAGNOSIS — Z9889 Other specified postprocedural states: Secondary | ICD-10-CM | POA: Diagnosis not present

## 2017-11-08 DIAGNOSIS — R262 Difficulty in walking, not elsewhere classified: Secondary | ICD-10-CM | POA: Diagnosis not present

## 2017-11-08 DIAGNOSIS — M25672 Stiffness of left ankle, not elsewhere classified: Secondary | ICD-10-CM | POA: Diagnosis not present

## 2017-11-15 DIAGNOSIS — M25672 Stiffness of left ankle, not elsewhere classified: Secondary | ICD-10-CM | POA: Diagnosis not present

## 2017-11-15 DIAGNOSIS — T85848D Pain due to other internal prosthetic devices, implants and grafts, subsequent encounter: Secondary | ICD-10-CM | POA: Diagnosis not present

## 2017-11-15 DIAGNOSIS — R262 Difficulty in walking, not elsewhere classified: Secondary | ICD-10-CM | POA: Diagnosis not present

## 2017-11-24 DIAGNOSIS — R262 Difficulty in walking, not elsewhere classified: Secondary | ICD-10-CM | POA: Diagnosis not present

## 2017-11-24 DIAGNOSIS — M25672 Stiffness of left ankle, not elsewhere classified: Secondary | ICD-10-CM | POA: Diagnosis not present

## 2017-11-24 DIAGNOSIS — T85848D Pain due to other internal prosthetic devices, implants and grafts, subsequent encounter: Secondary | ICD-10-CM | POA: Diagnosis not present

## 2017-11-27 DIAGNOSIS — M25672 Stiffness of left ankle, not elsewhere classified: Secondary | ICD-10-CM | POA: Diagnosis not present

## 2017-11-27 DIAGNOSIS — T85848D Pain due to other internal prosthetic devices, implants and grafts, subsequent encounter: Secondary | ICD-10-CM | POA: Diagnosis not present

## 2017-11-27 DIAGNOSIS — R262 Difficulty in walking, not elsewhere classified: Secondary | ICD-10-CM | POA: Diagnosis not present

## 2017-11-30 DIAGNOSIS — R262 Difficulty in walking, not elsewhere classified: Secondary | ICD-10-CM | POA: Diagnosis not present

## 2017-11-30 DIAGNOSIS — M25672 Stiffness of left ankle, not elsewhere classified: Secondary | ICD-10-CM | POA: Diagnosis not present

## 2017-11-30 DIAGNOSIS — T85848D Pain due to other internal prosthetic devices, implants and grafts, subsequent encounter: Secondary | ICD-10-CM | POA: Diagnosis not present

## 2017-12-01 DIAGNOSIS — T85848D Pain due to other internal prosthetic devices, implants and grafts, subsequent encounter: Secondary | ICD-10-CM | POA: Diagnosis not present

## 2017-12-04 DIAGNOSIS — T85848D Pain due to other internal prosthetic devices, implants and grafts, subsequent encounter: Secondary | ICD-10-CM | POA: Diagnosis not present

## 2017-12-04 DIAGNOSIS — R262 Difficulty in walking, not elsewhere classified: Secondary | ICD-10-CM | POA: Diagnosis not present

## 2017-12-04 DIAGNOSIS — M25672 Stiffness of left ankle, not elsewhere classified: Secondary | ICD-10-CM | POA: Diagnosis not present

## 2017-12-07 DIAGNOSIS — M25672 Stiffness of left ankle, not elsewhere classified: Secondary | ICD-10-CM | POA: Diagnosis not present

## 2017-12-07 DIAGNOSIS — Z9889 Other specified postprocedural states: Secondary | ICD-10-CM | POA: Diagnosis not present

## 2017-12-07 DIAGNOSIS — R262 Difficulty in walking, not elsewhere classified: Secondary | ICD-10-CM | POA: Diagnosis not present

## 2017-12-13 DIAGNOSIS — R262 Difficulty in walking, not elsewhere classified: Secondary | ICD-10-CM | POA: Diagnosis not present

## 2017-12-13 DIAGNOSIS — Z9889 Other specified postprocedural states: Secondary | ICD-10-CM | POA: Diagnosis not present

## 2017-12-13 DIAGNOSIS — M25672 Stiffness of left ankle, not elsewhere classified: Secondary | ICD-10-CM | POA: Diagnosis not present

## 2017-12-14 ENCOUNTER — Encounter: Payer: Self-pay | Admitting: Nurse Practitioner

## 2017-12-14 ENCOUNTER — Ambulatory Visit: Payer: 59 | Admitting: Nurse Practitioner

## 2017-12-14 VITALS — BP 112/72 | HR 76 | Temp 98.0°F | Resp 16 | Ht 65.0 in | Wt 224.0 lb

## 2017-12-14 DIAGNOSIS — I10 Essential (primary) hypertension: Secondary | ICD-10-CM | POA: Diagnosis not present

## 2017-12-14 DIAGNOSIS — R000 Tachycardia, unspecified: Secondary | ICD-10-CM

## 2017-12-14 MED ORDER — AMLODIPINE BESYLATE 5 MG PO TABS: 5.0000 mg | ORAL_TABLET | Freq: Every day | ORAL | 1 refills | Status: DC

## 2017-12-14 MED ORDER — METOPROLOL SUCCINATE ER 25 MG PO TB24: 25.0000 mg | ORAL_TABLET | Freq: Every day | ORAL | 1 refills | Status: DC

## 2017-12-14 NOTE — Progress Notes (Signed)
Name: Brandon Reyes   MRN: [id number]    DOB: [birth year]   Date:12/14/2017       Progress Note  Subjective  Chief Complaint  Chief Complaint  Patient presents with  . Establish Care    hypertension    HPI Brandon Reyes is here today to establish care. We will follow up on hypertension and also discuss tachycardia.  Hypertension -maintained on amlodipine 5, metoprolol 25 daily Reports daily medication compliance without adverse medication effects. Reports readings at home 120s-140s/80s when he checks occasionally. Denies headaches, vision changes, chest pain, shortness of breath. Reports some chronic swelling to left ankle since surgery.  BP Readings from Last 3 Encounters:  12/14/17 112/72  08/23/17 128/78  07/02/17 135/82   Tachycardia- This is not a new problem  He was seen for this in January with labs which were normal aside from low vitamin D His lasix was stopped and metotoprolol was started He has been taking metoprolol as instructed and Continues to feel his heart racing occasionally, says "it feels like my heart takes off for a few seconds sometimes"  The episodes do not seem to be related to any specific activity Denies weakness, dizziness, syncope, cough.    Patient Active Problem List   Diagnosis Date Noted  . Sinus tachycardia 08/23/2017  . Anemia 08/23/2017  . Essential hypertension 12/29/2016  . Arthritis of left ankle 11/17/2016    Past Surgical History:  Procedure Laterality Date  . ANKLE FRACTURE SURGERY    . INSERTION OF MESH N/A 09/26/2016   Procedure: INSERTION OF MESH;  Surgeon: Greer Pickerel, MD;  Location: [facility];  Service: General;  Laterality: N/A;  . UMBILICAL HERNIA REPAIR N/A 09/26/2016   Procedure: LAPAROSCOPIC ASSISTED UMBILICAL HERNIA REPAIR WITH MESH;  Surgeon: Greer Pickerel, MD;  Location: [facility];  Service: General;  Laterality: N/A;    Family History  Problem Relation Age of Onset  . Hypertension Mother   . Diabetes Mother   . Hypertension  Sister   . Hypertension Maternal Grandmother   . Hypertension Sister     Social History   Socioeconomic History  . Marital status: Married    Spouse name: Not on file  . Number of children: 0  . Years of education: 57  . Highest education level: Not on file  Occupational History  . Occupation: Unemployed  Social Needs  . Financial resource strain: Not on file  . Food insecurity:    Worry: Not on file    Inability: Not on file  . Transportation needs:    Medical: Not on file    Non-medical: Not on file  Tobacco Use  . Smoking status: Never Smoker  . Smokeless tobacco: Never Used  Substance and Sexual Activity  . Alcohol use: No  . Drug use: No  . Sexual activity: Not on file  Lifestyle  . Physical activity:    Days per week: Not on file    Minutes per session: Not on file  . Stress: Not on file  Relationships  . Social connections:    Talks on phone: Not on file    Gets together: Not on file    Attends religious service: Not on file    Active member of club or organization: Not on file    Attends meetings of clubs or organizations: Not on file    Relationship status: Not on file  . Intimate partner violence:    Fear of current or ex partner: Not on file  Emotionally abused: Not on file    Physically abused: Not on file    Forced sexual activity: Not on file  Other Topics Concern  . Not on file  Social History Narrative   Fun/Hobby - Travel      Current Outpatient Medications:  .  amLODipine (NORVASC) 5 MG tablet, Take 1 tablet (5 mg total) by mouth daily., Disp: 90 tablet, Rfl: 3 .  Cholecalciferol (VITAMIN D3) 2000 units capsule, Take 1 capsule (2,000 Units total) by mouth daily., Disp: 100 capsule, Rfl: 3 .  metoprolol succinate (TOPROL-XL) 25 MG 24 hr tablet, Take 1 tablet (25 mg total) by mouth daily., Disp: 90 tablet, Rfl: 3  No Known Allergies   ROS See HPI  Objective  Vitals:   12/14/17 1452  BP: 112/72  Pulse: 76  Resp: 16  Temp: 98 F  (36.7 C)  TempSrc: Oral  SpO2: 96%  Weight: 224 lb (101.6 kg)  Height: 5\' 5"  (1.651 m)   Body mass index is 37.28 kg/m.  Physical Exam Vital signs reviewed. Constitutional: Patient appears well-developed and well-nourished. No distress.  HENT: Head: Normocephalic and atraumatic. Nose: Nose normal. Mouth/Throat: Oropharynx is clear and moist. No oropharyngeal exudate.  Eyes: Conjunctivae and EOM are normal. Pupils are equal, round, and reactive to light. No scleral icterus.  Neck: Normal range of motion. Neck supple.  Cardiovascular: Normal rate, regular rhythm and normal heart sounds.  No murmur heard. No BLE edema. Distal pulses intact. Pulmonary/Chest: Effort normal and breath sounds normal. No respiratory distress. Neurological: He is alert and oriented to person, place, and time.  Coordination, balance, strength, speech and gait are normal.  Skin: Skin is warm and dry. No rash noted. No erythema.  Psychiatric: Patient has a normal mood and affect. behavior is normal. Judgment and thought content normal.   Assessment & Plan RTC in 3 months for CPE Labs reviewed from 08/23/17  Sinus tachycardia asymptomatic with no acute changes on EKG today Recommend referral to cardiology for further evaluation and management and he is agreeabl - EKG 12-Lead: I have personally reviewed the EKG tracing and agree with computerized printout as noted: sinus rhythym with no acute abnormality, abnormal EKG No old ekg available for comparison.  Return precautions including calling 911 for chest pain or shortness of breath were discussed. - Ambulatory referral to Cardiology

## 2017-12-14 NOTE — Assessment & Plan Note (Signed)
Stable, continue current meds 

## 2017-12-14 NOTE — Patient Instructions (Signed)
I have placed a referral to cardiology. Our office will call you to schedule this appointment. You should hear from our office in 7-10 days.  Please return to see me in about 3 months for annual physical with fasting lab work.   Sinus Tachycardia Sinus tachycardia is a kind of fast heartbeat. In sinus tachycardia, the heart beats more than 100 times a minute. Sinus tachycardia starts in a part of the heart called the sinus node. Sinus tachycardia may be harmless, or it may be a sign of a serious condition. What are the causes? This condition may be caused by:  Exercise or exertion.  A fever.  Pain.  Loss of body fluids (dehydration).  Severe bleeding (hemorrhage).  Anxiety and stress.  Certain substances, including: ? Alcohol. ? Caffeine. ? Tobacco and nicotine products. ? Diet pills. ? Illegal drugs.  Medical conditions including: ? Heart disease. ? An infection. ? An overactive thyroid (hyperthyroidism). ? A lack of red blood cells (anemia).  What are the signs or symptoms? Symptoms of this condition include:  A feeling that the heart is beating quickly (palpitations).  Suddenly noticing your heartbeat (cardiac awareness).  Dizziness.  Tiredness (fatigue).  Shortness of breath.  Chest pain.  Nausea.  Fainting.  How is this diagnosed? This condition is diagnosed with:  A physical exam.  Other tests, such as: ? Blood tests. ? An electrocardiogram (ECG). This test measures the electrical activity of the heart. ? Holter monitoring. For this test, you wear a device that records your heartbeat for one or more days.  You may be referred to a heart specialist (cardiologist). How is this treated? Treatment for this condition depends on the cause or underlying condition. Treatment may involve:  Treating the underlying condition.  Taking new medicines or changing your current medicines as told by your health care provider.  Making changes to your diet or  lifestyle.  Practicing relaxation methods.  Follow these instructions at home: Lifestyle  Do not use any products that contain nicotine or tobacco, such as cigarettes and e-cigarettes. If you need help quitting, ask your health care provider.  Learn relaxation methods, like deep breathing, to help you when you get stressed or anxious.  Do not use illegal drugs, such as cocaine.  Do not abuse alcohol. Limit alcohol intake to no more than 1 drink a day for non-pregnant women and 2 drinks a day for men. One drink equals 12 oz of beer, 5 oz of wine, or 1 oz of hard liquor.  Find time to rest and relax often. This reduces stress.  Avoid: ? Caffeine. ? Stimulants such as over-the-counter diet pills or pills that help you to stay awake. ? Situations that cause anxiety or stress. General instructions  Drink enough fluids to keep your urine clear or pale yellow.  Take over-the-counter and prescription medicines only as told by your health care provider.  Keep all follow-up visits as told by your health care provider. This is important. Contact a health care provider if:  You have a fever.  You have vomiting or diarrhea that keeps happening (is persistent). Get help right away if:  You have pain in your chest, upper arms, jaw, or neck.  You become weak or dizzy.  You feel faint.  You have palpitations that do not go away. This information is not intended to replace advice given to you by your health care provider. Make sure you discuss any questions you have with your health care provider. Document Released:  08/25/2004 Document Revised: 02/13/2016 Document Reviewed: 01/30/2015 Elsevier Interactive Patient Education  [employer].

## 2017-12-15 DIAGNOSIS — M25672 Stiffness of left ankle, not elsewhere classified: Secondary | ICD-10-CM | POA: Diagnosis not present

## 2017-12-15 DIAGNOSIS — Z9889 Other specified postprocedural states: Secondary | ICD-10-CM | POA: Diagnosis not present

## 2017-12-15 DIAGNOSIS — R262 Difficulty in walking, not elsewhere classified: Secondary | ICD-10-CM | POA: Diagnosis not present

## 2017-12-18 DIAGNOSIS — M25672 Stiffness of left ankle, not elsewhere classified: Secondary | ICD-10-CM | POA: Diagnosis not present

## 2017-12-18 DIAGNOSIS — Z9889 Other specified postprocedural states: Secondary | ICD-10-CM | POA: Diagnosis not present

## 2017-12-18 DIAGNOSIS — R262 Difficulty in walking, not elsewhere classified: Secondary | ICD-10-CM | POA: Diagnosis not present

## 2017-12-18 NOTE — Progress Notes (Signed)
Gershon Crane NP Reason for referral-palpitations and hypertension  HPI: 57 yo male for evaluation of palpitations and hypertension at request of Caesar Chestnut NP. Labs 1/9 showed normal TSH and hgb 13.7. BUN 9 and Cr 0.79.  Patient typically does not have dyspnea on exertion, orthopnea, PND, syncope or exertional chest pain.  In the recent past he has had 2 episodes of palpitations.  One occurred as he was getting blood drawn and the second occurred when he awakened suddenly from sleep and was dreaming.  He had brief fluttering lasting seconds that resolved.  He otherwise has been asymptomatic.  Cardiology asked to evaluate.  Current Outpatient Medications  Medication Sig Dispense Refill  . amLODipine (NORVASC) 5 MG tablet Take 1 tablet (5 mg total) by mouth daily. 90 tablet 1  . Cholecalciferol (VITAMIN D3) 2000 units capsule Take 1 capsule (2,000 Units total) by mouth daily. 100 capsule 3  . metoprolol succinate (TOPROL-XL) 25 MG 24 hr tablet Take 1 tablet (25 mg total) by mouth daily. 90 tablet 1   No current facility-administered medications for this visit.     No Known Allergies   Past Medical History:  Diagnosis Date  . Arthritis   . Hypertension     Past Surgical History:  Procedure Laterality Date  . ANKLE FRACTURE SURGERY    . INSERTION OF MESH N/A 09/26/2016   Procedure: INSERTION OF MESH;  Surgeon: Greer Pickerel, MD;  Location: [facility];  Service: General;  Laterality: N/A;  . UMBILICAL HERNIA REPAIR N/A 09/26/2016   Procedure: LAPAROSCOPIC ASSISTED UMBILICAL HERNIA REPAIR WITH MESH;  Surgeon: Greer Pickerel, MD;  Location: [facility];  Service: General;  Laterality: N/A;    Social History   Socioeconomic History  . Marital status: Married    Spouse name: Not on file  . Number of children: 0  . Years of education: 87  . Highest education level: Not on file  Occupational History  . Occupation: Unemployed  Social Needs  . Financial resource strain: Not  on file  . Food insecurity:    Worry: Not on file    Inability: Not on file  . Transportation needs:    Medical: Not on file    Non-medical: Not on file  Tobacco Use  . Smoking status: Never Smoker  . Smokeless tobacco: Never Used  Substance and Sexual Activity  . Alcohol use: No  . Drug use: No  . Sexual activity: Not on file  Lifestyle  . Physical activity:    Days per week: Not on file    Minutes per session: Not on file  . Stress: Not on file  Relationships  . Social connections:    Talks on phone: Not on file    Gets together: Not on file    Attends religious service: Not on file    Active member of club or organization: Not on file    Attends meetings of clubs or organizations: Not on file    Relationship status: Not on file  . Intimate partner violence:    Fear of current or ex partner: Not on file    Emotionally abused: Not on file    Physically abused: Not on file    Forced sexual activity: Not on file  Other Topics Concern  . Not on file  Social History Narrative   Fun/Hobby - Travel     Family History  Problem Relation Age of Onset  . Hypertension Mother   . Diabetes Mother   .  Hypertension Sister   . Hypertension Maternal Grandmother   . Hypertension Sister     ROS: Residual pain and ankle from previous fracture repair but no fevers or chills, productive cough, hemoptysis, dysphasia, odynophagia, melena, hematochezia, dysuria, hematuria, rash, seizure activity, orthopnea, PND, pedal edema, claudication. Remaining systems are negative.  Physical Exam:   Blood pressure 110/74, pulse 75.  General:  Well developed/well obese in NAD Skin warm/dry Patient not depressed No peripheral clubbing Back-normal HEENT-normal/normal eyelids Neck supple/normal carotid upstroke bilaterally; no bruits; no JVD; no thyromegaly chest - CTA/ normal expansion CV - RRR/normal S1 and S2; no murmurs, rubs or gallops;  PMI nondisplaced Abdomen -NT/ND, no HSM, no mass, +  bowel sounds, no bruit 2+ femoral pulses, no bruits Ext-no edema; left foot in brace. Neuro-grossly nonfocal   ECG -Dec 14, 2017-normal sinus rhythm with nonspecific ST changes.  Personally reviewed  A/P  1 palpitations-patient has had 2 episodes of palpitations that were brief.  One associated with getting his blood drawn and another after awakening suddenly from sleep while dreaming.  Otherwise he is asymptomatic with no dyspnea, chest pain and he has no history of syncope.  I have elected not to pursue further evaluation at this point.  If he has more frequent episodes in the future we can consider an event monitor and an echocardiogram.  Note his TSH was normal recently.  2 hypertension-blood pressure is controlled.  Continue present medications.  3 chronic arthritis/pain and ankle from prior surgery-further management per orthopedics.  Kirk Ruths, MD

## 2017-12-19 ENCOUNTER — Encounter: Payer: Self-pay | Admitting: Cardiology

## 2017-12-19 ENCOUNTER — Ambulatory Visit: Payer: 59 | Admitting: Cardiology

## 2017-12-19 VITALS — BP 110/74 | HR 75

## 2017-12-19 DIAGNOSIS — I10 Essential (primary) hypertension: Secondary | ICD-10-CM | POA: Diagnosis not present

## 2017-12-19 DIAGNOSIS — R002 Palpitations: Secondary | ICD-10-CM | POA: Diagnosis not present

## 2017-12-19 NOTE — Patient Instructions (Signed)
Your physician recommends that you schedule a follow-up appointment in: AS NEEDED  

## 2017-12-22 ENCOUNTER — Encounter: Payer: Self-pay | Admitting: Internal Medicine

## 2017-12-22 DIAGNOSIS — D123 Benign neoplasm of transverse colon: Secondary | ICD-10-CM | POA: Diagnosis not present

## 2017-12-22 DIAGNOSIS — K621 Rectal polyp: Secondary | ICD-10-CM | POA: Diagnosis not present

## 2017-12-22 DIAGNOSIS — Z1211 Encounter for screening for malignant neoplasm of colon: Secondary | ICD-10-CM | POA: Diagnosis not present

## 2017-12-22 DIAGNOSIS — K635 Polyp of colon: Secondary | ICD-10-CM | POA: Diagnosis not present

## 2017-12-22 DIAGNOSIS — D128 Benign neoplasm of rectum: Secondary | ICD-10-CM | POA: Diagnosis not present

## 2017-12-29 DIAGNOSIS — M25672 Stiffness of left ankle, not elsewhere classified: Secondary | ICD-10-CM | POA: Diagnosis not present

## 2017-12-29 DIAGNOSIS — Z9889 Other specified postprocedural states: Secondary | ICD-10-CM | POA: Diagnosis not present

## 2017-12-29 DIAGNOSIS — R262 Difficulty in walking, not elsewhere classified: Secondary | ICD-10-CM | POA: Diagnosis not present

## 2018-01-02 DIAGNOSIS — M19172 Post-traumatic osteoarthritis, left ankle and foot: Secondary | ICD-10-CM | POA: Diagnosis not present

## 2018-03-16 ENCOUNTER — Encounter: Payer: Self-pay | Admitting: Nurse Practitioner

## 2018-03-16 ENCOUNTER — Other Ambulatory Visit (INDEPENDENT_AMBULATORY_CARE_PROVIDER_SITE_OTHER): Payer: 59

## 2018-03-16 ENCOUNTER — Ambulatory Visit (INDEPENDENT_AMBULATORY_CARE_PROVIDER_SITE_OTHER): Payer: 59 | Admitting: Nurse Practitioner

## 2018-03-16 ENCOUNTER — Telehealth: Payer: Self-pay | Admitting: Nurse Practitioner

## 2018-03-16 VITALS — BP 122/78 | HR 65 | Temp 98.5°F | Resp 16 | Ht 65.0 in | Wt 223.0 lb

## 2018-03-16 DIAGNOSIS — Z1159 Encounter for screening for other viral diseases: Secondary | ICD-10-CM

## 2018-03-16 DIAGNOSIS — Z0001 Encounter for general adult medical examination with abnormal findings: Secondary | ICD-10-CM | POA: Insufficient documentation

## 2018-03-16 DIAGNOSIS — Z125 Encounter for screening for malignant neoplasm of prostate: Secondary | ICD-10-CM | POA: Diagnosis not present

## 2018-03-16 DIAGNOSIS — Z114 Encounter for screening for human immunodeficiency virus [HIV]: Secondary | ICD-10-CM

## 2018-03-16 DIAGNOSIS — Z9189 Other specified personal risk factors, not elsewhere classified: Secondary | ICD-10-CM

## 2018-03-16 DIAGNOSIS — R7309 Other abnormal glucose: Secondary | ICD-10-CM

## 2018-03-16 DIAGNOSIS — E559 Vitamin D deficiency, unspecified: Secondary | ICD-10-CM | POA: Diagnosis not present

## 2018-03-16 DIAGNOSIS — Z13220 Encounter for screening for lipoid disorders: Secondary | ICD-10-CM

## 2018-03-16 DIAGNOSIS — I10 Essential (primary) hypertension: Secondary | ICD-10-CM

## 2018-03-16 LAB — VITAMIN D 25 HYDROXY (VIT D DEFICIENCY, FRACTURES): VITD: 21.42 ng/mL — ABNORMAL LOW (ref 30.00–100.00)

## 2018-03-16 LAB — LIPID PANEL
CHOL/HDL RATIO: 5
Cholesterol: 201 mg/dL — ABNORMAL HIGH (ref 0–200)
HDL: 40.1 mg/dL (ref >39.00)
LDL CALC: 143 mg/dL — AB (ref 0–99)
NonHDL: 161.06
Triglycerides: 89 mg/dL (ref 0.0–149.0)
VLDL: 17.8 mg/dL (ref 0.0–40.0)

## 2018-03-16 LAB — HEMOGLOBIN A1C: Hgb A1c MFr Bld: 6.6 % — ABNORMAL HIGH (ref 4.6–6.5)

## 2018-03-16 LAB — PSA: PSA: 0.33 ng/mL (ref 0.10–4.00)

## 2018-03-16 MED ORDER — AMLODIPINE BESYLATE 5 MG PO TABS: 5.0000 mg | ORAL_TABLET | Freq: Every day | ORAL | 1 refills | Status: DC

## 2018-03-16 MED ORDER — METOPROLOL SUCCINATE ER 25 MG PO TB24: 25.0000 mg | ORAL_TABLET | Freq: Every day | ORAL | 1 refills | Status: DC

## 2018-03-16 NOTE — Telephone Encounter (Signed)
Copied from [place] [id number]. Topic: Inquiry >> Mar 16, 2018  1:26 PM Margot Ables wrote: Reason for CRM: pt states he was told to call after 1pm for lab results if he hadn't received a call

## 2018-03-16 NOTE — Assessment & Plan Note (Signed)
Stable Continue current medications Refills sent today - amLODipine (NORVASC) 5 MG tablet; Take 1 tablet (5 mg total) by mouth daily.  Dispense: 90 tablet; Refill: 1 - metoprolol succinate (TOPROL-XL) 25 MG 24 hr tablet; Take 1 tablet (25 mg total) by mouth daily.  Dispense: 90 tablet; Refill: 1

## 2018-03-16 NOTE — Patient Instructions (Addendum)
Please head downstairs for lab work. If any of your test results are critically abnormal, you will be contacted right away. Otherwise, I will contact you within a week about your test results and follow up recommendations  I will plan to see you back in 1 year for your annual physical or sooner if needed.    Health Maintenance, Male A healthy lifestyle and preventive care is important for your health and wellness. Ask your health care provider about what schedule of regular examinations is right for you. What should I know about weight and diet? Eat a Healthy Diet  Eat plenty of vegetables, fruits, whole grains, low-fat dairy products, and lean protein.  Do not eat a lot of foods high in solid fats, added sugars, or salt.  Maintain a Healthy Weight Regular exercise can help you achieve or maintain a healthy weight. You should:  Do at least 150 minutes of exercise each week. The exercise should increase your heart rate and make you sweat (moderate-intensity exercise).  Do strength-training exercises at least twice a week.  Watch Your Levels of Cholesterol and Blood Lipids  Have your blood tested for lipids and cholesterol every 5 years starting at 56 years of age. If you are at high risk for heart disease, you should start having your blood tested when you are 56 years old. You may need to have your cholesterol levels checked more often if: ? Your lipid or cholesterol levels are high. ? You are older than 56 years of age. ? You are at high risk for heart disease.  What should I know about cancer screening? Many types of cancers can be detected early and may often be prevented. Lung Cancer  You should be screened every year for lung cancer if: ? You are a current smoker who has smoked for at least 30 years. ? You are a former smoker who has quit within the past 15 years.  Talk to your health care provider about your screening options, when you should start screening, and how  often you should be screened.  Colorectal Cancer  Routine colorectal cancer screening usually begins at 56 years of age and should be repeated every 5-10 years until you are 56 years old. You may need to be screened more often if early forms of precancerous polyps or small growths are found. Your health care provider may recommend screening at an earlier age if you have risk factors for colon cancer.  Your health care provider may recommend using home test kits to check for hidden blood in the stool.  A small camera at the end of a tube can be used to examine your colon (sigmoidoscopy or colonoscopy). This checks for the earliest forms of colorectal cancer.  Prostate and Testicular Cancer  Depending on your age and overall health, your health care provider may do certain tests to screen for prostate and testicular cancer.  Talk to your health care provider about any symptoms or concerns you have about testicular or prostate cancer.  Skin Cancer  Check your skin from head to toe regularly.  Tell your health care provider about any new moles or changes in moles, especially if: ? There is a change in a mole's size, shape, or color. ? You have a mole that is larger than a pencil eraser.  Always use sunscreen. Apply sunscreen liberally and repeat throughout the day.  Protect yourself by wearing long sleeves, pants, a wide-brimmed hat, and sunglasses when outside.  What should I know  about heart disease, diabetes, and high blood pressure?  If you are 59-10 years of age, have your blood pressure checked every 3-5 years. If you are 67 years of age or older, have your blood pressure checked every year. You should have your blood pressure measured twice-once when you are at a hospital or clinic, and once when you are not at a hospital or clinic. Record the average of the two measurements. To check your blood pressure when you are not at a hospital or clinic, you can use: ? An automated blood  pressure machine at a pharmacy. ? A home blood pressure monitor.  Talk to your health care provider about your target blood pressure.  If you are between 73-23 years old, ask your health care provider if you should take aspirin to prevent heart disease.  Have regular diabetes screenings by checking your fasting blood sugar level. ? If you are at a normal weight and have a low risk for diabetes, have this test once every three years after the age of 67. ? If you are overweight and have a high risk for diabetes, consider being tested at a younger age or more often.  A one-time screening for abdominal aortic aneurysm (AAA) by ultrasound is recommended for men aged 39-75 years who are current or former smokers. What should I know about preventing infection? Hepatitis B If you have a higher risk for hepatitis B, you should be screened for this virus. Talk with your health care provider to find out if you are at risk for hepatitis B infection. Hepatitis C Blood testing is recommended for:  Everyone born from [birth year] through 1964.  Anyone with known risk factors for hepatitis C.  Sexually Transmitted Diseases (STDs)  You should be screened each year for STDs including gonorrhea and chlamydia if: ? You are sexually active and are younger than 56 years of age. ? You are older than 56 years of age and your health care provider tells you that you are at risk for this type of infection. ? Your sexual activity has changed since you were last screened and you are at an increased risk for chlamydia or gonorrhea. Ask your health care provider if you are at risk.  Talk with your health care provider about whether you are at high risk of being infected with HIV. Your health care provider may recommend a prescription medicine to help prevent HIV infection.  What else can I do?  Schedule regular health, dental, and eye exams.  Stay current with your vaccines (immunizations).  Do not use any tobacco  products, such as cigarettes, chewing tobacco, and e-cigarettes. If you need help quitting, ask your health care provider.  Limit alcohol intake to no more than 2 drinks per day. One drink equals 12 ounces of beer, 5 ounces of wine, or 1 ounces of hard liquor.  Do not use street drugs.  Do not share needles.  Ask your health care provider for help if you need support or information about quitting drugs.  Tell your health care provider if you often feel depressed.  Tell your health care provider if you have ever been abused or do not feel safe at home. This information is not intended to replace advice given to you by your health care provider. Make sure you discuss any questions you have with your health care provider. Document Released: 01/14/2008 Document Revised: 03/16/2016 Document Reviewed: 04/21/2015 Elsevier Interactive Patient Education  [employer].

## 2018-03-16 NOTE — Progress Notes (Signed)
Name: Brandon Reyes   MRN: [id number]    DOB: [birth year]   Date:03/16/2018       Progress Note  Subjective  Chief Complaint  Chief Complaint  Patient presents with  . CPE    not fasting    HPI  Patient presents for annual CPE.  USPSTF grade A and B recommendations:  Diet, Exercise: not consistently dieting or exercising  Depression:  Depression screen [facility] 2/9 03/16/2018  Decreased Interest 0  PHQ - 2 Score 0   Hypertension -maintained on amlodipine 5, metoprolol succinate 25 daily Reports daily medication compliance without noted adverse medication effects.  BP Readings from Last 3 Encounters:  03/16/18 122/78  12/19/17 110/74  12/14/17 112/72   Obesity: Wt Readings from Last 3 Encounters:  03/16/18 223 lb (101.2 kg)  12/14/17 224 lb (101.6 kg)  08/23/17 232 lb (105.2 kg)   BMI Readings from Last 3 Encounters:  03/16/18 37.11 kg/m  12/14/17 37.28 kg/m  08/23/17 38.61 kg/m   Lipids:  No results found for: CHOL No results found for: HDL No results found for: LDLCALC No results found for: TRIG No results found for: CHOLHDL No results found for: LDLDIRECT  Glucose:  Glucose, Bld  Date Value Ref Range Status  08/23/2017 117 (H) 70 - 99 mg/dL Final  03/06/2009 101 (H) 70 - 99 mg/dL Final    Alcohol: no  Tobacco use: no, never  HIV, hep C screening: screenings ordered today  Skin cancer: does not routinely wear sunscreen  Colorectal cancer: colonoscopy up to date   Prostate cancer: PSA ordered today, denies urinary weakness, hesitancy, frequency, nocturia  Aspirin: not indicated ECG:  Not indicated  Vaccinations: up to date  Advanced Care Planning: A voluntary discussion about advance care planning including the explanation and discussion of advance directives.  Discussed health care proxy and Living will, and the patient DOES NOT have a living will at present time. If patient does have living will, I have requested they bring this to the clinic to  be scanned in to their chart.  Patient Active Problem List   Diagnosis Date Noted  . Sinus tachycardia 08/23/2017  . Anemia 08/23/2017  . Essential hypertension 12/29/2016  . Arthritis of left ankle 11/17/2016    Past Surgical History:  Procedure Laterality Date  . ANKLE FRACTURE SURGERY    . INSERTION OF MESH N/A 09/26/2016   Procedure: INSERTION OF MESH;  Surgeon: Greer Pickerel, MD;  Location: [facility];  Service: General;  Laterality: N/A;  . UMBILICAL HERNIA REPAIR N/A 09/26/2016   Procedure: LAPAROSCOPIC ASSISTED UMBILICAL HERNIA REPAIR WITH MESH;  Surgeon: Greer Pickerel, MD;  Location: [facility];  Service: General;  Laterality: N/A;    Family History  Problem Relation Age of Onset  . Hypertension Mother   . Diabetes Mother   . Hypertension Sister   . Hypertension Maternal Grandmother   . Hypertension Sister     Social History   Socioeconomic History  . Marital status: Married    Spouse name: Not on file  . Number of children: 0  . Years of education: 33  . Highest education level: Not on file  Occupational History  . Occupation: Unemployed  Social Needs  . Financial resource strain: Not on file  . Food insecurity:    Worry: Not on file    Inability: Not on file  . Transportation needs:    Medical: Not on file    Non-medical: Not on file  Tobacco Use  . Smoking  status: Never Smoker  . Smokeless tobacco: Never Used  Substance and Sexual Activity  . Alcohol use: No  . Drug use: No  . Sexual activity: Not on file  Lifestyle  . Physical activity:    Days per week: Not on file    Minutes per session: Not on file  . Stress: Not on file  Relationships  . Social connections:    Talks on phone: Not on file    Gets together: Not on file    Attends religious service: Not on file    Active member of club or organization: Not on file    Attends meetings of clubs or organizations: Not on file    Relationship status: Not on file  . Intimate partner violence:    Fear of  current or ex partner: Not on file    Emotionally abused: Not on file    Physically abused: Not on file    Forced sexual activity: Not on file  Other Topics Concern  . Not on file  Social History Narrative   Fun/Hobby - Travel      Current Outpatient Medications:  .  amLODipine (NORVASC) 5 MG tablet, Take 1 tablet (5 mg total) by mouth daily., Disp: 90 tablet, Rfl: 1 .  Cholecalciferol (VITAMIN D3) 2000 units capsule, Take 1 capsule (2,000 Units total) by mouth daily., Disp: 100 capsule, Rfl: 3 .  metoprolol succinate (TOPROL-XL) 25 MG 24 hr tablet, Take 1 tablet (25 mg total) by mouth daily., Disp: 90 tablet, Rfl: 1  No Known Allergies   ROS  Constitutional: Negative for fever or weight change.  Respiratory: Negative for cough and shortness of breath.   Cardiovascular: Negative for chest pain or palpitations.  Gastrointestinal: Negative for abdominal pain, no bowel changes.  Musculoskeletal: Negative for gait problem or falls.  Skin: Negative for rash.  Neurological: Negative for dizziness or headache.  No other specific complaints in a complete review of systems (except as listed in HPI above).   Objective  Vitals:   03/16/18 0805  BP: 122/78  Pulse: 65  Resp: 16  Temp: 98.5 F (36.9 C)  TempSrc: Oral  SpO2: 96%  Weight: 223 lb (101.2 kg)  Height: 5\' 5"  (1.651 m)    Body mass index is 37.11 kg/m.  Physical Exam VItal signs reviewed. Constitutional: Patient appears well-developed and well-nourished. No distress.  HENT: Head: Normocephalic and atraumatic. Ears: B TMs ok, no erythema or effusion; Nose: Nose normal. Mouth/Throat: Oropharynx is clear and moist. No oropharyngeal exudate.  Eyes: Conjunctivae and EOM are normal. Pupils are equal, round, and reactive to light. No scleral icterus.  Neck: Normal range of motion. Neck supple.   Cardiovascular: Normal rate, regular rhythm and normal heart sounds.  No murmur heard. No BLE edema. Pulmonary/Chest: Effort  normal and breath sounds normal. No respiratory distress. Abdominal: Soft. Bowel sounds are normal, no distension. There is no tenderness. no masses Musculoskeletal: No gross deformities Neurological: he is alert and oriented to person, place, and time. No cranial nerve deficit. Coordination, balance, strength, speech and gait are normal.  Skin: Skin is warm and dry. No rash noted. No erythema.  Psychiatric: Patient has a normal mood and affect. behavior is normal. Judgment and thought content normal.   Assessment & Plan RTC in 1 year for CPE Urinalysis, CBC w diff, hepatic function panel, BMET, TSH, B12, vitamin D done 08/23/17- labs WNL aside from low vitamin D level and elevated glucose

## 2018-03-16 NOTE — Assessment & Plan Note (Signed)
-  Prostate cancer screening and PSA options (with potential risks and benefits of testing vs not testing) were discussed along with recent recs/guidelines. -USPSTF grade A and B recommendations reviewed with patient; age-appropriate recommendations, preventive care, screening tests, etc discussed and encouraged; healthy living encouraged; see AVS for patient education given to patient -Discussed importance of regular physical activity, eat 6 servings of fruit/vegetables daily and drink plenty of water and avoid sweet beverages.  -Follow up and care instructions discussed and provided in AVS.   -Reviewed Health Maintenance:  Encounter for hepatitis C virus screening test for high risk patient- Hepatitis C antibody; Future Screening for HIV (human immunodeficiency virus)- HIV antibody; Future  Screening for cholesterol level- Lipid panel; Future- he is not fasting but would prefer to have labs done today  Screening for prostate cancer- PSA; Future  Elevated glucose- Hemoglobin A1c; Future

## 2018-03-16 NOTE — Assessment & Plan Note (Signed)
Now Maintained on vitamin D 2000 IU daily Update labs F/U with further recommendations pending lab results - VITAMIN D 25 Hydroxy (Vit-D Deficiency, Fractures); Future

## 2018-03-17 LAB — HIV ANTIBODY (ROUTINE TESTING W REFLEX): HIV: NONREACTIVE

## 2018-03-17 LAB — HEPATITIS C ANTIBODY
Hepatitis C Ab: NONREACTIVE
SIGNAL TO CUT-OFF: 0.01 (ref <1.00)

## 2018-03-19 NOTE — Telephone Encounter (Signed)
Results sent in result note

## 2018-03-19 NOTE — Telephone Encounter (Signed)
Pt following up on lab results. Pt would like a call back as soon as possible.

## 2018-03-23 ENCOUNTER — Other Ambulatory Visit: Payer: Self-pay | Admitting: Nurse Practitioner

## 2018-03-23 ENCOUNTER — Other Ambulatory Visit: Payer: Self-pay | Admitting: Emergency Medicine

## 2018-03-23 DIAGNOSIS — E559 Vitamin D deficiency, unspecified: Secondary | ICD-10-CM

## 2018-03-23 DIAGNOSIS — E785 Hyperlipidemia, unspecified: Secondary | ICD-10-CM

## 2018-03-23 DIAGNOSIS — E119 Type 2 diabetes mellitus without complications: Secondary | ICD-10-CM

## 2018-03-23 MED ORDER — VITAMIN D (ERGOCALCIFEROL) 1.25 MG (50000 UNIT) PO CAPS: 50000.0000 [IU] | ORAL_CAPSULE | ORAL | 0 refills | Status: DC

## 2018-03-23 NOTE — Progress Notes (Signed)
orders

## 2018-04-03 DIAGNOSIS — R7303 Prediabetes: Secondary | ICD-10-CM | POA: Insufficient documentation

## 2018-04-20 DIAGNOSIS — M19172 Post-traumatic osteoarthritis, left ankle and foot: Secondary | ICD-10-CM | POA: Diagnosis not present

## 2018-04-20 DIAGNOSIS — G8918 Other acute postprocedural pain: Secondary | ICD-10-CM | POA: Diagnosis not present

## 2018-04-21 DIAGNOSIS — Z4789 Encounter for other orthopedic aftercare: Secondary | ICD-10-CM | POA: Diagnosis not present

## 2018-04-21 DIAGNOSIS — Z981 Arthrodesis status: Secondary | ICD-10-CM | POA: Diagnosis not present

## 2018-04-21 DIAGNOSIS — Z79899 Other long term (current) drug therapy: Secondary | ICD-10-CM | POA: Diagnosis not present

## 2018-05-08 ENCOUNTER — Ambulatory Visit: Payer: 59 | Admitting: Registered"

## 2018-05-09 DIAGNOSIS — Z981 Arthrodesis status: Secondary | ICD-10-CM | POA: Diagnosis not present

## 2018-05-09 DIAGNOSIS — Z4789 Encounter for other orthopedic aftercare: Secondary | ICD-10-CM | POA: Diagnosis not present

## 2018-05-24 DIAGNOSIS — M19172 Post-traumatic osteoarthritis, left ankle and foot: Secondary | ICD-10-CM | POA: Diagnosis not present

## 2018-05-31 DIAGNOSIS — M19072 Primary osteoarthritis, left ankle and foot: Secondary | ICD-10-CM | POA: Diagnosis not present

## 2018-05-31 DIAGNOSIS — Z4789 Encounter for other orthopedic aftercare: Secondary | ICD-10-CM | POA: Diagnosis not present

## 2018-05-31 DIAGNOSIS — M19172 Post-traumatic osteoarthritis, left ankle and foot: Secondary | ICD-10-CM | POA: Diagnosis not present

## 2018-05-31 DIAGNOSIS — Z981 Arthrodesis status: Secondary | ICD-10-CM | POA: Diagnosis not present

## 2018-06-18 ENCOUNTER — Encounter: Payer: 59 | Attending: Nurse Practitioner | Admitting: Registered"

## 2018-06-18 ENCOUNTER — Encounter: Payer: Self-pay | Admitting: Registered"

## 2018-06-18 DIAGNOSIS — E785 Hyperlipidemia, unspecified: Secondary | ICD-10-CM | POA: Insufficient documentation

## 2018-06-18 DIAGNOSIS — Z713 Dietary counseling and surveillance: Secondary | ICD-10-CM | POA: Insufficient documentation

## 2018-06-18 DIAGNOSIS — E119 Type 2 diabetes mellitus without complications: Secondary | ICD-10-CM | POA: Diagnosis not present

## 2018-06-18 NOTE — Progress Notes (Signed)
Diabetes Self-Management Education  Visit Type: First/Initial  Appt. Start Time: 1000 Appt. End Time: 1115  06/18/2018  Mr. Brandon Reyes, identified by name and date of birth, is a 57 y.o. male with a diagnosis of Diabetes: Type 2.   ASSESSMENT Patient states he is very motivated to take care of his health. Pt states since he has not worked since he broke his ankle 3 yrs ago and has had arthritis set it. Pt states before his recent surgery on foot he was going to the gym 2-3x week. Pt states he his appetite has reduced since having surgery, may have some snacks during the day and then dinner, whatever his wife brings home.   Pt states not working has increased his stress due to financial concerns, but states he will be getting checks soon for his foot injury and is feeling better about getting back on top of his bills.  Sleep: plenty. 10:30-3 am 4:30-5 news. Sleep 7 am  Patient states he is interested testing his blood sugar so it can help him keep it under control.   OneTouch Verio Flex Lot #: [id number] X Exp: 03/01/19 BG: 116 mg/dL  Diabetes Self-Management Education - 06/18/18 1014      Visit Information   Visit Type  First/Initial      Initial Visit   Diabetes Type  Type 2    Are you currently following a meal plan?  No    Are you taking your medications as prescribed?  Not on Medications    Date Diagnosed  2019      Health Coping   How would you rate your overall health?  Good      Psychosocial Assessment   How often do you need to have someone help you when you read instructions, pamphlets, or other written materials from your doctor or pharmacy?  1 - Never    What is the last grade level you completed in school?  12      Complications   Last HgB A1C per patient/outside source  6.6 %    How often do you check your blood sugar?  0 times/day (not testing)    Have you had a dilated eye exam in the past 12 months?  Yes    Have you had a dental exam in the past 12 months?   Yes    Are you checking your feet?  No      Dietary Intake   Breakfast  none OR banana     Snack (morning)  peanuts    Dinner  baked chicken, coleslaw OR fried seafood, coleslaw    Snack (evening)  occassionally sugar-free wafers, 4 chocolate raisins    Beverage(s)  water, 10-12 oz apple juice around meal time      Exercise   Exercise Type  ADL's    How many days per week to you exercise?  0    How many minutes per day do you exercise?  0    Total minutes per week of exercise  0      Patient Education   Previous Diabetes Education  No    Disease state   Definition of diabetes, type 1 and 2, and the diagnosis of diabetes    Nutrition management   Role of diet in the treatment of diabetes and the relationship between the three main macronutrients and blood glucose level    Physical activity and exercise   Role of exercise on diabetes management, blood pressure control and cardiac  health.    Monitoring  Purpose and frequency of SMBG.;Taught/evaluated SMBG meter.    Psychosocial adjustment  Role of stress on diabetes      Individualized Goals (developed by patient)   Nutrition  General guidelines for healthy choices and portions discussed    Monitoring   test my blood glucose as discussed      Outcomes   Expected Outcomes  Demonstrated interest in learning. Expect positive outcomes    Future DMSE  PRN    Program Status  Completed      Individualized Plan for Diabetes Self-Management Training:   Learning Objective:  Patient will have a greater understanding of diabetes self-management. Patient education plan is to attend individual and/or group sessions per assessed needs and concerns.  Patient Instructions  Continue to take the vitamin D supplement Consider getting in more exercise as you are able Aim to eat balanced meals & snacks, including protein when eating fruit and other carbs. If you want to continue checking your blood sugar call your insurance company to ask what  meter they prefer and will cover the supplies for.  Expected Outcomes:  Demonstrated interest in learning. Expect positive outcomes  Education material provided: A1C conversion sheet and My Plate  If problems or questions, patient to contact team via:  Phone  Future DSME appointment: PRN

## 2018-06-18 NOTE — Patient Instructions (Signed)
Continue to take the vitamin D supplement Consider getting in more exercise as you are able Aim to eat balanced meals & snacks, including protein when eating fruit and other carbs. If you want to continue checking your blood sugar call your insurance company to ask what meter they prefer and will cover the supplies for.

## 2018-07-12 DIAGNOSIS — M19172 Post-traumatic osteoarthritis, left ankle and foot: Secondary | ICD-10-CM | POA: Diagnosis not present

## 2018-07-12 DIAGNOSIS — Z981 Arthrodesis status: Secondary | ICD-10-CM | POA: Diagnosis not present

## 2018-07-12 DIAGNOSIS — M7989 Other specified soft tissue disorders: Secondary | ICD-10-CM | POA: Diagnosis not present

## 2018-07-12 DIAGNOSIS — Z4789 Encounter for other orthopedic aftercare: Secondary | ICD-10-CM | POA: Diagnosis not present

## 2018-08-31 ENCOUNTER — Telehealth: Payer: Self-pay | Admitting: Nurse Practitioner

## 2018-08-31 ENCOUNTER — Telehealth: Payer: Self-pay | Admitting: Family Medicine

## 2018-08-31 NOTE — Telephone Encounter (Signed)
Good shoes with rigid bottom.  Brandon Reyes, [place] or New balance greater then [address] could be good as well

## 2018-08-31 NOTE — Telephone Encounter (Signed)
Placard form completed for Mr Knauff

## 2018-08-31 NOTE — Telephone Encounter (Signed)
Form is upfront for p/u. Pt informed.

## 2018-08-31 NOTE — Telephone Encounter (Signed)
Discussed with pt

## 2018-08-31 NOTE — Telephone Encounter (Signed)
Patient states Dr. Tamala Julian had suggested a certain shoe for him to wear.  Patient states he has forgotten.  He is requesting a call back in regard.

## 2018-08-31 NOTE — Telephone Encounter (Signed)
Patient states that [place] completed a parking placard 6 months ago for him.  He states he has lost the form.  I did not see a copy scanned into media.  He is requesting another parking placard.

## 2018-09-27 ENCOUNTER — Other Ambulatory Visit: Payer: Self-pay | Admitting: Nurse Practitioner

## 2018-09-27 DIAGNOSIS — I10 Essential (primary) hypertension: Secondary | ICD-10-CM

## 2018-10-25 DIAGNOSIS — M19072 Primary osteoarthritis, left ankle and foot: Secondary | ICD-10-CM | POA: Insufficient documentation

## 2018-10-25 DIAGNOSIS — M19079 Primary osteoarthritis, unspecified ankle and foot: Secondary | ICD-10-CM | POA: Diagnosis not present

## 2018-10-25 DIAGNOSIS — M19172 Post-traumatic osteoarthritis, left ankle and foot: Secondary | ICD-10-CM | POA: Diagnosis not present

## 2018-10-25 DIAGNOSIS — T85848D Pain due to other internal prosthetic devices, implants and grafts, subsequent encounter: Secondary | ICD-10-CM | POA: Diagnosis not present

## 2018-10-25 DIAGNOSIS — Z96698 Presence of other orthopedic joint implants: Secondary | ICD-10-CM | POA: Diagnosis not present

## 2018-10-25 DIAGNOSIS — T8484XD Pain due to internal orthopedic prosthetic devices, implants and grafts, subsequent encounter: Secondary | ICD-10-CM | POA: Diagnosis not present

## 2019-02-20 ENCOUNTER — Ambulatory Visit (INDEPENDENT_AMBULATORY_CARE_PROVIDER_SITE_OTHER): Payer: 59 | Admitting: Family

## 2019-02-20 ENCOUNTER — Other Ambulatory Visit: Payer: Self-pay

## 2019-02-20 ENCOUNTER — Other Ambulatory Visit (INDEPENDENT_AMBULATORY_CARE_PROVIDER_SITE_OTHER): Payer: 59

## 2019-02-20 ENCOUNTER — Encounter: Payer: Self-pay | Admitting: Family

## 2019-02-20 VITALS — BP 124/80 | HR 68 | Temp 97.8°F | Ht 65.0 in | Wt 222.0 lb

## 2019-02-20 DIAGNOSIS — E119 Type 2 diabetes mellitus without complications: Secondary | ICD-10-CM

## 2019-02-20 DIAGNOSIS — I10 Essential (primary) hypertension: Secondary | ICD-10-CM

## 2019-02-20 DIAGNOSIS — Z125 Encounter for screening for malignant neoplasm of prostate: Secondary | ICD-10-CM | POA: Diagnosis not present

## 2019-02-20 DIAGNOSIS — E559 Vitamin D deficiency, unspecified: Secondary | ICD-10-CM

## 2019-02-20 LAB — COMPREHENSIVE METABOLIC PANEL
ALT: 14 U/L (ref 0–53)
AST: 15 U/L (ref 0–37)
Albumin: 3.8 g/dL (ref 3.5–5.2)
Alkaline Phosphatase: 52 U/L (ref 39–117)
BUN: 11 mg/dL (ref 6–23)
CO2: 28 mEq/L (ref 19–32)
Calcium: 9 mg/dL (ref 8.4–10.5)
Chloride: 106 mEq/L (ref 96–112)
Creatinine, Ser: 0.95 mg/dL (ref 0.40–1.50)
GFR: 98.83 mL/min (ref >60.00)
Glucose, Bld: 93 mg/dL (ref 70–99)
Potassium: 3.7 mEq/L (ref 3.5–5.1)
Sodium: 140 mEq/L (ref 135–145)
Total Bilirubin: 0.2 mg/dL (ref 0.2–1.2)
Total Protein: 5.8 g/dL — ABNORMAL LOW (ref 6.0–8.3)

## 2019-02-20 LAB — LIPID PANEL
Cholesterol: 186 mg/dL (ref 0–200)
HDL: 40.6 mg/dL (ref >39.00)
LDL Cholesterol: 122 mg/dL — ABNORMAL HIGH (ref 0–99)
NonHDL: 145.57
Total CHOL/HDL Ratio: 5
Triglycerides: 119 mg/dL (ref 0.0–149.0)
VLDL: 23.8 mg/dL (ref 0.0–40.0)

## 2019-02-20 LAB — CBC WITH DIFFERENTIAL/PLATELET
Basophils Absolute: 0.1 10*3/uL (ref 0.0–0.1)
Basophils Relative: 1.1 % (ref 0.0–3.0)
Eosinophils Absolute: 0.3 10*3/uL (ref 0.0–0.7)
Eosinophils Relative: 2.5 % (ref 0.0–5.0)
HCT: 41.6 % (ref 39.0–52.0)
Hemoglobin: 13.5 g/dL (ref 13.0–17.0)
Lymphocytes Relative: 28.2 % (ref 12.0–46.0)
Lymphs Abs: 2.8 10*3/uL (ref 0.7–4.0)
MCHC: 32.3 g/dL (ref 30.0–36.0)
MCV: 87.7 fl (ref 78.0–100.0)
Monocytes Absolute: 0.7 10*3/uL (ref 0.1–1.0)
Monocytes Relative: 7.2 % (ref 3.0–12.0)
Neutro Abs: 6.1 10*3/uL (ref 1.4–7.7)
Neutrophils Relative %: 61 % (ref 43.0–77.0)
Platelets: 209 10*3/uL (ref 150.0–400.0)
RBC: 4.75 Mil/uL (ref 4.22–5.81)
RDW: 13.9 % (ref 11.5–15.5)
WBC: 10 10*3/uL (ref 4.0–10.5)

## 2019-02-20 LAB — PSA: PSA: 0.48 ng/mL (ref 0.10–4.00)

## 2019-02-20 LAB — VITAMIN D 25 HYDROXY (VIT D DEFICIENCY, FRACTURES): VITD: 13.02 ng/mL — ABNORMAL LOW (ref 30.00–100.00)

## 2019-02-20 LAB — HEMOGLOBIN A1C: Hgb A1c MFr Bld: 6.4 % (ref 4.6–6.5)

## 2019-02-20 MED ORDER — AMLODIPINE BESYLATE 5 MG PO TABS: 5.0000 mg | ORAL_TABLET | Freq: Every day | ORAL | 1 refills | Status: DC

## 2019-02-20 MED ORDER — METOPROLOL SUCCINATE ER 25 MG PO TB24: 25.0000 mg | ORAL_TABLET | Freq: Every day | ORAL | 1 refills | Status: DC

## 2019-02-20 NOTE — Progress Notes (Signed)
Brandon Reyes is a 58 y.o. male with the following history as recorded in EpicCare:  Patient Active Problem List   Diagnosis Date Noted  . Newly diagnosed diabetes ([place]) 06/18/2018  . Vitamin D deficiency 03/16/2018  . Encounter for general adult medical examination with abnormal findings 03/16/2018  . Sinus tachycardia 08/23/2017  . Anemia 08/23/2017  . Essential hypertension 12/29/2016  . Arthritis of left ankle 11/17/2016    Current Outpatient Medications  Medication Sig Dispense Refill  . amLODipine (NORVASC) 5 MG tablet TAKE 1 TABLET BY MOUTH ONCE DAILY 90 tablet 1  . metoprolol succinate (TOPROL-XL) 25 MG 24 hr tablet TAKE 1 TABLET BY MOUTH ONCE DAILY 90 tablet 1  . Omega-3 Fatty Acids (FISH OIL PO) Take by mouth.     No current facility-administered medications for this visit.     Allergies: Patient has no known allergies.  Past Medical History:  Diagnosis Date  . Arthritis   . Hypertension     Past Surgical History:  Procedure Laterality Date  . ANKLE FRACTURE SURGERY    . INSERTION OF MESH N/A 09/26/2016   Procedure: INSERTION OF MESH;  Surgeon: Greer Pickerel, MD;  Location: [facility];  Service: General;  Laterality: N/A;  . UMBILICAL HERNIA REPAIR N/A 09/26/2016   Procedure: LAPAROSCOPIC ASSISTED UMBILICAL HERNIA REPAIR WITH MESH;  Surgeon: Greer Pickerel, MD;  Location: [facility];  Service: General;  Laterality: N/A;    Family History  Problem Relation Age of Onset  . Hypertension Mother   . Diabetes Mother   . Hypertension Sister   . Hypertension Maternal Grandmother   . Hypertension Sister     Social History   Tobacco Use  . Smoking status: Never Smoker  . Smokeless tobacco: Never Used  Substance Use Topics  . Alcohol use: No    Subjective:   Presents for follow-up on chronic care needs including:  1) Hypertension- stable on Toprol XL and Amlodipine; Denies any chest pain, shortness of breath, blurred vision or headache. 2) Vitamin D deficiency- not taking any  type of supplement; 3) Diet controlled Type 2 Diabetes- does not check his sugar regularly; states he actually did not realized he had diabetes; was referred to neurology last year- unclear if he actually went; 4) Chronic ankle pain/ O/A; needs to get updated handicapped placard; does use a cane and notes this will be a chronic need; wonders about permanent handicapped placard.    Objective:  Vitals:   02/20/19 1219  BP: 124/80  Pulse: 68  Temp: 97.8 F (36.6 C)  TempSrc: Oral  SpO2: 96%  Weight: 222 lb (100.7 kg)  Height: _0  (1.651 m)    General: Well developed, well nourished, in no acute distress  Skin : Warm and dry.  Head: Normocephalic and atraumatic  Lungs: Respirations unlabored; clear to auscultation bilaterally without wheeze, rales, rhonchi  CVS exam: normal rate and regular rhythm.  Musculoskeletal: No deformities; no active joint inflammation  Extremities: No edema, cyanosis, clubbing  Vessels: Symmetric bilaterally  Neurologic: Alert and oriented; speech intact; face symmetrical; moves all extremities well; CNII-XII intact without focal deficit; uses cane for stability  Assessment:  1. Essential hypertension   2. Diet-controlled type 2 diabetes mellitus ([place])   3. Vitamin D deficiency   4. Prostate cancer screening     Plan:  1. Stable; refills updated; check CBC, CMP; 2. Check Hgba1c today; reminded patient he needs to be seen every 6 months; 3. Check Vitamin D level;  4. Check  PSA;   Handicapped placard updated x 5 years;   No follow-ups on file.  Orders Placed This Encounter  Procedures  . CBC w/Diff    Standing Status:   Future    Standing Expiration Date:   02/20/2020  . Comp Met (CMET)    Standing Status:   Future    Standing Expiration Date:   02/20/2020  . Lipid panel    Standing Status:   Future    Standing Expiration Date:   02/20/2020  . HgB A1c    Standing Status:   Future    Standing Expiration Date:   02/20/2020  . Vitamin D (25  hydroxy)    Standing Status:   Future    Standing Expiration Date:   02/20/2020  . PSA    Standing Status:   Future    Standing Expiration Date:   02/20/2020    Requested Prescriptions    No prescriptions requested or ordered in this encounter

## 2019-02-21 ENCOUNTER — Other Ambulatory Visit: Payer: Self-pay | Admitting: Family

## 2019-02-21 MED ORDER — ATORVASTATIN CALCIUM 20 MG PO TABS: 20.0000 mg | ORAL_TABLET | Freq: Every day | ORAL | 1 refills | Status: DC

## 2019-02-21 MED ORDER — VITAMIN D (ERGOCALCIFEROL) 1.25 MG (50000 UNIT) PO CAPS: 50000.0000 [IU] | ORAL_CAPSULE | ORAL | 0 refills | Status: AC

## 2019-05-20 ENCOUNTER — Other Ambulatory Visit (INDEPENDENT_AMBULATORY_CARE_PROVIDER_SITE_OTHER): Payer: 59

## 2019-05-20 ENCOUNTER — Ambulatory Visit (INDEPENDENT_AMBULATORY_CARE_PROVIDER_SITE_OTHER): Payer: 59 | Admitting: Family

## 2019-05-20 ENCOUNTER — Encounter: Payer: Self-pay | Admitting: Family

## 2019-05-20 ENCOUNTER — Other Ambulatory Visit: Payer: Self-pay

## 2019-05-20 VITALS — BP 118/78 | HR 75 | Temp 98.1°F | Ht 65.0 in | Wt 227.0 lb

## 2019-05-20 DIAGNOSIS — R7303 Prediabetes: Secondary | ICD-10-CM

## 2019-05-20 DIAGNOSIS — E782 Mixed hyperlipidemia: Secondary | ICD-10-CM

## 2019-05-20 DIAGNOSIS — E559 Vitamin D deficiency, unspecified: Secondary | ICD-10-CM

## 2019-05-20 DIAGNOSIS — I10 Essential (primary) hypertension: Secondary | ICD-10-CM | POA: Diagnosis not present

## 2019-05-20 LAB — HEMOGLOBIN A1C: Hgb A1c MFr Bld: 6.6 % — ABNORMAL HIGH (ref 4.6–6.5)

## 2019-05-20 LAB — LIPID PANEL
Cholesterol: 177 mg/dL (ref 0–200)
HDL: 33.4 mg/dL — ABNORMAL LOW (ref >39.00)
NonHDL: 143.92
Total CHOL/HDL Ratio: 5
Triglycerides: 265 mg/dL — ABNORMAL HIGH (ref 0.0–149.0)
VLDL: 53 mg/dL — ABNORMAL HIGH (ref 0.0–40.0)

## 2019-05-20 LAB — VITAMIN D 25 HYDROXY (VIT D DEFICIENCY, FRACTURES): VITD: 14.38 ng/mL — ABNORMAL LOW (ref 30.00–100.00)

## 2019-05-20 LAB — LDL CHOLESTEROL, DIRECT: Direct LDL: 117 mg/dL

## 2019-05-20 MED ORDER — METOPROLOL SUCCINATE ER 25 MG PO TB24: 25.0000 mg | ORAL_TABLET | Freq: Every day | ORAL | 3 refills | Status: DC

## 2019-05-20 MED ORDER — AMLODIPINE BESYLATE 5 MG PO TABS: 5.0000 mg | ORAL_TABLET | Freq: Every day | ORAL | 3 refills | Status: DC

## 2019-05-20 NOTE — Progress Notes (Signed)
Brandon Reyes is a 58 y.o. male with the following history as recorded in EpicCare:  Patient Active Problem List   Diagnosis Date Noted  . Newly diagnosed diabetes ([place]) 06/18/2018  . Vitamin D deficiency 03/16/2018  . Encounter for general adult medical examination with abnormal findings 03/16/2018  . Sinus tachycardia 08/23/2017  . Anemia 08/23/2017  . Essential hypertension 12/29/2016  . Arthritis of left ankle 11/17/2016    Current Outpatient Medications  Medication Sig Dispense Refill  . amLODipine (NORVASC) 5 MG tablet Take 1 tablet (5 mg total) by mouth daily. 90 tablet 3  . atorvastatin (LIPITOR) 20 MG tablet Take 1 tablet (20 mg total) by mouth daily. 90 tablet 1  . metoprolol succinate (TOPROL-XL) 25 MG 24 hr tablet Take 1 tablet (25 mg total) by mouth daily. 90 tablet 3  . Omega-3 Fatty Acids (FISH OIL PO) Take by mouth.     No current facility-administered medications for this visit.     Allergies: Patient has no known allergies.  Past Medical History:  Diagnosis Date  . Arthritis   . Hypertension     Past Surgical History:  Procedure Laterality Date  . ANKLE FRACTURE SURGERY    . INSERTION OF MESH N/A 09/26/2016   Procedure: INSERTION OF MESH;  Surgeon: Greer Pickerel, MD;  Location: [facility];  Service: General;  Laterality: N/A;  . UMBILICAL HERNIA REPAIR N/A 09/26/2016   Procedure: LAPAROSCOPIC ASSISTED UMBILICAL HERNIA REPAIR WITH MESH;  Surgeon: Greer Pickerel, MD;  Location: [facility];  Service: General;  Laterality: N/A;    Family History  Problem Relation Age of Onset  . Hypertension Mother   . Diabetes Mother   . Hypertension Sister   . Hypertension Maternal Grandmother   . Hypertension Sister     Social History   Tobacco Use  . Smoking status: Never Smoker  . Smokeless tobacco: Never Used  Substance Use Topics  . Alcohol use: No    Subjective:  Patient presents for a 3 month follow-up on hyperlipidemia/ pre-diabetes/ Vitamin D deficiency; no aches/ pain  on Lipitor; in baseline state of health today with no concerns; Denies any chest pain, shortness of breath, blurred vision or headache. Notes that he would prefer to only come in once a year if possible.     Objective:  Vitals:   05/20/19 0834  BP: 118/78  Pulse: 75  Temp: 98.1 F (36.7 C)  TempSrc: Oral  SpO2: 99%  Weight: 227 lb (103 kg)  Height: 5\' 5"  (1.651 m)    General: Well developed, well nourished, in no acute distress  Skin : Warm and dry.  Head: Normocephalic and atraumatic  Lungs: Respirations unlabored; clear to auscultation bilaterally without wheeze, rales, rhonchi  CVS exam: normal rate and regular rhythm.  Neurologic: Alert and oriented; speech intact; face symmetrical; moves all extremities well; CNII-XII intact without focal deficit   Assessment:  1. Pre-diabetes   2. Vitamin D deficiency   3. Mixed hyperlipidemia   4. Essential hypertension     Plan:  1. Update Hgba1c; encouraged weight loss/ continue to work on limiting intake of refined sugars; 2. Update Vitamin D level; 3. Check lipid panel today; may need to increase to 40 mg daily; 4. Stable; refills updated;  Influenza immunization was not given due to patient refusal.  No follow-ups on file.  Orders Placed This Encounter  Procedures  . Lipid panel    Standing Status:   Future    Standing Expiration Date:  05/19/2020  . Vitamin D (25 hydroxy)    Standing Status:   Future    Standing Expiration Date:   05/19/2020  . HgB A1c    Standing Status:   Future    Standing Expiration Date:   05/19/2020    Requested Prescriptions   Signed Prescriptions Disp Refills  . metoprolol succinate (TOPROL-XL) 25 MG 24 hr tablet 90 tablet 3    Sig: Take 1 tablet (25 mg total) by mouth daily.  Marland Kitchen amLODipine (NORVASC) 5 MG tablet 90 tablet 3    Sig: Take 1 tablet (5 mg total) by mouth daily.

## 2019-05-22 ENCOUNTER — Other Ambulatory Visit: Payer: Self-pay | Admitting: Family

## 2019-05-22 MED ORDER — ATORVASTATIN CALCIUM 40 MG PO TABS: 40.0000 mg | ORAL_TABLET | Freq: Every day | ORAL | 1 refills | Status: DC

## 2019-05-22 MED ORDER — VITAMIN D (ERGOCALCIFEROL) 1.25 MG (50000 UNIT) PO CAPS: 50000.0000 [IU] | ORAL_CAPSULE | ORAL | 0 refills | Status: AC

## 2019-09-30 ENCOUNTER — Ambulatory Visit: Payer: 59 | Attending: Internal Medicine

## 2019-09-30 DIAGNOSIS — Z23 Encounter for immunization: Secondary | ICD-10-CM | POA: Insufficient documentation

## 2019-09-30 NOTE — Progress Notes (Signed)
   Covid-19 Vaccination Clinic  Name:  Brandon Reyes    MRN: [id number] DOB: 02-12-1961  09/30/2019  Brandon Reyes was observed post Covid-19 immunization for 15 minutes without incidence. He was provided with Vaccine Information Sheet and instruction to access the V-Safe system.   Brandon Reyes was instructed to call 911 with any severe reactions post vaccine: Marland Kitchen Difficulty breathing  . Swelling of your face and throat  . A fast heartbeat  . A bad rash all over your body  . Dizziness and weakness    Immunizations Administered    Name Date Dose VIS Date Route   Pfizer COVID-19 Vaccine 09/30/2019  1:35 PM 0.3 mL 07/12/2019 Intramuscular   Manufacturer: [place]   Lot: [id number]   [place]: [id number]

## 2019-10-23 ENCOUNTER — Ambulatory Visit: Payer: 59 | Attending: Internal Medicine

## 2019-10-23 DIAGNOSIS — Z23 Encounter for immunization: Secondary | ICD-10-CM

## 2019-10-23 NOTE — Progress Notes (Signed)
   Covid-19 Vaccination Clinic  Name:  Kyson Cichowski    MRN: [id number] DOB: [birth year]  10/23/2019  Mr. Sunderhaus was observed post Covid-19 immunization for 15 minutes without incident. He was provided with Vaccine Information Sheet and instruction to access the V-Safe system.   Mr. Fredrick was instructed to call 911 with any severe reactions post vaccine: Marland Kitchen Difficulty breathing  . Swelling of face and throat  . A fast heartbeat  . A bad rash all over body  . Dizziness and weakness   Immunizations Administered    Name Date Dose VIS Date Route   Pfizer COVID-19 Vaccine 10/23/2019 10:34 AM 0.3 mL 07/12/2019 Intramuscular   Manufacturer: [place]   Lot: [id number]   [place]: [id number]

## 2019-11-05 ENCOUNTER — Telehealth: Payer: Self-pay

## 2019-11-05 NOTE — Telephone Encounter (Signed)
Spoke with patient today. Was inquiring about a 90 day supply of Viagra once he gets his new insurance. He starts his new [employer] on 11/30/19. Would like to get temporary supply for 30 days under his old insurance and then get 90 supply beginning of May once insurance kicks in as he stated they would cover 90 supply for free. Patient assured we would discuss during his next office visit on 4/19.

## 2019-11-05 NOTE — Telephone Encounter (Signed)
New message    The patient did not want to disclose any information only wants to speak with Mickel Baas states he needs to discuss something with her.

## 2019-11-08 ENCOUNTER — Other Ambulatory Visit: Payer: Self-pay | Admitting: Family

## 2019-11-08 MED ORDER — ATORVASTATIN CALCIUM 40 MG PO TABS: 40.0000 mg | ORAL_TABLET | Freq: Every day | ORAL | 0 refills | Status: DC

## 2019-11-18 ENCOUNTER — Encounter: Payer: Self-pay | Admitting: Family

## 2019-11-18 ENCOUNTER — Other Ambulatory Visit: Payer: Self-pay | Admitting: Family

## 2019-11-18 ENCOUNTER — Other Ambulatory Visit: Payer: Self-pay

## 2019-11-18 ENCOUNTER — Ambulatory Visit (INDEPENDENT_AMBULATORY_CARE_PROVIDER_SITE_OTHER): Payer: Medicare Other | Admitting: Family

## 2019-11-18 VITALS — BP 122/70 | HR 70 | Temp 98.5°F | Ht 65.0 in | Wt 224.0 lb

## 2019-11-18 DIAGNOSIS — R600 Localized edema: Secondary | ICD-10-CM | POA: Diagnosis not present

## 2019-11-18 DIAGNOSIS — E119 Type 2 diabetes mellitus without complications: Secondary | ICD-10-CM | POA: Diagnosis not present

## 2019-11-18 DIAGNOSIS — E559 Vitamin D deficiency, unspecified: Secondary | ICD-10-CM | POA: Diagnosis not present

## 2019-11-18 DIAGNOSIS — I10 Essential (primary) hypertension: Secondary | ICD-10-CM | POA: Diagnosis not present

## 2019-11-18 LAB — LIPID PANEL
Cholesterol: 115 mg/dL (ref 0–200)
HDL: 40.4 mg/dL (ref >39.00)
LDL Cholesterol: 64 mg/dL (ref 0–99)
NonHDL: 75.02
Total CHOL/HDL Ratio: 3
Triglycerides: 56 mg/dL (ref 0.0–149.0)
VLDL: 11.2 mg/dL (ref 0.0–40.0)

## 2019-11-18 LAB — HEMOGLOBIN A1C: Hgb A1c MFr Bld: 6.1 % (ref 4.6–6.5)

## 2019-11-18 LAB — COMPREHENSIVE METABOLIC PANEL
ALT: 24 U/L (ref 0–53)
AST: 22 U/L (ref 0–37)
Albumin: 3.4 g/dL — ABNORMAL LOW (ref 3.5–5.2)
Alkaline Phosphatase: 34 U/L — ABNORMAL LOW (ref 39–117)
BUN: 13 mg/dL (ref 6–23)
CO2: 28 mEq/L (ref 19–32)
Calcium: 8.3 mg/dL — ABNORMAL LOW (ref 8.4–10.5)
Chloride: 106 mEq/L (ref 96–112)
Creatinine, Ser: 0.82 mg/dL (ref 0.40–1.50)
GFR: 116.82 mL/min (ref >60.00)
Glucose, Bld: 89 mg/dL (ref 70–99)
Potassium: 4.3 mEq/L (ref 3.5–5.1)
Sodium: 138 mEq/L (ref 135–145)
Total Bilirubin: 0.4 mg/dL (ref 0.2–1.2)
Total Protein: 5.3 g/dL — ABNORMAL LOW (ref 6.0–8.3)

## 2019-11-18 LAB — VITAMIN D 25 HYDROXY (VIT D DEFICIENCY, FRACTURES): VITD: 14.83 ng/mL — ABNORMAL LOW (ref 30.00–100.00)

## 2019-11-18 MED ORDER — METOPROLOL SUCCINATE ER 25 MG PO TB24: 25.0000 mg | ORAL_TABLET | Freq: Every day | ORAL | 3 refills | Status: DC

## 2019-11-18 MED ORDER — ATORVASTATIN CALCIUM 40 MG PO TABS: 40.0000 mg | ORAL_TABLET | Freq: Every day | ORAL | 0 refills | Status: DC

## 2019-11-18 MED ORDER — SILDENAFIL CITRATE 100 MG PO TABS: 50.0000 mg | ORAL_TABLET | Freq: Every day | ORAL | 1 refills | Status: DC | PRN

## 2019-11-18 MED ORDER — AMLODIPINE BESYLATE 5 MG PO TABS: 5.0000 mg | ORAL_TABLET | Freq: Every day | ORAL | 3 refills | Status: DC

## 2019-11-18 MED ORDER — ATORVASTATIN CALCIUM 40 MG PO TABS: 40.0000 mg | ORAL_TABLET | Freq: Every day | ORAL | 3 refills | Status: DC

## 2019-11-18 MED ORDER — VITAMIN D (ERGOCALCIFEROL) 1.25 MG (50000 UNIT) PO CAPS: 50000.0000 [IU] | ORAL_CAPSULE | ORAL | 0 refills | Status: AC

## 2019-11-18 NOTE — Progress Notes (Signed)
Brandon Reyes is a 59 y.o. male with the following history as recorded in EpicCare:  Patient Active Problem List   Diagnosis Date Noted  . Newly diagnosed diabetes ([place]) 06/18/2018  . Vitamin D deficiency 03/16/2018  . Encounter for general adult medical examination with abnormal findings 03/16/2018  . Sinus tachycardia 08/23/2017  . Anemia 08/23/2017  . Essential hypertension 12/29/2016  . Arthritis of left ankle 11/17/2016    Current Outpatient Medications  Medication Sig Dispense Refill  . amLODipine (NORVASC) 5 MG tablet Take 1 tablet (5 mg total) by mouth daily. 90 tablet 3  . atorvastatin (LIPITOR) 40 MG tablet Take 1 tablet (40 mg total) by mouth daily. 30 tablet 0  . Cholecalciferol 1.25 MG (50000 UT) capsule Take by mouth.    . metoprolol succinate (TOPROL-XL) 25 MG 24 hr tablet Take 1 tablet (25 mg total) by mouth daily. 90 tablet 3  . Omega-3 Fatty Acids (FISH OIL PO) Take by mouth.    . sildenafil (VIAGRA) 100 MG tablet Take 0.5-1 tablets (50-100 mg total) by mouth daily as needed for erectile dysfunction. 10 tablet 1   No current facility-administered medications for this visit.    Allergies: Patient has no known allergies.  Past Medical History:  Diagnosis Date  . Arthritis   . Hypertension     Past Surgical History:  Procedure Laterality Date  . ANKLE FRACTURE SURGERY    . INSERTION OF MESH N/A 09/26/2016   Procedure: INSERTION OF MESH;  Surgeon: Greer Pickerel, MD;  Location: [facility];  Service: General;  Laterality: N/A;  . UMBILICAL HERNIA REPAIR N/A 09/26/2016   Procedure: LAPAROSCOPIC ASSISTED UMBILICAL HERNIA REPAIR WITH MESH;  Surgeon: Greer Pickerel, MD;  Location: [facility];  Service: General;  Laterality: N/A;    Family History  Problem Relation Age of Onset  . Hypertension Mother   . Diabetes Mother   . Hypertension Sister   . Hypertension Maternal Grandmother   . Hypertension Sister     Social History   Tobacco Use  . Smoking status: Never Smoker  .  Smokeless tobacco: Never Used  Substance Use Topics  . Alcohol use: No    Subjective:   6 month follow-up on chronic care needs; trying to exercise at least 3 x per week- pleased to see he has lost 3 pounds since last OV; Denies any chest pain, shortness of breath, blurred vision or headache; has cut out all his sugar- does not want to take medication for diabetes at this time;  Needs refills on Lipitor and Viagra to be sent to local pharmacy; also asking for compression socks- has chronic swelling in his lower left leg due to history of complicated ankle fracture/ surgery;    Objective:  Vitals:   11/18/19 0850  BP: 122/70  Pulse: 70  Temp: 98.5 F (36.9 C)  TempSrc: Oral  SpO2: 98%  Weight: 224 lb (101.6 kg)  Height: 5' 5"  (1.651 m)    General: Well developed, well nourished, in no acute distress  Skin : Warm and dry.  Head: Normocephalic and atraumatic  Lungs: Respirations unlabored; clear to auscultation bilaterally without wheeze, rales, rhonchi  CVS exam: normal rate and regular rhythm.  Extremities: No pitting edema, cyanosis, clubbing  Vessels: Symmetric bilaterally  Neurologic: Alert and oriented; speech intact; face symmetrical; moves all extremities well; CNII-XII intact without focal deficit   Assessment:  1. Type 2 diabetes, diet controlled ([place])   2. Vitamin D deficiency   3. Essential hypertension  4. Pedal edema     Plan:  Update labs and prescriptions today; order is given for compression stocking- he will talk to pharmacist about options for treatment- suspect he needs the lowest strength available; Continue to exercise/ work on weight loss goals; Follow-up in 6 months, sooner prn.   This visit occurred during the SARS-CoV-2 public health emergency.  Safety protocols were in place, including screening questions prior to the visit, additional usage of staff PPE, and extensive cleaning of exam room while observing appropriate contact time as indicated for  disinfecting solutions.     No follow-ups on file.  Orders Placed This Encounter  Procedures  . For home use only DME Other see comment    Compression stocking bilateral    Order Specific Question:   Length of Need    Answer:   Lifetime  . Comp Met (CMET)  . Lipid panel  . HgB A1c  . Vitamin D (25 hydroxy)    Requested Prescriptions   Signed Prescriptions Disp Refills  . amLODipine (NORVASC) 5 MG tablet 90 tablet 3    Sig: Take 1 tablet (5 mg total) by mouth daily.  . metoprolol succinate (TOPROL-XL) 25 MG 24 hr tablet 90 tablet 3    Sig: Take 1 tablet (25 mg total) by mouth daily.  . sildenafil (VIAGRA) 100 MG tablet 10 tablet 1    Sig: Take 0.5-1 tablets (50-100 mg total) by mouth daily as needed for erectile dysfunction.  Marland Kitchen atorvastatin (LIPITOR) 40 MG tablet 30 tablet 0    Sig: Take 1 tablet (40 mg total) by mouth daily.

## 2019-12-03 ENCOUNTER — Encounter: Payer: Self-pay | Admitting: Family

## 2019-12-03 ENCOUNTER — Other Ambulatory Visit: Payer: Self-pay | Admitting: Family

## 2019-12-03 DIAGNOSIS — I10 Essential (primary) hypertension: Secondary | ICD-10-CM

## 2019-12-03 MED ORDER — ATORVASTATIN CALCIUM 40 MG PO TABS: 40.0000 mg | ORAL_TABLET | Freq: Every day | ORAL | 3 refills | Status: DC

## 2019-12-03 MED ORDER — METOPROLOL SUCCINATE ER 25 MG PO TB24: 25.0000 mg | ORAL_TABLET | Freq: Every day | ORAL | 3 refills | Status: DC

## 2019-12-03 MED ORDER — AMLODIPINE BESYLATE 5 MG PO TABS: 5.0000 mg | ORAL_TABLET | Freq: Every day | ORAL | 3 refills | Status: DC

## 2019-12-10 ENCOUNTER — Telehealth: Payer: Self-pay

## 2019-12-10 ENCOUNTER — Other Ambulatory Visit: Payer: Self-pay | Admitting: Family

## 2019-12-10 MED ORDER — SILDENAFIL CITRATE 100 MG PO TABS: 50.0000 mg | ORAL_TABLET | Freq: Every day | ORAL | 1 refills | Status: DC | PRN

## 2019-12-10 NOTE — Telephone Encounter (Signed)
1.Medication Requested:sildenafil (VIAGRA) 100 MG tablet  2. Pharmacy (Name, [facility], [place], [place] [address]  3. On Med List: Yes   4. Last Visit with PCP: 4.19.2021   5. Next visit date with PCP: n/a   Agent: Please be advised that RX refills may take up to 3 business days. We ask that you follow-up with your pharmacy.

## 2019-12-13 ENCOUNTER — Telehealth: Payer: Self-pay | Admitting: Family

## 2019-12-13 MED ORDER — SILDENAFIL CITRATE 100 MG PO TABS: 50.0000 mg | ORAL_TABLET | Freq: Every day | ORAL | 1 refills | Status: DC | PRN

## 2019-12-13 NOTE — Telephone Encounter (Signed)
Insurance has denied paying for Viagra; this is not unusual and many patients have to pay for the medication out of pocket.  We will send to local pharmacy and he can discuss with his pharmacist here in [place].

## 2019-12-17 ENCOUNTER — Other Ambulatory Visit: Payer: Self-pay | Admitting: Family

## 2019-12-17 NOTE — Telephone Encounter (Signed)
Called and left message for patient with info.

## 2020-06-01 ENCOUNTER — Other Ambulatory Visit: Payer: Self-pay | Admitting: Family

## 2020-06-01 ENCOUNTER — Telehealth: Payer: Self-pay | Admitting: Family

## 2020-06-01 DIAGNOSIS — I10 Essential (primary) hypertension: Secondary | ICD-10-CM

## 2020-06-01 NOTE — Telephone Encounter (Signed)
Form has been completed &Placed in providers box to review and sign.

## 2020-06-01 NOTE — Telephone Encounter (Signed)
Patient lost handicap sticker and needs another  Please call when ready to pick-up

## 2020-06-02 NOTE — Telephone Encounter (Signed)
Form has been signed, Copy sent to scan.   Patient informed, Original mailed to patient.

## 2020-07-04 ENCOUNTER — Other Ambulatory Visit: Payer: Self-pay | Admitting: Family

## 2020-11-27 ENCOUNTER — Other Ambulatory Visit: Payer: Self-pay | Admitting: Family

## 2020-11-27 DIAGNOSIS — I10 Essential (primary) hypertension: Secondary | ICD-10-CM

## 2020-11-30 ENCOUNTER — Telehealth: Payer: Self-pay | Admitting: Family

## 2020-11-30 NOTE — Telephone Encounter (Signed)
I have attempted to call pt to ask the provider message below. There was no answers I left a message to call back.

## 2020-11-30 NOTE — Telephone Encounter (Signed)
He is due for OV- last seen in 10/2019; please find out whether he is going or staying and I can determine how to handle refills.

## 2020-11-30 NOTE — Telephone Encounter (Signed)
Patient has an appt on Friday. However he is about to run our and wanted to make sure the mail order was able to have him a refill before he runs out    Medication: amLODipine (NORVASC) 5 MG tablet atorvastatin (LIPITOR) 40 MG tablet metoprolol succinate (TOPROL-XL) 25 MG 24 hr tablet Has the patient contacted their pharmacy? Yes.   (If no, request that the patient contact the pharmacy for the refill.) (If yes, when and what did the pharmacy advise?) Call PCP   Preferred Pharmacy (with phone number or street name): Fax:  [phone]     [place], [place] Phone:  [phone]         Agent: Please be advised that RX refills may take up to 3 business days. We ask that you follow-up with your pharmacy.

## 2020-12-01 ENCOUNTER — Other Ambulatory Visit: Payer: Self-pay | Admitting: Family

## 2020-12-01 DIAGNOSIS — I10 Essential (primary) hypertension: Secondary | ICD-10-CM

## 2020-12-01 MED ORDER — ATORVASTATIN CALCIUM 40 MG PO TABS: 1.0000 | ORAL_TABLET | Freq: Every day | ORAL | 0 refills | Status: DC

## 2020-12-01 MED ORDER — METOPROLOL SUCCINATE ER 25 MG PO TB24: 1.0000 | ORAL_TABLET | Freq: Every day | ORAL | 0 refills | Status: DC

## 2020-12-01 MED ORDER — AMLODIPINE BESYLATE 5 MG PO TABS: 1.0000 | ORAL_TABLET | Freq: Every day | ORAL | 0 refills | Status: DC

## 2020-12-04 ENCOUNTER — Ambulatory Visit (HOSPITAL_BASED_OUTPATIENT_CLINIC_OR_DEPARTMENT_OTHER)
Admission: RE | Admit: 2020-12-04 | Discharge: 2020-12-04 | Disposition: A | Discharge status: Home / Self Care | Payer: Medicare HMO | Source: Ambulatory Visit | Attending: Family | Admitting: Family

## 2020-12-04 ENCOUNTER — Encounter: Payer: Self-pay | Admitting: Family

## 2020-12-04 ENCOUNTER — Other Ambulatory Visit: Payer: Self-pay

## 2020-12-04 ENCOUNTER — Ambulatory Visit (INDEPENDENT_AMBULATORY_CARE_PROVIDER_SITE_OTHER): Payer: Medicare HMO | Admitting: Family

## 2020-12-04 VITALS — BP 132/82 | HR 78 | Temp 98.6°F | Ht 64.0 in | Wt 225.0 lb

## 2020-12-04 DIAGNOSIS — M25571 Pain in right ankle and joints of right foot: Secondary | ICD-10-CM | POA: Insufficient documentation

## 2020-12-04 DIAGNOSIS — Z23 Encounter for immunization: Secondary | ICD-10-CM

## 2020-12-04 DIAGNOSIS — Z125 Encounter for screening for malignant neoplasm of prostate: Secondary | ICD-10-CM | POA: Diagnosis not present

## 2020-12-04 DIAGNOSIS — E119 Type 2 diabetes mellitus without complications: Secondary | ICD-10-CM | POA: Diagnosis not present

## 2020-12-04 DIAGNOSIS — I10 Essential (primary) hypertension: Secondary | ICD-10-CM

## 2020-12-04 DIAGNOSIS — E782 Mixed hyperlipidemia: Secondary | ICD-10-CM

## 2020-12-04 LAB — PSA: PSA: 0.33 ng/mL (ref 0.10–4.00)

## 2020-12-04 LAB — CBC WITH DIFFERENTIAL/PLATELET
Basophils Absolute: 0.1 10*3/uL (ref 0.0–0.1)
Basophils Relative: 1.1 % (ref 0.0–3.0)
Eosinophils Absolute: 0.3 10*3/uL (ref 0.0–0.7)
Eosinophils Relative: 2.9 % (ref 0.0–5.0)
HCT: 45.2 % (ref 39.0–52.0)
Hemoglobin: 14.7 g/dL (ref 13.0–17.0)
Lymphocytes Relative: 26.8 % (ref 12.0–46.0)
Lymphs Abs: 2.7 10*3/uL (ref 0.7–4.0)
MCHC: 32.5 g/dL (ref 30.0–36.0)
MCV: 89.3 fl (ref 78.0–100.0)
Monocytes Absolute: 0.7 10*3/uL (ref 0.1–1.0)
Monocytes Relative: 6.6 % (ref 3.0–12.0)
Neutro Abs: 6.2 10*3/uL (ref 1.4–7.7)
Neutrophils Relative %: 62.6 % (ref 43.0–77.0)
Platelets: 211 10*3/uL (ref 150.0–400.0)
RBC: 5.06 Mil/uL (ref 4.22–5.81)
RDW: 13.5 % (ref 11.5–15.5)
WBC: 9.9 10*3/uL (ref 4.0–10.5)

## 2020-12-04 LAB — LIPID PANEL
Cholesterol: 104 mg/dL (ref 0–200)
HDL: 32.3 mg/dL — ABNORMAL LOW (ref >39.00)
LDL Cholesterol: 45 mg/dL (ref 0–99)
NonHDL: 71.44
Total CHOL/HDL Ratio: 3
Triglycerides: 130 mg/dL (ref 0.0–149.0)
VLDL: 26 mg/dL (ref 0.0–40.0)

## 2020-12-04 LAB — MICROALBUMIN / CREATININE URINE RATIO
Creatinine,U: 161.7 mg/dL
Microalb Creat Ratio: 0.4 mg/g (ref 0.0–30.0)
Microalb, Ur: <0.7 mg/dL (ref 0.0–1.9)

## 2020-12-04 LAB — COMPREHENSIVE METABOLIC PANEL
ALT: 17 U/L (ref 0–53)
AST: 18 U/L (ref 0–37)
Albumin: 3.1 g/dL — ABNORMAL LOW (ref 3.5–5.2)
Alkaline Phosphatase: 37 U/L — ABNORMAL LOW (ref 39–117)
BUN: 13 mg/dL (ref 6–23)
CO2: 29 mEq/L (ref 19–32)
Calcium: 8.2 mg/dL — ABNORMAL LOW (ref 8.4–10.5)
Chloride: 106 mEq/L (ref 96–112)
Creatinine, Ser: 0.8 mg/dL (ref 0.40–1.50)
GFR: 97.28 mL/min (ref >60.00)
Glucose, Bld: 111 mg/dL — ABNORMAL HIGH (ref 70–99)
Potassium: 3.8 mEq/L (ref 3.5–5.1)
Sodium: 140 mEq/L (ref 135–145)
Total Bilirubin: 0.5 mg/dL (ref 0.2–1.2)
Total Protein: 4.7 g/dL — ABNORMAL LOW (ref 6.0–8.3)

## 2020-12-04 LAB — HEMOGLOBIN A1C: Hgb A1c MFr Bld: 6.5 % (ref 4.6–6.5)

## 2020-12-04 MED ORDER — ATORVASTATIN CALCIUM 40 MG PO TABS: 1.0000 | ORAL_TABLET | Freq: Every day | ORAL | 3 refills | Status: DC

## 2020-12-04 MED ORDER — SILDENAFIL CITRATE 100 MG PO TABS: 100.0000 mg | ORAL_TABLET | Freq: Every day | ORAL | 3 refills | Status: DC | PRN

## 2020-12-04 MED ORDER — METOPROLOL SUCCINATE ER 25 MG PO TB24: 1.0000 | ORAL_TABLET | Freq: Every day | ORAL | 3 refills | Status: DC

## 2020-12-04 MED ORDER — MELOXICAM 15 MG PO TABS: 15.0000 mg | ORAL_TABLET | Freq: Every day | ORAL | 0 refills | Status: DC

## 2020-12-04 MED ORDER — AMLODIPINE BESYLATE 5 MG PO TABS: 1.0000 | ORAL_TABLET | Freq: Every day | ORAL | 3 refills | Status: DC

## 2020-12-04 NOTE — Progress Notes (Signed)
Brandon Reyes is a 60 y.o. male with the following history as recorded in EpicCare:  Patient Active Problem List   Diagnosis Date Noted  . Newly diagnosed diabetes ([place]) 06/18/2018  . Vitamin D deficiency 03/16/2018  . Encounter for general adult medical examination with abnormal findings 03/16/2018  . Sinus tachycardia 08/23/2017  . Anemia 08/23/2017  . Essential hypertension 12/29/2016  . Arthritis of left ankle 11/17/2016    Current Outpatient Medications  Medication Sig Dispense Refill  . meloxicam (MOBIC) 15 MG tablet Take 1 tablet (15 mg total) by mouth daily. 30 tablet 0  . Omega-3 Fatty Acids (FISH OIL PO) Take by mouth.    Marland Kitchen amLODipine (NORVASC) 5 MG tablet Take 1 tablet (5 mg total) by mouth daily. 90 tablet 3  . atorvastatin (LIPITOR) 40 MG tablet Take 1 tablet (40 mg total) by mouth daily. 90 tablet 3  . metoprolol succinate (TOPROL-XL) 25 MG 24 hr tablet Take 1 tablet (25 mg total) by mouth daily. 90 tablet 3  . sildenafil (VIAGRA) 100 MG tablet Take 1 tablet (100 mg total) by mouth daily as needed for erectile dysfunction. 18 tablet 3   No current facility-administered medications for this visit.    Allergies: Patient has no known allergies.  Past Medical History:  Diagnosis Date  . Arthritis   . Hypertension     Past Surgical History:  Procedure Laterality Date  . ANKLE FRACTURE SURGERY    . INSERTION OF MESH N/A 09/26/2016   Procedure: INSERTION OF MESH;  Surgeon: Greer Pickerel, MD;  Location: [facility];  Service: General;  Laterality: N/A;  . UMBILICAL HERNIA REPAIR N/A 09/26/2016   Procedure: LAPAROSCOPIC ASSISTED UMBILICAL HERNIA REPAIR WITH MESH;  Surgeon: Greer Pickerel, MD;  Location: [facility];  Service: General;  Laterality: N/A;    Family History  Problem Relation Age of Onset  . Hypertension Mother   . Diabetes Mother   . Hypertension Sister   . Hypertension Maternal Grandmother   . Hypertension Sister     Social History   Tobacco Use  . Smoking status:  Never Smoker  . Smokeless tobacco: Never Used  Substance Use Topics  . Alcohol use: No    Subjective:   History of hypertension/ hyperlipidemia/ diet controlled Type 2 Diabetes; Denies any chest pain, shortness of breath, blurred vision or headache  Is concerned about right ankle injury- twisted the ankle as he was stepping off curb yesterday; has been using ACE wrap/ was able to walk to cut his yard yesterday;   Objective:  Vitals:   12/04/20 1048  BP: 132/82  Pulse: 78  Temp: 98.6 F (37 C)  TempSrc: Oral  SpO2: 98%  Weight: 225 lb (102.1 kg)  Height: _0  (1.626 m)    General: Well developed, well nourished, in no acute distress  Skin : Warm and dry.  Head: Normocephalic and atraumatic  Eyes: Sclera and conjunctiva clear; pupils round and reactive to light; extraocular movements intact  Ears: External normal; canals clear; tympanic membranes normal  Oropharynx: Pink, supple. No suspicious lesions  Neck: Supple without thyromegaly, adenopathy  Lungs: Respirations unlabored; clear to auscultation bilaterally without wheeze, rales, rhonchi  CVS exam: normal rate and regular rhythm.  Musculoskeletal: No deformities; no active joint inflammation  Extremities: No edema, cyanosis, clubbing  Vessels: Symmetric bilaterally  Neurologic: Alert and oriented; speech intact; face symmetrical; moves all extremities well; CNII-XII intact without focal deficit   Assessment:  1. Type 2 diabetes, diet controlled ([place])  2. Mixed hyperlipidemia   3. Prostate cancer screening   4. Essential hypertension   5. Acute right ankle pain   6. Need for vaccination     Plan:  1. Update Hgba1c today and urine microalbumin; follow-up in 6 months; 2. Update labs; refill updated; 3. Check PSA; 4. Stable; refill updated; 5. Suspect strain; Rx for Mobic 15 mg qd; update Xray; 6. Prevnar 20 given; plan for pneumovax 23 in 1 year;   This visit occurred during the SARS-CoV-2 public health  emergency.  Safety protocols were in place, including screening questions prior to the visit, additional usage of staff PPE, and extensive cleaning of exam room while observing appropriate contact time as indicated for disinfecting solutions.      No follow-ups on file.  Orders Placed This Encounter  Procedures  . DG Ankle Complete Right    Standing Status:   Future    Number of Occurrences:   1    Standing Expiration Date:   12/04/2021    Order Specific Question:   Reason for Exam (SYMPTOM  OR DIAGNOSIS REQUIRED)    Answer:   right ankle xray    Order Specific Question:   Preferred imaging location?    Answer:   [facility]  . Pneumococcal conjugate vaccine 20-valent (Prevnar 20)  . CBC with Differential/Platelet  . Comp Met (CMET)  . Hemoglobin A1c  . Lipid panel  . PSA  . Urine Microalbumin w/creat. ratio    Requested Prescriptions   Signed Prescriptions Disp Refills  . amLODipine (NORVASC) 5 MG tablet 90 tablet 3    Sig: Take 1 tablet (5 mg total) by mouth daily.  Marland Kitchen atorvastatin (LIPITOR) 40 MG tablet 90 tablet 3    Sig: Take 1 tablet (40 mg total) by mouth daily.  . metoprolol succinate (TOPROL-XL) 25 MG 24 hr tablet 90 tablet 3    Sig: Take 1 tablet (25 mg total) by mouth daily.  . sildenafil (VIAGRA) 100 MG tablet 18 tablet 3    Sig: Take 1 tablet (100 mg total) by mouth daily as needed for erectile dysfunction.  . meloxicam (MOBIC) 15 MG tablet 30 tablet 0    Sig: Take 1 tablet (15 mg total) by mouth daily.

## 2021-04-16 ENCOUNTER — Encounter (HOSPITAL_COMMUNITY): Payer: Self-pay | Admitting: *Deleted

## 2021-04-16 ENCOUNTER — Emergency Department (HOSPITAL_COMMUNITY)
Admission: EM | Admit: 2021-04-16 | Discharge: 2021-04-16 | Disposition: A | Discharge status: Home / Self Care | Payer: Medicare HMO | Attending: Emergency Medicine | Admitting: Emergency Medicine

## 2021-04-16 ENCOUNTER — Other Ambulatory Visit: Payer: Self-pay

## 2021-04-16 DIAGNOSIS — T148XXA Other injury of unspecified body region, initial encounter: Secondary | ICD-10-CM

## 2021-04-16 DIAGNOSIS — S1191XA Laceration without foreign body of unspecified part of neck, initial encounter: Secondary | ICD-10-CM

## 2021-04-16 DIAGNOSIS — Z79899 Other long term (current) drug therapy: Secondary | ICD-10-CM | POA: Insufficient documentation

## 2021-04-16 DIAGNOSIS — Z23 Encounter for immunization: Secondary | ICD-10-CM | POA: Insufficient documentation

## 2021-04-16 DIAGNOSIS — Z87891 Personal history of nicotine dependence: Secondary | ICD-10-CM | POA: Insufficient documentation

## 2021-04-16 DIAGNOSIS — S1181XA Laceration without foreign body of other specified part of neck, initial encounter: Secondary | ICD-10-CM | POA: Insufficient documentation

## 2021-04-16 DIAGNOSIS — I10 Essential (primary) hypertension: Secondary | ICD-10-CM | POA: Insufficient documentation

## 2021-04-16 MED ORDER — CEFAZOLIN SODIUM-DEXTROSE 1-4 GM/50ML-% IV SOLN
1.0000 g | Freq: Once | INTRAVENOUS | Status: AC
  Administered 2021-04-16: 1 g via INTRAVENOUS
  Filled 2021-04-16: qty 50

## 2021-04-16 MED ORDER — TETANUS-DIPHTH-ACELL PERTUSSIS 5-2.5-18.5 LF-MCG/0.5 IM SUSY
0.5000 mL | PREFILLED_SYRINGE | Freq: Once | INTRAMUSCULAR | Status: AC
  Administered 2021-04-16: 0.5 mL via INTRAMUSCULAR
  Filled 2021-04-16: qty 0.5

## 2021-04-16 MED ORDER — LIDOCAINE-EPINEPHRINE (PF) 2 %-1:200000 IJ SOLN
20.0000 mL | Freq: Once | INTRAMUSCULAR | Status: AC
  Administered 2021-04-16: 20 mL
  Filled 2021-04-16: qty 20

## 2021-04-16 NOTE — Progress Notes (Signed)
Orthopedic Tech Progress Note Patient Details:  Brandon Reyes 05-27-1961 [id number] Now Level 1 Trauma downgraded; not needed Patient ID: [name], male   DOB: 04/22/1961, 60 y.o.   MRN: [id number]  [name] 04/16/2021, 3:33 PM
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
## 2021-04-16 NOTE — ED Notes (Signed)
RN reviewed discharge instructions with pt. Pt verbalized understanding and had no further questions. VSS stable upon discharge.

## 2021-04-16 NOTE — Progress Notes (Signed)
   04/16/21 1405  Clinical Encounter Type  Visited With Patient not available  Visit Type Trauma  Referral From Nurse  Consult/Referral To Chaplain    Chaplain responded. The patient is being attended to by the medical team.   There is no support person currently here. Chaplain remains available for follow-up spiritual/emotional support as needed. Chaplain also on unit when notification that patient will be downgraded to level 2. This note was prepared by Jeanine Luz, M.Div..  For questions please contact by phone [phone].
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
## 2021-04-16 NOTE — ED Notes (Signed)
MD at beside suturing neck lac

## 2021-04-16 NOTE — Discharge Instructions (Signed)
You will need to have your sutures removed in 10 to 12 days.  Soap and water.  Return sooner if any signs of infection.  Tylenol for pain.
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
## 2021-04-19 ENCOUNTER — Encounter: Payer: Self-pay | Admitting: Family

## 2021-04-25 ENCOUNTER — Encounter: Payer: Self-pay | Admitting: Emergency Medicine

## 2021-04-25 ENCOUNTER — Ambulatory Visit
Admission: EM | Admit: 2021-04-25 | Discharge: 2021-04-25 | Disposition: A | Discharge status: Home / Self Care | Payer: Medicare HMO

## 2021-04-25 ENCOUNTER — Other Ambulatory Visit: Payer: Self-pay

## 2021-04-25 DIAGNOSIS — Z4802 Encounter for removal of sutures: Secondary | ICD-10-CM | POA: Diagnosis not present
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
## 2021-06-11 ENCOUNTER — Ambulatory Visit: Payer: Medicare HMO | Admitting: Family

## 2021-06-15 ENCOUNTER — Other Ambulatory Visit: Payer: Self-pay

## 2021-06-15 ENCOUNTER — Ambulatory Visit (INDEPENDENT_AMBULATORY_CARE_PROVIDER_SITE_OTHER): Payer: Medicare HMO | Admitting: Family

## 2021-06-15 ENCOUNTER — Encounter: Payer: Self-pay | Admitting: Family

## 2021-06-15 VITALS — BP 122/80 | HR 68 | Temp 97.8°F | Ht 64.0 in | Wt 232.4 lb

## 2021-06-15 DIAGNOSIS — R7309 Other abnormal glucose: Secondary | ICD-10-CM

## 2021-06-15 DIAGNOSIS — I10 Essential (primary) hypertension: Secondary | ICD-10-CM | POA: Diagnosis not present

## 2021-06-15 DIAGNOSIS — E782 Mixed hyperlipidemia: Secondary | ICD-10-CM

## 2021-06-15 LAB — COMPREHENSIVE METABOLIC PANEL
ALT: 26 U/L (ref 0–53)
AST: 21 U/L (ref 0–37)
Albumin: 4.1 g/dL (ref 3.5–5.2)
Alkaline Phosphatase: 51 U/L (ref 39–117)
BUN: 15 mg/dL (ref 6–23)
CO2: 27 mEq/L (ref 19–32)
Calcium: 9.1 mg/dL (ref 8.4–10.5)
Chloride: 103 mEq/L (ref 96–112)
Creatinine, Ser: 0.79 mg/dL (ref 0.40–1.50)
GFR: 97.28 mL/min (ref >60.00)
Glucose, Bld: 115 mg/dL — ABNORMAL HIGH (ref 70–99)
Potassium: 4.3 mEq/L (ref 3.5–5.1)
Sodium: 136 mEq/L (ref 135–145)
Total Bilirubin: 0.4 mg/dL (ref 0.2–1.2)
Total Protein: 6.8 g/dL (ref 6.0–8.3)

## 2021-06-15 LAB — HEMOGLOBIN A1C: Hgb A1c MFr Bld: 6.8 % — ABNORMAL HIGH (ref 4.6–6.5)

## 2021-06-15 NOTE — Progress Notes (Signed)
Brandon Reyes is a 60 y.o. male with the following history as recorded in EpicCare:  Patient Active Problem List   Diagnosis Date Noted   Newly diagnosed diabetes ([place]) 06/18/2018   Vitamin D deficiency 03/16/2018   Encounter for general adult medical examination with abnormal findings 03/16/2018   Sinus tachycardia 08/23/2017   Anemia 08/23/2017   Essential hypertension 12/29/2016   Arthritis of left ankle 11/17/2016    Current Outpatient Medications  Medication Sig Dispense Refill   amLODipine (NORVASC) 5 MG tablet Take 1 tablet (5 mg total) by mouth daily. 90 tablet 3   atorvastatin (LIPITOR) 40 MG tablet Take 1 tablet (40 mg total) by mouth daily. 90 tablet 3   Cyanocobalamin 2500 MCG TABS Take 2,500 mcg by mouth every other day. Vitamin B12     metoprolol succinate (TOPROL-XL) 25 MG 24 hr tablet Take 1 tablet (25 mg total) by mouth daily. 90 tablet 3   Omega-3 Fatty Acids (FISH OIL PO) Take by mouth.     sildenafil (VIAGRA) 100 MG tablet Take 1 tablet (100 mg total) by mouth daily as needed for erectile dysfunction. 18 tablet 3   No current facility-administered medications for this visit.    Allergies: Patient has no known allergies.  Past Medical History:  Diagnosis Date   Arthritis    Hypertension     Past Surgical History:  Procedure Laterality Date   ANKLE FRACTURE SURGERY     INSERTION OF MESH N/A 09/26/2016   Procedure: INSERTION OF MESH;  Surgeon: Greer Pickerel, MD;  Location: [facility];  Service: General;  Laterality: N/A;   UMBILICAL HERNIA REPAIR N/A 09/26/2016   Procedure: LAPAROSCOPIC ASSISTED UMBILICAL HERNIA REPAIR WITH MESH;  Surgeon: Greer Pickerel, MD;  Location: [facility];  Service: General;  Laterality: N/A;    Family History  Problem Relation Age of Onset   Hypertension Mother    Diabetes Mother    Hypertension Sister    Hypertension Maternal Grandmother    Hypertension Sister     Social History   Tobacco Use   Smoking status: Former    Types: Cigarettes    Smokeless tobacco: Never  Substance Use Topics   Alcohol use: Not Currently    Subjective:   6 month follow up on hypertension/ hyperlipidemia; no acute concerns today; Denies any chest pain, shortness of breath, blurred vision or headache.  Defers flu shot today;    Objective:  Vitals:   06/15/21 1223  BP: 122/80  Pulse: 68  Temp: 97.8 F (36.6 C)  TempSrc: Oral  SpO2: 96%  Weight: 232 lb 6.4 oz (105.4 kg)  Height: _0  (1.626 m)    General: Well developed, well nourished, in no acute distress  Skin : Warm and dry.  Head: Normocephalic and atraumatic  Lungs: Respirations unlabored; clear to auscultation bilaterally without wheeze, rales, rhonchi  CVS exam: normal rate and regular rhythm.  Neurologic: Alert and oriented; speech intact; face symmetrical; moves all extremities well; CNII-XII intact without focal deficit   Assessment:  1. Essential hypertension   2. Mixed hyperlipidemia   3. Elevated glucose     Plan:  Stable; continue same medications; Stable; continue same medication; Update Hgba1c today;   This visit occurred during the SARS-CoV-2 public health emergency.  Safety protocols were in place, including screening questions prior to the visit, additional usage of staff PPE, and extensive cleaning of exam room while observing appropriate contact time as indicated for disinfecting solutions.    Return in about  6 months (around 12/13/2021) for cpe.  Orders Placed This Encounter  Procedures   Comp Met (CMET)   Hemoglobin A1c    Requested Prescriptions    No prescriptions requested or ordered in this encounter

## 2021-08-04 ENCOUNTER — Telehealth: Payer: Self-pay | Admitting: Family

## 2021-08-04 DIAGNOSIS — I10 Essential (primary) hypertension: Secondary | ICD-10-CM

## 2021-08-04 DIAGNOSIS — E782 Mixed hyperlipidemia: Secondary | ICD-10-CM
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
## 2021-08-04 MED ORDER — ATORVASTATIN CALCIUM 40 MG PO TABS: 40.0000 mg | ORAL_TABLET | Freq: Every day | ORAL | 1 refills | Status: DC

## 2021-08-04 MED ORDER — METOPROLOL SUCCINATE ER 25 MG PO TB24: 25.0000 mg | ORAL_TABLET | Freq: Every day | ORAL | 1 refills | Status: DC

## 2021-08-04 MED ORDER — AMLODIPINE BESYLATE 5 MG PO TABS: 5.0000 mg | ORAL_TABLET | Freq: Every day | ORAL | 1 refills | Status: DC

## 2021-08-04 NOTE — Telephone Encounter (Signed)
Medication have been filled to new pharmacy.

## 2021-08-04 NOTE — Telephone Encounter (Signed)
Pt called and needs pharmacy changed to the one below. He also needs a refill on all of his medications with the new pharmacy.  [facility] Delivery service:  Fax: [phone]

## 2021-08-09 MED ORDER — AMLODIPINE BESYLATE 5 MG PO TABS: 5.0000 mg | ORAL_TABLET | Freq: Every day | ORAL | 1 refills | Status: DC

## 2021-08-09 MED ORDER — METOPROLOL SUCCINATE ER 25 MG PO TB24: 25.0000 mg | ORAL_TABLET | Freq: Every day | ORAL | 1 refills | Status: DC

## 2021-08-09 MED ORDER — ATORVASTATIN CALCIUM 40 MG PO TABS: 40.0000 mg | ORAL_TABLET | Freq: Every day | ORAL | 1 refills | Status: DC

## 2021-08-09 NOTE — Telephone Encounter (Signed)
Pt states [facility] mail service has not received his medication refills yet. Please advice.

## 2021-08-09 NOTE — Telephone Encounter (Signed)
Patient notified rxs was sent to correct mail order pharmacy.

## 2021-08-11 DIAGNOSIS — Z1152 Encounter for screening for COVID-19: Secondary | ICD-10-CM | POA: Diagnosis not present

## 2021-08-13 ENCOUNTER — Other Ambulatory Visit: Payer: Self-pay

## 2021-08-13 MED ORDER — SILDENAFIL CITRATE 100 MG PO TABS: 100.0000 mg | ORAL_TABLET | Freq: Every day | ORAL | 3 refills | Status: DC | PRN

## 2021-08-13 NOTE — Telephone Encounter (Signed)
Paper rx came through the fax and okayed by provider. Rx has been sent to pharmacy.

## 2021-08-18 DIAGNOSIS — Z1152 Encounter for screening for COVID-19: Secondary | ICD-10-CM | POA: Diagnosis not present

## 2021-09-02 ENCOUNTER — Ambulatory Visit (INDEPENDENT_AMBULATORY_CARE_PROVIDER_SITE_OTHER): Payer: No Typology Code available for payment source | Admitting: Family

## 2021-09-02 ENCOUNTER — Ambulatory Visit: Payer: No Typology Code available for payment source | Attending: Internal Medicine

## 2021-09-02 VITALS — BP 110/70 | HR 84 | Temp 98.3°F | Ht 65.0 in | Wt 235.0 lb

## 2021-09-02 DIAGNOSIS — M25572 Pain in left ankle and joints of left foot: Secondary | ICD-10-CM

## 2021-09-02 DIAGNOSIS — E119 Type 2 diabetes mellitus without complications: Secondary | ICD-10-CM

## 2021-09-02 DIAGNOSIS — Z23 Encounter for immunization: Secondary | ICD-10-CM

## 2021-09-02 DIAGNOSIS — I10 Essential (primary) hypertension: Secondary | ICD-10-CM

## 2021-09-02 DIAGNOSIS — G8929 Other chronic pain: Secondary | ICD-10-CM | POA: Diagnosis not present

## 2021-09-02 DIAGNOSIS — M25571 Pain in right ankle and joints of right foot: Secondary | ICD-10-CM

## 2021-09-02 NOTE — Progress Notes (Signed)
° °  Covid-19 Vaccination Clinic  Name:  Brandon Reyes    MRN: [id number] DOB: 1961/04/18  09/02/2021  [name] was observed post Covid-19 immunization for 15 minutes without incident. He was provided with Vaccine Information Sheet and instruction to access the V-Safe system.   [name] was instructed to call 911 with any severe reactions post vaccine: Difficulty breathing  Swelling of face and throat  A fast heartbeat  A bad rash all over body  Dizziness and weakness   Immunizations Administered     Name Date Dose VIS Date Route   Pfizer Covid-19 Vaccine Bivalent Booster 09/02/2021 12:21 PM 0.3 mL 03/31/2021 Intramuscular   Manufacturer: [place]   Lot: [id number]   [place]: [phone]

## 2021-09-02 NOTE — Progress Notes (Signed)
Brandon Reyes is a 61 y.o. male with the following history as recorded in EpicCare:  Patient Active Problem List   Diagnosis Date Noted   Newly diagnosed diabetes ([place]) 06/18/2018   Vitamin D deficiency 03/16/2018   Encounter for general adult medical examination with abnormal findings 03/16/2018   Sinus tachycardia 08/23/2017   Anemia 08/23/2017   Essential hypertension 12/29/2016   Arthritis of left ankle 11/17/2016    Current Outpatient Medications  Medication Sig Dispense Refill   amLODipine (NORVASC) 5 MG tablet Take 1 tablet (5 mg total) by mouth daily. 90 tablet 1   atorvastatin (LIPITOR) 40 MG tablet Take 1 tablet (40 mg total) by mouth daily. 90 tablet 1   Cyanocobalamin 2500 MCG TABS Take 2,500 mcg by mouth every other day. Vitamin B12     metoprolol succinate (TOPROL-XL) 25 MG 24 hr tablet Take 1 tablet (25 mg total) by mouth daily. 90 tablet 1   Omega-3 Fatty Acids (FISH OIL PO) Take by mouth.     sildenafil (VIAGRA) 100 MG tablet Take 1 tablet (100 mg total) by mouth daily as needed for erectile dysfunction. 18 tablet 3   No current facility-administered medications for this visit.    Allergies: Patient has no known allergies.  Past Medical History:  Diagnosis Date   Arthritis    Hypertension     Past Surgical History:  Procedure Laterality Date   ANKLE FRACTURE SURGERY     INSERTION OF MESH N/A 09/26/2016   Procedure: INSERTION OF MESH;  Surgeon: Greer Pickerel, MD;  Location: [facility];  Service: General;  Laterality: N/A;   UMBILICAL HERNIA REPAIR N/A 09/26/2016   Procedure: LAPAROSCOPIC ASSISTED UMBILICAL HERNIA REPAIR WITH MESH;  Surgeon: Greer Pickerel, MD;  Location: [facility];  Service: General;  Laterality: N/A;    Family History  Problem Relation Age of Onset   Hypertension Mother    Diabetes Mother    Hypertension Sister    Hypertension Maternal Grandmother    Hypertension Sister     Social History   Tobacco Use   Smoking status: Former    Types: Cigarettes    Smokeless tobacco: Never  Substance Use Topics   Alcohol use: Not Currently    Subjective:  Asking for referral to foot specialist due to chronic pain in both ankles; is on disability due to ankle injury;  Also concerned about blood pressure control; has noticed that when [facility] that his pressure is always elevated; however, pressure at home and at other office visits is normal; Denies any chest pain, shortness of breath, blurred vision or headache Does note that cuff used at [facility] often does not completely connect over his arm;    Objective:  Vitals:   09/02/21 1126  BP: 110/70  Pulse: 84  Temp: 98.3 F (36.8 C)  TempSrc: Oral  SpO2: 95%  Weight: 235 lb (106.6 kg)  Height: 5\' 5"  (1.651 m)    General: Well developed, well nourished, in no acute distress  Skin : Warm and dry.  Head: Normocephalic and atraumatic  Lungs: Respirations unlabored; clear to auscultation bilaterally without wheeze, rales, rhonchi  CVS exam: normal rate and regular rhythm.  Musculoskeletal: No deformities; no active joint inflammation  Extremities: No edema, cyanosis, clubbing  Vessels: Symmetric bilaterally  Neurologic: Alert and oriented; speech intact; face symmetrical; moves all extremities well; CNII-XII intact without focal deficit   Assessment:  1. Chronic pain of both ankles   2. Essential hypertension  3. Type 2 diabetes, diet controlled ([place])     Plan:  Refer to podiatrist; Stable; suspect improper procedure being used at [facility]; he will bring cuff and re-check at nurse visit; Hgba1c was stable at 6.8 when checked 2 months ago;   This visit occurred during the SARS-CoV-2 public health emergency.  Safety protocols were in place, including screening questions prior to the visit, additional usage of staff PPE, and extensive cleaning of exam room while observing appropriate contact time as indicated for disinfecting solutions.    Return for  nurse visit 1 month/ patient to bring blood pressure cuff and check blood pressure in office.  Orders Placed This Encounter  Procedures   Ambulatory referral to Podiatry    Referral Priority:   Routine    Referral Type:   Consultation    Referral Reason:   Specialty Services Required    Requested Specialty:   Podiatry    Number of Visits Requested:   1    Requested Prescriptions    No prescriptions requested or ordered in this encounter

## 2021-09-03 ENCOUNTER — Other Ambulatory Visit (HOSPITAL_BASED_OUTPATIENT_CLINIC_OR_DEPARTMENT_OTHER): Payer: Self-pay

## 2021-09-03 MED ORDER — PFIZER COVID-19 VAC BIVALENT 30 MCG/0.3ML IM SUSP
INTRAMUSCULAR | 0 refills | Status: DC
  Filled 2021-09-03: qty 0.3, 1d supply, fill #0

## 2021-09-09 ENCOUNTER — Ambulatory Visit: Payer: No Typology Code available for payment source | Admitting: Podiatry

## 2021-09-14 ENCOUNTER — Telehealth: Payer: Self-pay | Admitting: *Deleted

## 2021-09-14 ENCOUNTER — Other Ambulatory Visit: Payer: Self-pay

## 2021-09-14 ENCOUNTER — Ambulatory Visit (INDEPENDENT_AMBULATORY_CARE_PROVIDER_SITE_OTHER): Payer: No Typology Code available for payment source | Admitting: Podiatry

## 2021-09-14 ENCOUNTER — Encounter: Payer: Self-pay | Admitting: Podiatry

## 2021-09-14 ENCOUNTER — Ambulatory Visit: Payer: No Typology Code available for payment source

## 2021-09-14 DIAGNOSIS — K5900 Constipation, unspecified: Secondary | ICD-10-CM | POA: Insufficient documentation

## 2021-09-14 DIAGNOSIS — M25572 Pain in left ankle and joints of left foot: Secondary | ICD-10-CM | POA: Diagnosis not present

## 2021-09-14 DIAGNOSIS — R194 Change in bowel habit: Secondary | ICD-10-CM | POA: Insufficient documentation

## 2021-09-14 DIAGNOSIS — Z860100 Personal history of colon polyps, unspecified: Secondary | ICD-10-CM | POA: Insufficient documentation

## 2021-09-14 DIAGNOSIS — G8929 Other chronic pain: Secondary | ICD-10-CM | POA: Diagnosis not present

## 2021-09-14 DIAGNOSIS — M7750 Other enthesopathy of unspecified foot: Secondary | ICD-10-CM
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
## 2021-09-14 NOTE — Telephone Encounter (Signed)
He can call to schedule tomorrow I will put it in this evening (I just saw him this AM)

## 2021-09-14 NOTE — Telephone Encounter (Signed)
Patient is calling for the status of his pain management referral that was sent. Please advise.

## 2021-09-14 NOTE — Patient Instructions (Signed)
Call below to schedule your pain management appointment  [facility] [address] [place],  [place]  [address] Main: [phone]

## 2021-09-16 NOTE — Telephone Encounter (Signed)
Notified patient and gave him their #

## 2021-09-16 NOTE — Addendum Note (Signed)
Addended bySherryle Lis, Riad Wagley R on: 09/16/2021 11:43 AM   Modules accepted: Orders

## 2021-09-24 ENCOUNTER — Encounter: Payer: Self-pay | Admitting: Physical Medicine and Rehabilitation

## 2021-09-24 DIAGNOSIS — Z1152 Encounter for screening for COVID-19: Secondary | ICD-10-CM | POA: Diagnosis not present

## 2021-09-30 ENCOUNTER — Ambulatory Visit: Payer: No Typology Code available for payment source

## 2021-09-30 DIAGNOSIS — I10 Essential (primary) hypertension: Secondary | ICD-10-CM

## 2021-09-30 NOTE — Progress Notes (Signed)
BP Readings from Last 3 Encounters:  ?09/02/21 110/70  ?06/15/21 122/80  ?04/25/21 127/84  ? Patient here to check home bp monitor vs manual bp at  office.  ?Home BP machine reads at 125/68 with a pulse of 78.  ?Manual BP 124/76 with a pulse of 72.  ?Patient advised his machine is reading pretty accurately. To continue to monitor at home and let provider know of any changes.  ?

## 2021-10-01 DIAGNOSIS — Z961 Presence of intraocular lens: Secondary | ICD-10-CM | POA: Diagnosis not present

## 2021-10-01 DIAGNOSIS — H52223 Regular astigmatism, bilateral: Secondary | ICD-10-CM | POA: Diagnosis not present

## 2021-10-01 DIAGNOSIS — H524 Presbyopia: Secondary | ICD-10-CM | POA: Diagnosis not present

## 2021-10-03 DIAGNOSIS — Z1152 Encounter for screening for COVID-19: Secondary | ICD-10-CM | POA: Diagnosis not present

## 2021-10-08 DIAGNOSIS — Z1152 Encounter for screening for COVID-19: Secondary | ICD-10-CM | POA: Diagnosis not present

## 2021-10-16 DIAGNOSIS — Z1152 Encounter for screening for COVID-19: Secondary | ICD-10-CM | POA: Diagnosis not present

## 2021-10-23 DIAGNOSIS — Z1152 Encounter for screening for COVID-19: Secondary | ICD-10-CM | POA: Diagnosis not present

## 2021-11-14 DIAGNOSIS — Z1152 Encounter for screening for COVID-19: Secondary | ICD-10-CM | POA: Diagnosis not present

## 2021-11-15 ENCOUNTER — Ambulatory Visit (INDEPENDENT_AMBULATORY_CARE_PROVIDER_SITE_OTHER): Payer: No Typology Code available for payment source

## 2021-11-15 VITALS — Ht 65.0 in | Wt 230.0 lb

## 2021-11-15 DIAGNOSIS — Z0000 Encounter for general adult medical examination without abnormal findings: Secondary | ICD-10-CM

## 2021-11-15 NOTE — Progress Notes (Signed)
?I connected with [name] today by telephone and verified that I am speaking with the correct person using two identifiers. ?Location patient: home ?Location provider: work ?Persons participating in the virtual visit: Mervyn, [place] LPN. ?  ?I discussed the limitations, risks, security and privacy concerns of performing an evaluation and management service by telephone and the availability of in person appointments. I also discussed with the patient that there may be a patient responsible charge related to this service. The patient expressed understanding and verbally consented to this telephonic visit.  ?  ?Interactive audio and video telecommunications were attempted between this provider and patient, however failed, due to patient having technical difficulties OR patient did not have access to video capability.  We continued and completed visit with audio only. ? ?  ? ?Vital signs may be patient reported or missing. ? ?Subjective:  ? Brandon Reyes is a 61 y.o. male who presents for an Initial Medicare Annual Wellness Visit. ? ?Review of Systems    ? ?Cardiac Risk Factors include: advanced age (>41mn, >>8women);diabetes mellitus;hypertension;male gender;obesity (BMI >30kg/m2) ? ?   ?Objective:  ?  ?Today's Vitals  ? 11/15/21 0811  ?Weight: 230 lb (104.3 kg)  ?Height: '5\' 5"'$  (1.651 m)  ? ?Body mass index is 38.27 kg/m?. ? ? ?  11/15/2021  ?  8:15 AM 04/16/2021  ?  2:33 PM 06/18/2018  ? 10:05 AM 07/01/2017  ?  1:39 PM 09/21/2016  ?  8:09 AM 07/30/2016  ?  2:47 PM 05/27/2016  ?  8:32 AM  ?Advanced Directives  ?Does Patient Have a Medical Advance Directive? Yes No Yes No No No No  ?Type of Advance Directive Living will        ?Does patient want to make changes to medical advance directive?   No - Patient declined      ?Would patient like information on creating a medical advance directive?    No - Patient declined   No - patient declined information  ? ? ?Current Medications (verified) ?Outpatient  Encounter Medications as of 11/15/2021  ?Medication Sig  ? amLODipine (NORVASC) 5 MG tablet Take 1 tablet (5 mg total) by mouth daily.  ? atorvastatin (LIPITOR) 40 MG tablet Take 1 tablet (40 mg total) by mouth daily.  ? Cyanocobalamin 2500 MCG TABS Take 2,500 mcg by mouth every other day. Vitamin B12  ? metoprolol succinate (TOPROL-XL) 25 MG 24 hr tablet Take 1 tablet (25 mg total) by mouth daily.  ? Omega-3 Fatty Acids (FISH OIL PO) Take by mouth.  ? sildenafil (VIAGRA) 100 MG tablet Take 1 tablet (100 mg total) by mouth daily as needed for erectile dysfunction.  ? COVID-19 mRNA bivalent vaccine, Pfizer, (PFIZER COVID-19 VAC BIVALENT) injection Inject into the muscle.  ? ?No facility-administered encounter medications on file as of 11/15/2021.  ? ? ?Allergies (verified) ?Patient has no known allergies.  ? ?History: ?Past Medical History:  ?Diagnosis Date  ? Arthritis   ? Hypertension   ? ?Past Surgical History:  ?Procedure Laterality Date  ? ANKLE FRACTURE SURGERY    ? INSERTION OF MESH N/A 09/26/2016  ? Procedure: INSERTION OF MESH;  Surgeon: EGreer Pickerel MD;  Location: [facility];  Service: General;  Laterality: N/A;  ? UMBILICAL HERNIA REPAIR N/A 09/26/2016  ? Procedure: LAPAROSCOPIC ASSISTED UMBILICAL HERNIA REPAIR WITH MESH;  Surgeon: EGreer Pickerel MD;  Location: [facility];  Service: General;  Laterality: N/A;  ? ?Family History  ?Problem Relation Age of Onset  ?  Hypertension Mother   ? Diabetes Mother   ? Hypertension Sister   ? Hypertension Maternal Grandmother   ? Hypertension Sister   ? ?Social History  ? ?Socioeconomic History  ? Marital status: Unknown  ?  Spouse name: Not on file  ? Number of children: 0  ? Years of education: 2  ? Highest education level: Not on file  ?Occupational History  ? Occupation: Unemployed  ?Tobacco Use  ? Smoking status: Former  ?  Types: Cigarettes  ? Smokeless tobacco: Never  ?Vaping Use  ? Vaping Use: Never used  ?Substance and Sexual Activity  ? Alcohol use: Not Currently  ? Drug  use: Not Currently  ? Sexual activity: Not on file  ?Other Topics Concern  ? Not on file  ?Social History Narrative  ? ** Merged History Encounter **  ?    ? Fun/Hobby - Travel   ? ?Social Determinants of Health  ? ?Financial Resource Strain: Low Risk   ? Difficulty of Paying Living Expenses: Not hard at all  ?Food Insecurity: No Food Insecurity  ? Worried About Charity fundraiser in the Last Year: Never true  ? Ran Out of Food in the Last Year: Never true  ?Transportation Needs: No Transportation Needs  ? Lack of Transportation (Medical): No  ? Lack of Transportation (Non-Medical): No  ?Physical Activity: Inactive  ? Days of Exercise per Week: 0 days  ? Minutes of Exercise per Session: 0 min  ?Stress: No Stress Concern Present  ? Feeling of Stress : Not at all  ?Social Connections: Not on file  ? ? ?Tobacco Counseling ?Counseling given: Not Answered ? ? ?Clinical Intake: ? ?Pre-visit preparation completed: Yes ? ?Pain : No/denies pain ? ?  ? ?Nutritional Status: BMI > 30  Obese ?Nutritional Risks: None ?Diabetes: Yes ? ?How often do you need to have someone help you when you read instructions, pamphlets, or other written materials from your doctor or pharmacy?: 1 - Never ?What is the last grade level you completed in school?: 12th grade ? ?Diabetic? Yes ?Nutrition Risk Assessment: ? ?Has the patient had any N/V/D within the last 2 months?  No  ?Does the patient have any non-healing wounds?  No  ?Has the patient had any unintentional weight loss or weight gain?  Yes  ? ?Diabetes: ? ?Is the patient diabetic?  Yes  ?If diabetic, was a CBG obtained today?  No  ?Did the patient bring in their glucometer from home?  No  ?How often do you monitor your CBG's? Once every two months.  ? ?Financial Strains and Diabetes Management: ? ?Are you having any financial strains with the device, your supplies or your medication? No .  ?Does the patient want to be seen by Chronic Care Management for management of their diabetes?  No   ?Would the patient like to be referred to a Nutritionist or for Diabetic Management?  No  ? ?Diabetic Exams: ? ?Diabetic Eye Exam: Overdue for diabetic eye exam. Pt has been advised about the importance in completing this exam. Patient advised to call and schedule an eye exam. ?Diabetic Foot Exam: Overdue, Pt has been advised about the importance in completing this exam. Pt is scheduled for diabetic foot exam on next appointment. ? ? ?Interpreter Needed?: No ? ?Information entered by :: NAllen LPN ? ? ?Activities of Daily Living ? ?  11/15/2021  ?  8:16 AM  ?In your present state of health, do you have any difficulty performing  the following activities:  ?Hearing? 0  ?Vision? 0  ?Difficulty concentrating or making decisions? 0  ?Walking or climbing stairs? 0  ?Dressing or bathing? 0  ?Doing errands, shopping? 0  ?Preparing Food and eating ? N  ?Using the Toilet? N  ?In the past six months, have you accidently leaked urine? N  ?Do you have problems with loss of bowel control? N  ?Managing your Medications? N  ?Managing your Finances? N  ?Housekeeping or managing your Housekeeping? N  ? ? ?Patient Care Team: ?Marrian Salvage, FNP as PCP - General (Internal Medicine) ?Marrian Salvage, [place] (Internal Medicine) ? ?Indicate any recent Medical Services you may have received from other than [facility] providers in the past year (date may be approximate). ? ?   ?Assessment:  ? This is a routine wellness examination for Kylyn. ? ?Hearing/Vision screen ?Vision Screening - Comments:: Regular eye exams,  ? ?Dietary issues and exercise activities discussed: ?Current Exercise Habits: The patient does not participate in regular exercise at present ? ? Goals Addressed   ? ?  ?  ?  ?  ? This Visit's Progress  ?  Patient Stated     ?  11/15/2021, no goals ?  ? ?  ? ?Depression Screen ? ?  11/15/2021  ?  8:16 AM 09/02/2021  ? 11:28 AM 12/04/2020  ? 11:02 AM 11/18/2019  ?  8:56 AM 06/18/2018  ? 10:12 AM 03/16/2018  ?  8:10 AM  ?PHQ 2/9  Scores  ?PHQ - 2 Score 0 0 0 0 0 0  ?  ?Fall Risk ? ?  11/15/2021  ?  8:16 AM 09/02/2021  ? 11:28 AM 12/04/2020  ? 10:51 AM 11/18/2019  ?  8:56 AM 06/18/2018  ? 10:12 AM  ?Fall Risk   ?Falls in the past year?

## 2021-11-15 NOTE — Patient Instructions (Signed)
Mr. Gauthreaux , ?Thank you for taking time to come for your Medicare Wellness Visit. I appreciate your ongoing commitment to your health goals. Please review the following plan we discussed and let me know if I can assist you in the future.  ? ?Screening recommendations/referrals: ?Colonoscopy: completed 12/22/2017, due 12/23/2027 ?Recommended yearly ophthalmology/optometry visit for glaucoma screening and checkup ?Recommended yearly dental visit for hygiene and checkup ? ?Vaccinations: ?Influenza vaccine: due 03/01/2022 ?Pneumococcal vaccine: n/a ?Tdap vaccine: completed 07/01/2017, due 07/02/2027 ?Shingles vaccine: discussed   ?Covid-19:  09/02/2021, 10/23/2019, 09/30/2019 ? ?Advanced directives: Please bring a copy of your POA (Power of Attorney) and/or Living Will to your next appointment.  ? ?Conditions/risks identified: none ? ?Next appointment: Follow up in one year for your annual wellness visit  ? ?Preventive Care 40-64 Years, Male ?Preventive care refers to lifestyle choices and visits with your health care provider that can promote health and wellness. ?What does preventive care include? ?A yearly physical exam. This is also called an annual well check. ?Dental exams once or twice a year. ?Routine eye exams. Ask your health care provider how often you should have your eyes checked. ?Personal lifestyle choices, including: ?Daily care of your teeth and gums. ?Regular physical activity. ?Eating a healthy diet. ?Avoiding tobacco and drug use. ?Limiting alcohol use. ?Practicing safe sex. ?Taking low-dose aspirin every day starting at age 2. ?What happens during an annual well check? ?The services and screenings done by your health care provider during your annual well check will depend on your age, overall health, lifestyle risk factors, and family history of disease. ?Counseling  ?Your health care provider may ask you questions about your: ?Alcohol use. ?Tobacco use. ?Drug use. ?Emotional well-being. ?Home and relationship  well-being. ?Sexual activity. ?Eating habits. ?Work and work Statistician. ?Screening  ?You may have the following tests or measurements: ?Height, weight, and BMI. ?Blood pressure. ?Lipid and cholesterol levels. These may be checked every 5 years, or more frequently if you are over 43 years old. ?Skin check. ?Lung cancer screening. You may have this screening every year starting at age 75 if you have a 30-pack-year history of smoking and currently smoke or have quit within the past 15 years. ?Fecal occult blood test (FOBT) of the stool. You may have this test every year starting at age 26. ?Flexible sigmoidoscopy or colonoscopy. You may have a sigmoidoscopy every 5 years or a colonoscopy every 10 years starting at age 15. ?Prostate cancer screening. Recommendations will vary depending on your family history and other risks. ?Hepatitis C blood test. ?Hepatitis B blood test. ?Sexually transmitted disease (STD) testing. ?Diabetes screening. This is done by checking your blood sugar (glucose) after you have not eaten for a while (fasting). You may have this done every 1-3 years. ?Discuss your test results, treatment options, and if necessary, the need for more tests with your health care provider. ?Vaccines  ?Your health care provider may recommend certain vaccines, such as: ?Influenza vaccine. This is recommended every year. ?Tetanus, diphtheria, and acellular pertussis (Tdap, Td) vaccine. You may need a Td booster every 10 years. ?Zoster vaccine. You may need this after age 43. ?Pneumococcal 13-valent conjugate (PCV13) vaccine. You may need this if you have certain conditions and have not been vaccinated. ?Pneumococcal polysaccharide (PPSV23) vaccine. You may need one or two doses if you smoke cigarettes or if you have certain conditions. ?Talk to your health care provider about which screenings and vaccines you need and how often you need them. ?This information is  not intended to replace advice given to you by your  health care provider. Make sure you discuss any questions you have with your health care provider. ?Document Released: 08/14/2015 Document Revised: 04/06/2016 Document Reviewed: 05/19/2015 ?Elsevier Interactive Patient Education ? 2017 [employer]. ? ?Fall Prevention in the Home ?Falls can cause injuries. They can happen to people of all ages. There are many things you can do to make your home safe and to help prevent falls. ?What can I do on the outside of my home? ?Regularly fix the edges of walkways and driveways and fix any cracks. ?Remove anything that might make you trip as you walk through a door, such as a raised step or threshold. ?Trim any bushes or trees on the path to your home. ?Use bright outdoor lighting. ?Clear any walking paths of anything that might make someone trip, such as rocks or tools. ?Regularly check to see if handrails are loose or broken. Make sure that both sides of any steps have handrails. ?Any raised decks and porches should have guardrails on the edges. ?Have any leaves, snow, or ice cleared regularly. ?Use sand or salt on walking paths during winter. ?Clean up any spills in your garage right away. This includes oil or grease spills. ?What can I do in the bathroom? ?Use night lights. ?Install grab bars by the toilet and in the tub and shower. Do not use towel bars as grab bars. ?Use non-skid mats or decals in the tub or shower. ?If you need to sit down in the shower, use a plastic, non-slip stool. ?Keep the floor dry. Clean up any water that spills on the floor as soon as it happens. ?Remove soap buildup in the tub or shower regularly. ?Attach bath mats securely with double-sided non-slip rug tape. ?Do not have throw rugs and other things on the floor that can make you trip. ?What can I do in the bedroom? ?Use night lights. ?Make sure that you have a light by your bed that is easy to reach. ?Do not use any sheets or blankets that are too big for your bed. They should not hang down  onto the floor. ?Have a firm chair that has side arms. You can use this for support while you get dressed. ?Do not have throw rugs and other things on the floor that can make you trip. ?What can I do in the kitchen? ?Clean up any spills right away. ?Avoid walking on wet floors. ?Keep items that you use a lot in easy-to-reach places. ?If you need to reach something above you, use a strong step stool that has a grab bar. ?Keep electrical cords out of the way. ?Do not use floor polish or wax that makes floors slippery. If you must use wax, use non-skid floor wax. ?Do not have throw rugs and other things on the floor that can make you trip. ?What can I do with my stairs? ?Do not leave any items on the stairs. ?Make sure that there are handrails on both sides of the stairs and use them. Fix handrails that are broken or loose. Make sure that handrails are as long as the stairways. ?Check any carpeting to make sure that it is firmly attached to the stairs. Fix any carpet that is loose or worn. ?Avoid having throw rugs at the top or bottom of the stairs. If you do have throw rugs, attach them to the floor with carpet tape. ?Make sure that you have a light switch at the top of the  stairs and the bottom of the stairs. If you do not have them, ask someone to add them for you. ?What else can I do to help prevent falls? ?Wear shoes that: ?Do not have high heels. ?Have rubber bottoms. ?Are comfortable and fit you well. ?Are closed at the toe. Do not wear sandals. ?If you use a stepladder: ?Make sure that it is fully opened. Do not climb a closed stepladder. ?Make sure that both sides of the stepladder are locked into place. ?Ask someone to hold it for you, if possible. ?Clearly mark and make sure that you can see: ?Any grab bars or handrails. ?First and last steps. ?Where the edge of each step is. ?Use tools that help you move around (mobility aids) if they are needed. These include: ?Canes. ?Walkers. ?Scooters. ?Crutches. ?Turn  on the lights when you go into a dark area. Replace any light bulbs as soon as they burn out. ?Set up your furniture so you have a clear path. Avoid moving your furniture around. ?If any of your floors

## 2021-11-20 DIAGNOSIS — Z1152 Encounter for screening for COVID-19: Secondary | ICD-10-CM | POA: Diagnosis not present

## 2021-11-26 DIAGNOSIS — Z1152 Encounter for screening for COVID-19: Secondary | ICD-10-CM | POA: Diagnosis not present

## 2021-11-28 ENCOUNTER — Other Ambulatory Visit: Payer: Self-pay | Admitting: Family

## 2021-12-04 DIAGNOSIS — Z1152 Encounter for screening for COVID-19: Secondary | ICD-10-CM | POA: Diagnosis not present

## 2021-12-07 ENCOUNTER — Encounter
Payer: No Typology Code available for payment source | Attending: Physical Medicine & Rehabilitation | Admitting: Physical Medicine & Rehabilitation

## 2021-12-07 ENCOUNTER — Encounter: Payer: Self-pay | Admitting: Physical Medicine & Rehabilitation

## 2021-12-07 VITALS — BP 132/84 | HR 80 | Ht 65.0 in | Wt 229.0 lb

## 2021-12-07 DIAGNOSIS — E119 Type 2 diabetes mellitus without complications: Secondary | ICD-10-CM | POA: Diagnosis not present

## 2021-12-07 DIAGNOSIS — M25572 Pain in left ankle and joints of left foot: Secondary | ICD-10-CM | POA: Insufficient documentation

## 2021-12-07 MED ORDER — DULOXETINE HCL 30 MG PO CPEP: 30.0000 mg | ORAL_CAPSULE | Freq: Every day | ORAL | 2 refills | Status: DC

## 2021-12-07 NOTE — Progress Notes (Signed)
? ?Subjective:  ? ? Patient ID: Brandon Reyes, male    DOB: 09-28-1960, 61 y.o.   MRN: [id number] ? ?HPI ? ?61 year old male here with PMH of type 2 DM, HTN here for chronic left ankle pain.  He broke his ankle in 2015 when he stepped off a curb and had ORIF.   He reports he had 2 other surgeries after this including ankle arthrodesis by Dr. Nicki Reaper at [facility]. He continues to have severe pain since this time. Pain is constant but worsened by walking. He walks with a cane.  Pain is sharp and achy.  Ankle brace did not help his pain.  He had ankle injections without benefit. ?Patient reports he takes tylenol with minimal benefit. Reports percocet helped his pain in the past. When asked if he took lyrica, gabapentin his he wasn't certain but his partner thought he used these without benefit. She said he tried tramadol and it didn't help. Lidocaine cream and patch didn't help the pain. Has not tried TENS unit but has access to TENS. ? ? ? ?Pain Inventory ?Average Pain 8 ?Pain Right Now 8 ?My pain is constant and aching ? ?In the last 24 hours, has pain interfered with the following? ?General activity 10 ?Relation with others 8 ?Enjoyment of life 8 ?What TIME of day is your pain at its worst? daytime ?Sleep (in general) Fair ? ?Pain is worse with: walking ?Pain improves with: rest ?Relief from Meds: 8 ? ?use a cane ?how many minutes can you walk? 5 minutes ?ability to climb steps?  yes ?do you drive?  yes ?Do you have any goals in this area?  yes ? ?disabled: date disabled Maybe 4 years ago ?I need assistance with the following:  household duties ?Do you have any goals in this area?  yes ? ?trouble walking ? ?Any changes since last visit?  no ? ?Any changes since last visit?  no ? ? ? ?Family History  ?Problem Relation Age of Onset  ? Hypertension Mother   ? Diabetes Mother   ? Hypertension Sister   ? Hypertension Maternal Grandmother   ? Hypertension Sister   ? ?Social History  ? ?Socioeconomic History  ? Marital status:  Unknown  ?  Spouse name: Not on file  ? Number of children: 0  ? Years of education: 43  ? Highest education level: Not on file  ?Occupational History  ? Occupation: Unemployed  ?Tobacco Use  ? Smoking status: Former  ?  Types: Cigarettes  ? Smokeless tobacco: Never  ?Vaping Use  ? Vaping Use: Never used  ?Substance and Sexual Activity  ? Alcohol use: Not Currently  ? Drug use: Not Currently  ? Sexual activity: Not on file  ?Other Topics Concern  ? Not on file  ?Social History Narrative  ? ** Merged History Encounter **  ?    ? Fun/Hobby - Travel   ? ?Social Determinants of Health  ? ?Financial Resource Strain: Low Risk   ? Difficulty of Paying Living Expenses: Not hard at all  ?Food Insecurity: No Food Insecurity  ? Worried About Charity fundraiser in the Last Year: Never true  ? Ran Out of Food in the Last Year: Never true  ?Transportation Needs: No Transportation Needs  ? Lack of Transportation (Medical): No  ? Lack of Transportation (Non-Medical): No  ?Physical Activity: Inactive  ? Days of Exercise per Week: 0 days  ? Minutes of Exercise per Session: 0 min  ?Stress: No  Stress Concern Present  ? Feeling of Stress : Not at all  ?Social Connections: Not on file  ? ?Past Surgical History:  ?Procedure Laterality Date  ? ANKLE FRACTURE SURGERY    ? INSERTION OF MESH N/A 09/26/2016  ? Procedure: INSERTION OF MESH;  Surgeon: Greer Pickerel, MD;  Location: [facility];  Service: General;  Laterality: N/A;  ? UMBILICAL HERNIA REPAIR N/A 09/26/2016  ? Procedure: LAPAROSCOPIC ASSISTED UMBILICAL HERNIA REPAIR WITH MESH;  Surgeon: Greer Pickerel, MD;  Location: [facility];  Service: General;  Laterality: N/A;  ? ?Past Medical History:  ?Diagnosis Date  ? Arthritis   ? Hypertension   ? ?BP 132/84   Pulse 80   Ht '5\' 5"'$  (1.651 m)   Wt 229 lb (103.9 kg)   SpO2 98%   BMI 38.11 kg/m?  ? ?Opioid Risk Score:   ?Fall Risk Score:  `1 ? ?Depression screen PHQ 2/9 ? ? ?  12/07/2021  ? 11:10 AM 11/15/2021  ?  8:16 AM 09/02/2021  ? 11:28 AM 12/04/2020  ?  11:02 AM 11/18/2019  ?  8:56 AM 06/18/2018  ? 10:12 AM 03/16/2018  ?  8:10 AM  ?Depression screen PHQ 2/9  ?Decreased Interest 3 0 0 0 0 0 0  ?Down, Depressed, Hopeless 0 0 0 0 0 0   ?PHQ - 2 Score 3 0 0 0 0 0 0  ?Altered sleeping 2        ?Tired, decreased energy 3        ?Change in appetite 0        ?Feeling bad or failure about yourself  0        ?Trouble concentrating 1        ?Moving slowly or fidgety/restless 0        ?Suicidal thoughts 0        ?PHQ-9 Score 9        ? ? ?Review of Systems  ?Musculoskeletal:  Positive for gait problem.  ?     Left ankle pain  ?All other systems reviewed and are negative. ? ?   ?Objective:  ? Physical Exam ? ?Gen: no distress, normal appearing ?HEENT: oral mucosa pink and moist, NCAT ?Cardio: Reg rate ?Chest: normal effort, normal rate of breathing ?Abd: soft, non-distended ?Ext: no edema, pedal pulses intact ?Psych: pleasant, normal affect ?Skin: intact ?Neuro: Alert and oriented ?CN 2-12 grossly intact ?SLR negative ?Musculoskeletal: 5/5 in b/l UE and RLE, 4+/5 in proximal LLE limited by pain. Minimal ROM at L ankle ?No paraspinal tenderness ?L foot with well healed surgical incisions on anterior and lateral ankle ?Antalgic gait, walks with cane ?Mild left ankle swelling ?Medial ankle tenderness to palpation ?No tenderness over peroneal tendons ?Neg tinels at tarsal tunnel at ankle ?Antalgic gait ?DP and PT pulses intact ? ? ?Ankle xray R 07/30/2016 "Postoperative changes. Developing arthritis of the tibiotalar ?articulation with probable loose bodies." ? ? ?  Latest Ref Rng & Units 06/15/2021  ? 12:35 PM 12/04/2020  ? 11:41 AM 11/18/2019  ?  9:23 AM  ?CMP  ?Glucose 70 - 99 mg/dL 115   111   89    ?BUN 6 - 23 mg/dL '15   13   13    '$ ?Creatinine 0.40 - 1.50 mg/dL 0.79   0.80   0.82    ?Sodium 135 - 145 mEq/L 136   140   138    ?Potassium 3.5 - 5.1 mEq/L 4.3   3.8   4.3    ?  Chloride 96 - 112 mEq/L 103   106   106    ?CO2 19 - 32 mEq/L '27   29   28    '$ ?Calcium 8.4 - 10.5 mg/dL 9.1    8.2   8.3    ?Total Protein 6.0 - 8.3 g/dL 6.8   4.7   5.3    ?Total Bilirubin 0.2 - 1.2 mg/dL 0.4   0.5   0.4    ?Alkaline Phos 39 - 117 U/L 51   37   34    ?AST 0 - 37 U/L '21   18   22    '$ ?ALT 0 - 53 U/L '26   17   24    '$ ? ? ?   ?Assessment & Plan:  ? ?Chronic L ankle pain after ankle fracture and multiple surgeries ?-s/p ORIF and later ankle arthrodesis, seen by podiatry without any further procedural options ?-Reports poor results with multiple other medications and treatments ? -start duloxitine '30mg'$  Daily for chronic pain ? -Trial of Tens unit ? -Consider repeat xray next visit ? ?DM type 2 ?-Discussed importance of glucose control to prevent diabetic peripheral neuropathy. ? ?

## 2021-12-12 DIAGNOSIS — Z1152 Encounter for screening for COVID-19: Secondary | ICD-10-CM | POA: Diagnosis not present

## 2021-12-14 ENCOUNTER — Encounter: Payer: Self-pay | Admitting: Family

## 2021-12-14 ENCOUNTER — Other Ambulatory Visit (HOSPITAL_BASED_OUTPATIENT_CLINIC_OR_DEPARTMENT_OTHER): Payer: Self-pay

## 2021-12-14 ENCOUNTER — Other Ambulatory Visit: Payer: Self-pay | Admitting: Family

## 2021-12-14 ENCOUNTER — Telehealth: Payer: Self-pay | Admitting: *Deleted

## 2021-12-14 ENCOUNTER — Ambulatory Visit (INDEPENDENT_AMBULATORY_CARE_PROVIDER_SITE_OTHER): Payer: No Typology Code available for payment source | Admitting: Family

## 2021-12-14 VITALS — BP 122/68 | HR 60 | Temp 98.3°F | Ht 65.0 in | Wt 227.0 lb

## 2021-12-14 DIAGNOSIS — Z125 Encounter for screening for malignant neoplasm of prostate: Secondary | ICD-10-CM | POA: Diagnosis not present

## 2021-12-14 DIAGNOSIS — E119 Type 2 diabetes mellitus without complications: Secondary | ICD-10-CM

## 2021-12-14 DIAGNOSIS — Z13220 Encounter for screening for lipoid disorders: Secondary | ICD-10-CM | POA: Diagnosis not present

## 2021-12-14 DIAGNOSIS — Z0000 Encounter for general adult medical examination without abnormal findings: Secondary | ICD-10-CM | POA: Diagnosis not present

## 2021-12-14 LAB — LIPID PANEL
Cholesterol: 149 mg/dL (ref 0–200)
HDL: 46.9 mg/dL (ref >39.00)
LDL Cholesterol: 71 mg/dL (ref 0–99)
NonHDL: 101.96
Total CHOL/HDL Ratio: 3
Triglycerides: 157 mg/dL — ABNORMAL HIGH (ref 0.0–149.0)
VLDL: 31.4 mg/dL (ref 0.0–40.0)

## 2021-12-14 LAB — COMPREHENSIVE METABOLIC PANEL
ALT: 32 U/L (ref 0–53)
AST: 25 U/L (ref 0–37)
Albumin: 3.7 g/dL (ref 3.5–5.2)
Alkaline Phosphatase: 46 U/L (ref 39–117)
BUN: 14 mg/dL (ref 6–23)
CO2: 27 mEq/L (ref 19–32)
Calcium: 9.2 mg/dL (ref 8.4–10.5)
Chloride: 102 mEq/L (ref 96–112)
Creatinine, Ser: 0.88 mg/dL (ref 0.40–1.50)
GFR: 93.84 mL/min (ref >60.00)
Glucose, Bld: 88 mg/dL (ref 70–99)
Potassium: 4.2 mEq/L (ref 3.5–5.1)
Sodium: 138 mEq/L (ref 135–145)
Total Bilirubin: 0.3 mg/dL (ref 0.2–1.2)
Total Protein: 5.7 g/dL — ABNORMAL LOW (ref 6.0–8.3)

## 2021-12-14 LAB — CBC WITH DIFFERENTIAL/PLATELET
Basophils Absolute: 0.1 10*3/uL (ref 0.0–0.1)
Basophils Relative: 0.8 % (ref 0.0–3.0)
Eosinophils Absolute: 0.3 10*3/uL (ref 0.0–0.7)
Eosinophils Relative: 3.4 % (ref 0.0–5.0)
HCT: 43.4 % (ref 39.0–52.0)
Hemoglobin: 13.9 g/dL (ref 13.0–17.0)
Lymphocytes Relative: 27.4 % (ref 12.0–46.0)
Lymphs Abs: 2.7 10*3/uL (ref 0.7–4.0)
MCHC: 32 g/dL (ref 30.0–36.0)
MCV: 91.3 fl (ref 78.0–100.0)
Monocytes Absolute: 0.7 10*3/uL (ref 0.1–1.0)
Monocytes Relative: 7.3 % (ref 3.0–12.0)
Neutro Abs: 6.1 10*3/uL (ref 1.4–7.7)
Neutrophils Relative %: 61.1 % (ref 43.0–77.0)
Platelets: 216 10*3/uL (ref 150.0–400.0)
RBC: 4.75 Mil/uL (ref 4.22–5.81)
RDW: 14.2 % (ref 11.5–15.5)
WBC: 10 10*3/uL (ref 4.0–10.5)

## 2021-12-14 LAB — PSA: PSA: 0.28 ng/mL (ref 0.10–4.00)

## 2021-12-14 LAB — HEMOGLOBIN A1C: Hgb A1c MFr Bld: 6.1 % (ref 4.6–6.5)

## 2021-12-14 LAB — MICROALBUMIN / CREATININE URINE RATIO
Creatinine,U: 108.7 mg/dL
Microalb Creat Ratio: 0.6 mg/g (ref 0.0–30.0)
Microalb, Ur: <0.7 mg/dL (ref 0.0–1.9)

## 2021-12-14 MED ORDER — ZOSTER VAC RECOMB ADJUVANTED 50 MCG/0.5ML IM SUSR
INTRAMUSCULAR | 1 refills | Status: DC
  Filled 2021-12-14: qty 1, 1d supply, fill #0

## 2021-12-14 NOTE — Telephone Encounter (Signed)
[name] called and reports that Brandon Reyes cannot tolerate the Duloxetine that was prescribed at his visit. It was making him dizzy and nauseous.  ?

## 2021-12-14 NOTE — Progress Notes (Signed)
?Brandon Reyes is a 61 y.o. male with the following history as recorded in EpicCare:  ?Patient Active Problem List  ? Diagnosis Date Noted  ? Change in bowel habit 09/14/2021  ? Constipation 09/14/2021  ? Morbid obesity ([place]) 09/14/2021  ? Personal history of colonic polyps 09/14/2021  ? Arthritis of left subtalar joint 10/25/2018  ? Newly diagnosed diabetes ([place]) 06/18/2018  ? Prediabetes 04/03/2018  ? Vitamin D deficiency 03/16/2018  ? Encounter for general adult medical examination with abnormal findings 03/16/2018  ? Sinus tachycardia 08/23/2017  ? Anemia 08/23/2017  ? Pain from implanted hardware 05/18/2017  ? Essential hypertension 12/29/2016  ? Arthritis of left ankle 11/17/2016  ?  ?Current Outpatient Medications  ?Medication Sig Dispense Refill  ? amLODipine (NORVASC) 5 MG tablet Take 1 tablet (5 mg total) by mouth daily. 90 tablet 1  ? atorvastatin (LIPITOR) 40 MG tablet Take 1 tablet (40 mg total) by mouth daily. 90 tablet 1  ? Cyanocobalamin 2500 MCG TABS Take 2,500 mcg by mouth every other day. Vitamin B12    ? metoprolol succinate (TOPROL-XL) 25 MG 24 hr tablet Take 1 tablet (25 mg total) by mouth daily. 90 tablet 1  ? Omega-3 Fatty Acids (FISH OIL PO) Take by mouth.    ? sildenafil (VIAGRA) 100 MG tablet TAKE 1 TABLET DAILY AS     NEEDED FOR ERECTILE        DYSFUNCTION 18 tablet 3  ? ?No current facility-administered medications for this visit.  ?  ?Allergies: Tylenol [acetaminophen]  ?Past Medical History:  ?Diagnosis Date  ? Arthritis   ? Hypertension   ?  ?Past Surgical History:  ?Procedure Laterality Date  ? ANKLE FRACTURE SURGERY    ? INSERTION OF MESH N/A 09/26/2016  ? Procedure: INSERTION OF MESH;  Surgeon: Greer Pickerel, MD;  Location: [facility];  Service: General;  Laterality: N/A;  ? UMBILICAL HERNIA REPAIR N/A 09/26/2016  ? Procedure: LAPAROSCOPIC ASSISTED UMBILICAL HERNIA REPAIR WITH MESH;  Surgeon: Greer Pickerel, MD;  Location: [facility];  Service: General;  Laterality: N/A;  ?  ?Family History   ?Problem Relation Age of Onset  ? Hypertension Mother   ? Diabetes Mother   ? Hypertension Sister   ? Hypertension Maternal Grandmother   ? Hypertension Sister   ?  ?Social History  ? ?Tobacco Use  ? Smoking status: Former  ?  Types: Cigarettes  ? Smokeless tobacco: Never  ?Substance Use Topics  ? Alcohol use: Not Currently  ?  ?Subjective:  ?Patient presents for yearly CPE; accompanied by his wife today;  ?Continuing to struggle with chronic pain of left ankle- not able to tolerate the Cymbalta given by pain management specialist; scheduled to go back in early June;  ? ?Review of Systems  ?Constitutional: Negative.   ?HENT: Negative.    ?Eyes: Negative.   ?Respiratory: Negative.    ?Cardiovascular: Negative.   ?Gastrointestinal: Negative.   ?Genitourinary: Negative.   ?Musculoskeletal:  Positive for joint pain.  ?Skin: Negative.   ?Neurological: Negative.   ?Endo/Heme/Allergies: Negative.   ?Psychiatric/Behavioral: Negative.    ? ? ? ? ?Objective:  ?Vitals:  ? 12/14/21 1258  ?BP: 122/68  ?Pulse: 60  ?Temp: 98.3 ?F (36.8 ?C)  ?TempSrc: Oral  ?SpO2: 97%  ?Weight: 227 lb (103 kg)  ?Height: _0  (1.651 m)  ?  ?General: Well developed, well nourished, in no acute distress  ?Skin : Warm and dry.  ?Head: Normocephalic and atraumatic  ?Eyes: Sclera and conjunctiva clear; pupils  round and reactive to light; extraocular movements intact  ?Ears: External normal; canals clear; tympanic membranes normal  ?Oropharynx: Pink, supple. No suspicious lesions  ?Neck: Supple without thyromegaly, adenopathy  ?Lungs: Respirations unlabored; clear to auscultation bilaterally without wheeze, rales, rhonchi  ?CVS exam: normal rate and regular rhythm.  ?Abdomen: Soft; nontender; nondistended; normoactive bowel sounds; no masses or hepatosplenomegaly  ?Musculoskeletal: No deformities; no active joint inflammation  ?Extremities: No edema, cyanosis, clubbing  ?Vessels: Symmetric bilaterally  ?Neurologic: Alert and oriented; speech intact;  face symmetrical; moves all extremities well; CNII-XII intact without focal deficit  ? ?Assessment:  ?1. PE (physical exam), annual   ?2. Lipid screening   ?3. Prostate cancer screening   ?4. Type 2 diabetes mellitus without complication, without long-term current use of insulin ([place])   ?5. Morbid obesity (HCC) Chronic  ?  ?Plan:  ?Age appropriate preventive healthcare needs addressed; encouraged regular eye doctor and dental exams; encouraged regular exercise; will update labs and refills as needed today; follow-up to be determined; ?He will get Shingrix at his local pharmacy;  ? ?No follow-ups on file.  ?Orders Placed This Encounter  ?Procedures  ? CBC with Differential/Platelet  ? Comp Met (CMET)  ? Lipid panel  ? PSA  ? Hemoglobin A1c  ?  ?Requested Prescriptions  ? ? No prescriptions requested or ordered in this encounter  ?  ? ?

## 2021-12-18 DIAGNOSIS — Z1152 Encounter for screening for COVID-19: Secondary | ICD-10-CM | POA: Diagnosis not present

## 2021-12-24 DIAGNOSIS — Z1152 Encounter for screening for COVID-19: Secondary | ICD-10-CM | POA: Diagnosis not present

## 2022-01-04 ENCOUNTER — Encounter
Payer: No Typology Code available for payment source | Attending: Physical Medicine & Rehabilitation | Admitting: Physical Medicine & Rehabilitation

## 2022-01-04 ENCOUNTER — Encounter: Payer: Self-pay | Admitting: Physical Medicine & Rehabilitation

## 2022-01-04 VITALS — BP 129/82 | HR 71 | Ht 65.0 in | Wt 233.6 lb

## 2022-01-04 DIAGNOSIS — Z79891 Long term (current) use of opiate analgesic: Secondary | ICD-10-CM | POA: Insufficient documentation

## 2022-01-04 DIAGNOSIS — G894 Chronic pain syndrome: Secondary | ICD-10-CM | POA: Insufficient documentation

## 2022-01-04 DIAGNOSIS — M25572 Pain in left ankle and joints of left foot: Secondary | ICD-10-CM | POA: Diagnosis not present

## 2022-01-04 DIAGNOSIS — E119 Type 2 diabetes mellitus without complications: Secondary | ICD-10-CM | POA: Insufficient documentation

## 2022-01-04 DIAGNOSIS — Z5181 Encounter for therapeutic drug level monitoring: Secondary | ICD-10-CM | POA: Diagnosis not present

## 2022-01-04 MED ORDER — OXYCODONE HCL 5 MG PO TABS: 5.0000 mg | ORAL_TABLET | Freq: Two times a day (BID) | ORAL | 0 refills | Status: DC | PRN

## 2022-01-04 MED ORDER — DICLOFENAC SODIUM 1 % EX GEL: 4.0000 g | Freq: Four times a day (QID) | CUTANEOUS | Status: DC

## 2022-01-04 NOTE — Progress Notes (Signed)
Subjective:    Patient ID: Brandon Reyes, male    DOB: [birth year], 61 y.o.   MRN: [id number]  HPI    61 year old male here with PMH of type 2 DM, HTN here for chronic left ankle pain.  He broke his ankle in 2015 when he stepped off a curb and had ORIF.   He reports he had 2 other surgeries after this including ankle arthrodesis by Dr. Nicki Reaper at [facility]. He continues to have severe pain since this time. Pain is constant but worsened by walking. He walks with a cane.  Pain is sharp and achy.  Ankle brace did not help his pain.  He had ankle injections without benefit. Patient reports he takes tylenol with minimal benefit. Reports percocet helped his pain in the past. When asked if he took lyrica, gabapentin his he wasn't certain but his partner thought he used these without benefit. She said he tried tramadol and it didn't help. Lidocaine cream and patch didn't help the pain. Has not tried TENS unit but has access to TENS.    Interval History 61 year old male with past medical history of type 2 diabetes and hypertension here for follow-up of his left ankle pain.  Patient reports duloxetine made him dizzy and he was unable to tolerate this medication.  Pain continues to be constant and severe in his ankle.  He reports it woke him up from sleep last night.  It is interfering with his function.  He reports hydrocodone did not improve the pain when he used it in the past.  Oral NSAIDs provided minimal benefit in the past.  He walks with a cane to help improve the pain.  Patient reports allergy to Tylenol. Pt did not help in the past.    Pain Inventory Average Pain 8 Pain Right Now 9 My pain is sharp  In the last 24 hours, has pain interfered with the following? General activity 10 Relation with others 2 Enjoyment of life 3 What TIME of day is your pain at its worst? morning , daytime, evening, and night Sleep (in general) Poor  Pain is worse with: walking, inactivity, and standing Pain improves  with: rest Relief from Meds:  not answered  Family History  Problem Relation Age of Onset   Hypertension Mother    Diabetes Mother    Hypertension Sister    Hypertension Maternal Grandmother    Hypertension Sister    Social History   Socioeconomic History   Marital status: Unknown    Spouse name: Not on file   Number of children: 0   Years of education: 12   Highest education level: Not on file  Occupational History   Occupation: Unemployed  Tobacco Use   Smoking status: Former    Types: Cigarettes   Smokeless tobacco: Never  Scientific laboratory technician Use: Never used  Substance and Sexual Activity   Alcohol use: Not Currently   Drug use: Not Currently   Sexual activity: Not on file  Other Topics Concern   Not on file  Social History Narrative   ** Merged History Encounter **       Fun/Hobby - Travel    Social Determinants of Health   Financial Resource Strain: Low Risk    Difficulty of Paying Living Expenses: Not hard at all  Food Insecurity: No Food Insecurity   Worried About Charity fundraiser in the Last Year: Never true   [place] in the Last Year:  Never true  Transportation Needs: No Transportation Needs   Lack of Transportation (Medical): No   Lack of Transportation (Non-Medical): No  Physical Activity: Inactive   Days of Exercise per Week: 0 days   Minutes of Exercise per Session: 0 min  Stress: No Stress Concern Present   Feeling of Stress : Not at all  Social Connections: Not on file   Past Surgical History:  Procedure Laterality Date   ANKLE FRACTURE SURGERY     INSERTION OF MESH N/A 09/26/2016   Procedure: INSERTION OF MESH;  Surgeon: Greer Pickerel, MD;  [facility];  Service: General;  Laterality: N/A;   UMBILICAL HERNIA REPAIR N/A 09/26/2016   Procedure: LAPAROSCOPIC ASSISTED UMBILICAL HERNIA REPAIR WITH MESH;  Surgeon: Greer Pickerel, MD;  [facility];  Service: General;  Laterality: N/A;   Past Surgical History:  Procedure Laterality  Date   ANKLE FRACTURE SURGERY     INSERTION OF MESH N/A 09/26/2016   Procedure: INSERTION OF MESH;  Surgeon: Greer Pickerel, MD;  [facility];  Service: General;  Laterality: N/A;   UMBILICAL HERNIA REPAIR N/A 09/26/2016   Procedure: LAPAROSCOPIC ASSISTED UMBILICAL HERNIA REPAIR WITH MESH;  Surgeon: Greer Pickerel, MD;  [facility];  Service: General;  Laterality: N/A;   Past Medical History:  Diagnosis Date   Arthritis    Hypertension    BP 129/82   Pulse 71   Ht _0  (1.651 m)   Wt 233 lb 9.6 oz (106 kg)   SpO2 98%   BMI 38.87 kg/m   Opioid Risk Score:   Fall Risk Score:  `1  Depression screen [facility] 2/9     01/04/2022    9:21 AM 12/14/2021   12:59 PM 12/07/2021   11:10 AM 11/15/2021    8:16 AM 09/02/2021   11:28 AM 12/04/2020   11:02 AM 11/18/2019    8:56 AM  Depression screen PHQ 2/9  Decreased Interest 0 0 3 0 0 0 0  Down, Depressed, Hopeless 0 0 0 0 0 0 0  PHQ - 2 Score 0 0 3 0 0 0 0  Altered sleeping   2      Tired, decreased energy   3      Change in appetite   0      Feeling bad or failure about yourself    0      Trouble concentrating   1      Moving slowly or fidgety/restless   0      Suicidal thoughts   0      PHQ-9 Score   9         Review of Systems  Constitutional: Negative.   HENT: Negative.    Eyes: Negative.   Respiratory: Negative.    Cardiovascular: Negative.   Gastrointestinal: Negative.   Endocrine: Negative.   Genitourinary: Negative.   Musculoskeletal:  Positive for gait problem.       Left foot pain  Skin: Negative.   Allergic/Immunologic: Negative.   Hematological: Negative.   Psychiatric/Behavioral: Negative.    All other systems reviewed and are negative.     Objective:   Physical Exam  Gen: no distress, normal appearing HEENT: oral mucosa pink and moist, NCAT Cardio: Reg rate Chest: normal effort, normal rate of breathing Abd: soft, non-distended Ext: no edema, pedal pulses intact Psych: pleasant, normal affect Skin:  intact Neuro: Alert and oriented CN 2-12 grossly intact SLR negative Musculoskeletal: 5/5 in b/l UE and RLE,  4+/5 in proximal LLE limited by pain. Minimal ROM at L ankle No paraspinal tenderness L foot with well healed surgical incisions on anterior and lateral ankle Mild left ankle swelling Greatest tenderness at medial ankle Minimal tenderness over peroneal tendons Mild tenderness posterior ankle around Achilles tendon and gastrocs Neg tinels at tarsal tunnel at ankle Antalgic gait walks with a cane DP and PT pulses intact  Recent Results (from the past 2160 hour(s))  CBC with Differential/Platelet     Status: None   Collection Time: 12/14/21  1:38 PM  Result Value Ref Range   WBC 10.0 4.0 - 10.5 K/uL   RBC 4.75 4.22 - 5.81 Mil/uL   Hemoglobin 13.9 13.0 - 17.0 g/dL   HCT 43.4 39.0 - 52.0 %   MCV 91.3 78.0 - 100.0 fl   MCHC 32.0 30.0 - 36.0 g/dL   RDW 14.2 11.5 - 15.5 %   Platelets 216.0 150.0 - 400.0 K/uL   Neutrophils Relative % 61.1 43.0 - 77.0 %   Lymphocytes Relative 27.4 12.0 - 46.0 %   Monocytes Relative 7.3 3.0 - 12.0 %   Eosinophils Relative 3.4 0.0 - 5.0 %   Basophils Relative 0.8 0.0 - 3.0 %   Neutro Abs 6.1 1.4 - 7.7 K/uL   Lymphs Abs 2.7 0.7 - 4.0 K/uL   Monocytes Absolute 0.7 0.1 - 1.0 K/uL   Eosinophils Absolute 0.3 0.0 - 0.7 K/uL   Basophils Absolute 0.1 0.0 - 0.1 K/uL  Comp Met (CMET)     Status: Abnormal   Collection Time: 12/14/21  1:38 PM  Result Value Ref Range   Sodium 138 135 - 145 mEq/L   Potassium 4.2 3.5 - 5.1 mEq/L   Chloride 102 96 - 112 mEq/L   CO2 27 19 - 32 mEq/L   Glucose, Bld 88 70 - 99 mg/dL   BUN 14 6 - 23 mg/dL   Creatinine, Ser 0.88 0.40 - 1.50 mg/dL   Total Bilirubin 0.3 0.2 - 1.2 mg/dL   Alkaline Phosphatase 46 39 - 117 U/L   AST 25 0 - 37 U/L   ALT 32 0 - 53 U/L   Total Protein 5.7 (L) 6.0 - 8.3 g/dL   Albumin 3.7 3.5 - 5.2 g/dL   GFR 93.84 >60.00 mL/min    Comment: Calculated using the CKD-EPI Creatinine Equation (2021)    Calcium 9.2 8.4 - 10.5 mg/dL  Lipid panel     Status: Abnormal   Collection Time: 12/14/21  1:38 PM  Result Value Ref Range   Cholesterol 149 0 - 200 mg/dL    Comment: ATP III Classification       Desirable:  < 200 mg/dL               Borderline High:  200 - 239 mg/dL          High:  > = 240 mg/dL   Triglycerides 157.0 (H) 0.0 - 149.0 mg/dL    Comment: Normal:  <150 mg/dLBorderline High:  150 - 199 mg/dL   HDL 46.90 >39.00 mg/dL   VLDL 31.4 0.0 - 40.0 mg/dL   LDL Cholesterol 71 0 - 99 mg/dL   Total CHOL/HDL Ratio 3     Comment:                Men          Women1/2 Average Risk     3.4          3.3Average Risk  5.0          4.42X Average Risk          9.6          7.13X Average Risk          15.0          11.0                       NonHDL 101.96     Comment: NOTE:  Non-HDL goal should be 30 mg/dL higher than patient's LDL goal (i.e. LDL goal of < 70 mg/dL, would have non-HDL goal of < 100 mg/dL)  PSA     Status: None   Collection Time: 12/14/21  1:38 PM  Result Value Ref Range   PSA 0.28 0.10 - 4.00 ng/mL    Comment: Test performed using Access Hybritech PSA Assay, a parmagnetic partical, chemiluminecent immunoassay.  Hemoglobin A1c     Status: None   Collection Time: 12/14/21  1:38 PM  Result Value Ref Range   Hgb A1c MFr Bld 6.1 4.6 - 6.5 %    Comment: Glycemic Control Guidelines for People with Diabetes:Non Diabetic:  <6%Goal of Therapy: <7%Additional Action Suggested:  >8%   Microalbumin / creatinine urine ratio     Status: None   Collection Time: 12/14/21  1:47 PM  Result Value Ref Range   Microalb, Ur <0.7 0.0 - 1.9 mg/dL   Creatinine,U 108.7 mg/dL   Microalb Creat Ratio 0.6 0.0 - 30.0 mg/g       Assessment & Plan:  Chronic L ankle pain after ankle fracture and multiple surgeries -s/p ORIF and later ankle arthrodesis, seen by podiatry without any further procedural options -Reports poor results with multiple other medications and treatments             -He was  unable to tolerate duloxetine              -Voltaren gel ordered  -Urine drug screen and pain contract completed  -Consider oxycodone 45m BID prn after UDS completed, discussed risks and benefits of this medication, discussed importance of taking it as prescribed, patient has stool softeners available to prevent constipation             -Consider repeat xray at later visit   DM type 2, A1C 6.1 -Continue to discuss importance of glucose control

## 2022-01-07 ENCOUNTER — Telehealth: Payer: Self-pay | Admitting: *Deleted

## 2022-01-07 LAB — TOXASSURE SELECT,+ANTIDEPR,UR

## 2022-01-07 NOTE — Telephone Encounter (Signed)
Urine drug screen for this encounter is consistent for no prescribed medication or unprescribed medication present< however it is positive for alcohol.

## 2022-01-10 ENCOUNTER — Telehealth: Payer: Self-pay

## 2022-01-10 NOTE — Telephone Encounter (Signed)
Brandon Reyes has requested a  "self " dosage increase of Oxycodone 5 MG. Taking the Oxycodone 5 MG twice daily is not helping his pain. Patient reported taking 2 tabs 3 times a day, for a total of 6 tabs daily.    He has requested a call back to discuss the issue with you.    Call back phone [phone].

## 2022-01-18 DIAGNOSIS — Z1152 Encounter for screening for COVID-19: Secondary | ICD-10-CM | POA: Diagnosis not present

## 2022-01-27 ENCOUNTER — Other Ambulatory Visit: Payer: Self-pay | Admitting: Family

## 2022-01-27 DIAGNOSIS — E782 Mixed hyperlipidemia: Secondary | ICD-10-CM

## 2022-01-27 DIAGNOSIS — I10 Essential (primary) hypertension: Secondary | ICD-10-CM

## 2022-02-04 ENCOUNTER — Telehealth: Payer: Self-pay

## 2022-02-04 ENCOUNTER — Encounter
Payer: No Typology Code available for payment source | Attending: Physical Medicine & Rehabilitation | Admitting: Physical Medicine & Rehabilitation

## 2022-02-04 VITALS — BP 105/71 | HR 84 | Ht 65.0 in | Wt 233.4 lb

## 2022-02-04 DIAGNOSIS — M25572 Pain in left ankle and joints of left foot: Secondary | ICD-10-CM | POA: Insufficient documentation

## 2022-02-04 DIAGNOSIS — G8929 Other chronic pain: Secondary | ICD-10-CM | POA: Insufficient documentation

## 2022-02-04 DIAGNOSIS — Z789 Other specified health status: Secondary | ICD-10-CM | POA: Diagnosis not present

## 2022-02-04 DIAGNOSIS — Z79891 Long term (current) use of opiate analgesic: Secondary | ICD-10-CM | POA: Diagnosis not present

## 2022-02-04 DIAGNOSIS — Z5181 Encounter for therapeutic drug level monitoring: Secondary | ICD-10-CM | POA: Diagnosis not present

## 2022-02-04 DIAGNOSIS — Z1152 Encounter for screening for COVID-19: Secondary | ICD-10-CM | POA: Diagnosis not present

## 2022-02-04 DIAGNOSIS — G894 Chronic pain syndrome: Secondary | ICD-10-CM | POA: Insufficient documentation

## 2022-02-04 MED ORDER — OXYCODONE HCL 10 MG PO TABS
10.0000 mg | ORAL_TABLET | Freq: Three times a day (TID) | ORAL | 0 refills | Status: DC | PRN
Start: 2022-02-04 — End: 2022-03-08

## 2022-02-04 MED ORDER — OXYCODONE-ACETAMINOPHEN 10-325 MG PO TABS: 1.0000 | ORAL_TABLET | Freq: Three times a day (TID) | ORAL | 0 refills | Status: DC | PRN

## 2022-02-04 NOTE — Telephone Encounter (Signed)
Patient called and stated the wrong medication was sent in. He is allergic to Tylenol. He needs the regular Oxycodone sent in to the [facility]

## 2022-02-04 NOTE — Progress Notes (Unsigned)
Subjective:    Patient ID: Brandon Reyes, male    DOB: 1960/12/26, 61 y.o.   MRN: [id number]  HPI   61 year old male here with PMH of type 2 DM, HTN here for chronic left ankle pain.  He broke his ankle in 2015 when he stepped off a curb and had ORIF.   He reports he had 2 other surgeries after this including ankle arthrodesis by Dr. Nicki Reaper at [facility]. He continues to have severe pain since this time. Pain is constant but worsened by walking. He walks with a cane.  Pain is sharp and achy.  Ankle brace did not help his pain.  He had ankle injections without benefit. Patient reports he takes tylenol with minimal benefit. Reports percocet helped his pain in the past. When asked if he took lyrica, gabapentin his he wasn't certain but his partner thought he used these without benefit. She said he tried tramadol and it didn't help. Lidocaine cream and patch didn't help the pain. Has not tried TENS unit but has access to TENS.     Interval History 61 year old male with past medical history of type 2 diabetes and hypertension here for follow-up of his left ankle pain.  Patient reports duloxetine made him dizzy and he was unable to tolerate this medication.  Pain continues to be constant and severe in his ankle.  He reports it woke him up from sleep last night.  It is interfering with his function.  He reports hydrocodone did not improve the pain when he used it in the past.  Oral NSAIDs provided minimal benefit in the past.  He walks with a cane to help improve the pain.  Patient reports allergy to Tylenol.   Interval history Brandon Reyes is here for follow-up of his chronic left ankle pain.  He reports since we spoke he has been taking medications as directed.  He is using Tablet twice a day.  The medication is providing mild benefit to his ankle pain but not really keeping it under control to where he can tolerate his activities.  When he used 10 mg this provided enough relief of his pain so he could be more  active at home, sleep better.   Pain Inventory Average Pain 9 Pain Right Now 8 My pain is sharp, stabbing, and aching  In the last 24 hours, has pain interfered with the following? General activity 1 Relation with others 1 Enjoyment of life 1 What TIME of day is your pain at its worst? morning , evening, and night Sleep (in general) Poor  Pain is worse with: walking and standing Pain improves with: medication Relief from Meds: 8  Family History  Problem Relation Age of Onset   Hypertension Mother    Diabetes Mother    Hypertension Sister    Hypertension Maternal Grandmother    Hypertension Sister    Social History   Socioeconomic History   Marital status: Unknown    Spouse name: Not on file   Number of children: 0   Years of education: 12   Highest education level: Not on file  Occupational History   Occupation: Unemployed  Tobacco Use   Smoking status: Former    Types: Cigarettes   Smokeless tobacco: Never  Vaping Use   Vaping Use: Never used  Substance and Sexual Activity   Alcohol use: Not Currently   Drug use: Not Currently   Sexual activity: Not on file  Other Topics Concern   Not on file  Social History Narrative   ** Merged History Encounter **       Fun/Hobby - Travel    Social Determinants of Health   Financial Resource Strain: Low Risk  (11/15/2021)   Overall Financial Resource Strain (CARDIA)    Difficulty of Paying Living Expenses: Not hard at all  Food Insecurity: No Food Insecurity (11/15/2021)   Hunger Vital Sign    Worried About Running Out of Food in the Last Year: Never true    Ran Out of Food in the Last Year: Never true  Transportation Needs: No Transportation Needs (11/15/2021)   PRAPARE - Hydrologist (Medical): No    Lack of Transportation (Non-Medical): No  Physical Activity: Inactive (11/15/2021)   Exercise Vital Sign    Days of Exercise per Week: 0 days    Minutes of Exercise per Session: 0 min   Stress: No Stress Concern Present (11/15/2021)   [place]    Feeling of Stress : Not at all  Social Connections: Not on file   Past Surgical History:  Procedure Laterality Date   [place] N/A 09/26/2016   Procedure: INSERTION OF MESH;  Surgeon: Greer Pickerel, MD;  Location: [facility];  Service: General;  Laterality: N/A;   UMBILICAL HERNIA REPAIR N/A 09/26/2016   Procedure: LAPAROSCOPIC ASSISTED UMBILICAL HERNIA REPAIR WITH MESH;  Surgeon: Greer Pickerel, MD;  [facility]: [facility];  Service: General;  Laterality: N/A;   Past Surgical History:  Procedure Laterality Date   ANKLE FRACTURE SURGERY     INSERTION OF MESH N/A 09/26/2016   Procedure: INSERTION OF MESH;  Surgeon: Greer Pickerel, MD;  [facility];  Service: General;  Laterality: N/A;   UMBILICAL HERNIA REPAIR N/A 09/26/2016   Procedure: LAPAROSCOPIC ASSISTED UMBILICAL HERNIA REPAIR WITH MESH;  Surgeon: Greer Pickerel, MD;  [facility];  Service: General;  Laterality: N/A;   Past Medical History:  Diagnosis Date   Arthritis    Hypertension    BP 105/71   Pulse 84   Ht '5\' 5"'$  (1.651 m)   Wt 233 lb 6.4 oz (105.9 kg)   SpO2 98%   BMI 38.84 kg/m   Opioid Risk Score:   Fall Risk Score:  `1  Depression screen [facility] 2/9     02/04/2022   10:26 AM 01/04/2022    9:21 AM 12/14/2021   12:59 PM 12/07/2021   11:10 AM 11/15/2021    8:16 AM 09/02/2021   11:28 AM 12/04/2020   11:02 AM  Depression screen PHQ 2/9  Decreased Interest 0 0 0 3 0 0 0  Down, Depressed, Hopeless 0 0 0 0 0 0 0  PHQ - 2 Score 0 0 0 3 0 0 0  Altered sleeping    2     Tired, decreased energy    3     Change in appetite    0     Feeling bad or failure about yourself     0     Trouble concentrating    1     Moving slowly or fidgety/restless    0     Suicidal thoughts    0     PHQ-9 Score    9        Review of Systems  Musculoskeletal:        Left foot pain  All other  systems reviewed and are  negative.     Objective:   Physical Exam  Gen: no distress, normal appearing HEENT: oral mucosa pink and moist, NCAT Cardio: Reg rate Chest: normal effort, normal rate of breathing Abd: soft, non-distended Ext: no edema, pedal pulses intact Psych: pleasant, normal affect Skin: intact Neuro: Alert and oriented CN 2-12 grossly intact Musculoskeletal: 5/5 in b/l UE and RLE, 4+/5 in proximal LLE limited by pain. Minimal ROM at L ankle He continues to have left ankle swelling Greatest tenderness at medial ankle Antalgic gait walks with a cane Peripheral pulses intact        Assessment & Plan:   Chronic L ankle pain after ankle fracture and multiple surgeries -s/p ORIF and later ankle arthrodesis, seen by podiatry without any further procedural options -Reports poor results with multiple other medications and treatments             -He was unable to tolerate duloxetine  -Repeat urine drug screen today             -Will increase oxycodone to 10 mg twice daily   -Discussed importance of using medications as directed  Alcohol use -Patient had metabolites of alcohol on prior UDS , will repeat UDS today, I called him previously that he should not be using alcohol with these medications

## 2022-02-04 NOTE — Telephone Encounter (Signed)
PA for Oxycodone-APAP sent to insurance through CoverMyMeds ?

## 2022-02-04 NOTE — Telephone Encounter (Addendum)
Your request has been approved 11/06/21-02/04/23

## 2022-02-06 ENCOUNTER — Encounter: Payer: Self-pay | Admitting: Physical Medicine & Rehabilitation

## 2022-02-09 LAB — TOXASSURE SELECT,+ANTIDEPR,UR

## 2022-02-14 ENCOUNTER — Telehealth: Payer: Self-pay | Admitting: *Deleted

## 2022-02-14 NOTE — Telephone Encounter (Signed)
Urine drug screen for this encounter is consistent for prescribed medication 

## 2022-03-04 DIAGNOSIS — Z1152 Encounter for screening for COVID-19: Secondary | ICD-10-CM | POA: Diagnosis not present

## 2022-03-07 ENCOUNTER — Telehealth: Payer: Self-pay

## 2022-03-07 NOTE — Telephone Encounter (Unsigned)
Patient is requesting refill on his oxycodone  Filled  Written  ID  Drug  QTY  Days  Prescriber  RX #  Dispenser  Refill  Daily Dose*  Pymt Type  PMP  02/04/2022 02/04/2022 1  Oxycodone Hcl (Ir) 10 Mg Tab 60.00 [address] [phone] [id number] 0/0 45.00 MME Medicare [place]

## 2022-03-08 MED ORDER — OXYCODONE HCL 10 MG PO TABS: 10.0000 mg | ORAL_TABLET | Freq: Two times a day (BID) | ORAL | 0 refills | Status: DC | PRN

## 2022-03-08 NOTE — Telephone Encounter (Signed)
Patient notified of medication sent to pharmacy

## 2022-03-08 NOTE — Telephone Encounter (Signed)
Patient is calling again for a refill on Oxycodone. Per PMP, last fill was 02/04/22

## 2022-03-25 ENCOUNTER — Encounter: Payer: Self-pay | Admitting: Physical Medicine & Rehabilitation

## 2022-03-25 ENCOUNTER — Encounter
Payer: No Typology Code available for payment source | Attending: Physical Medicine & Rehabilitation | Admitting: Physical Medicine & Rehabilitation

## 2022-03-25 VITALS — BP 119/82 | HR 78 | Ht 65.0 in | Wt 227.0 lb

## 2022-03-25 DIAGNOSIS — M25572 Pain in left ankle and joints of left foot: Secondary | ICD-10-CM | POA: Insufficient documentation

## 2022-03-25 DIAGNOSIS — G8929 Other chronic pain: Secondary | ICD-10-CM | POA: Insufficient documentation

## 2022-03-25 DIAGNOSIS — Z789 Other specified health status: Secondary | ICD-10-CM | POA: Diagnosis not present

## 2022-03-25 MED ORDER — OXYCODONE HCL 10 MG PO TABS: 10.0000 mg | ORAL_TABLET | Freq: Three times a day (TID) | ORAL | 0 refills | Status: AC | PRN

## 2022-03-25 NOTE — Progress Notes (Unsigned)
Subjective:    Patient ID: Brandon Reyes, male    DOB: July 15, 1961, 61 y.o.   MRN: [id number]  HPI HPI 01/04/22 61 year old male here with PMH of type 2 DM, HTN here for chronic left ankle pain.  He broke his ankle in 2015 when he stepped off a curb and had ORIF.   He reports he had 2 other surgeries after this including ankle arthrodesis by Dr. Nicki Reaper at [facility]. He continues to have severe pain since this time. Pain is constant but worsened by walking. He walks with a cane.  Pain is sharp and achy.  Ankle brace did not help his pain.  He had ankle injections without benefit. Patient reports he takes tylenol with minimal benefit. Reports percocet helped his pain in the past. When asked if he took lyrica, gabapentin his he wasn't certain but his partner thought he used these without benefit. She said he tried tramadol and it didn't help. Lidocaine cream and patch didn't help the pain. Has not tried TENS unit but has access to TENS.  Interval History Patient is here for follow-up of his chronic left ankle pain.  Patient reports he is doing much better and the pain is much better controlled with oxycodone 10 mg.  Patient reports he is much more active since starting this medication.  He is able to take walks.  He is planning a trip to [place] from September 1 to September 10.  Patient does report that the oxycodone only last about 5 to 6 hours thus leaving him in severe pain between treatments and at night for several hours.  He is not having any side effects with medication.      Pain Inventory Average Pain 7 Pain Right Now 6 My pain is intermittent, constant, sharp, and stabbing  In the last 24 hours, has pain interfered with the following? General activity 2 Relation with others 2 Enjoyment of life 2 What TIME of day is your pain at its worst? morning , evening, and night Sleep (in general) Fair  Pain is worse with: walking, standing, and some activites Pain improves with:  medication Relief from Meds:  fair  Family History  Problem Relation Age of Onset   Hypertension Mother    Diabetes Mother    Hypertension Sister    Hypertension Maternal Grandmother    Hypertension Sister    Social History   Socioeconomic History   Marital status: Unknown    Spouse name: Not on file   Number of children: 0   Years of education: 12   Highest education level: Not on file  Occupational History   Occupation: Unemployed  Tobacco Use   Smoking status: Former    Types: Cigarettes   Smokeless tobacco: Never  Scientific laboratory technician Use: Never used  Substance and Sexual Activity   Alcohol use: Not Currently   Drug use: Not Currently   Sexual activity: Not on file  Other Topics Concern   Not on file  Social History Narrative   ** Merged History Encounter **       Fun/Hobby - Travel    Social Determinants of Health   Financial Resource Strain: Low Risk  (11/15/2021)   Overall Financial Resource Strain (CARDIA)    Difficulty of Paying Living Expenses: Not hard at all  Food Insecurity: No Food Insecurity (11/15/2021)   Hunger Vital Sign    Worried About Running Out of Food in the Last Year: Never true  Ran Out of Food in the Last Year: Never true  Transportation Needs: No Transportation Needs (11/15/2021)   PRAPARE - Hydrologist (Medical): No    Lack of Transportation (Non-Medical): No  Physical Activity: Inactive (11/15/2021)   Exercise Vital Sign    Days of Exercise per Week: 0 days    Minutes of Exercise per Session: 0 min  Stress: No Stress Concern Present (11/15/2021)   [place]    Feeling of Stress : Not at all  Social Connections: Not on file   Past Surgical History:  Procedure Laterality Date   [place] N/A 09/26/2016   Procedure: INSERTION OF MESH;  Surgeon: Greer Pickerel, MD;  [facility]: [facility];  Service: General;   Laterality: N/A;   UMBILICAL HERNIA REPAIR N/A 09/26/2016   Procedure: LAPAROSCOPIC ASSISTED UMBILICAL HERNIA REPAIR WITH MESH;  Surgeon: Greer Pickerel, MD;  [facility];  Service: General;  Laterality: N/A;   Past Surgical History:  Procedure Laterality Date   ANKLE FRACTURE SURGERY     INSERTION OF MESH N/A 09/26/2016   Procedure: INSERTION OF MESH;  Surgeon: Greer Pickerel, MD;  [facility];  Service: General;  Laterality: N/A;   UMBILICAL HERNIA REPAIR N/A 09/26/2016   Procedure: LAPAROSCOPIC ASSISTED UMBILICAL HERNIA REPAIR WITH MESH;  Surgeon: Greer Pickerel, MD;  [facility];  Service: General;  Laterality: N/A;   Past Medical History:  Diagnosis Date   Arthritis    Hypertension    BP 119/82   Pulse 78   Ht '5\' 5"'$  (1.651 m)   Wt 227 lb (103 kg)   SpO2 96%   BMI 37.77 kg/m   Opioid Risk Score:   Fall Risk Score:  `1  Depression screen [facility] 2/9     03/25/2022    2:49 PM 02/04/2022   10:26 AM 01/04/2022    9:21 AM 12/14/2021   12:59 PM 12/07/2021   11:10 AM 11/15/2021    8:16 AM 09/02/2021   11:28 AM  Depression screen PHQ 2/9  Decreased Interest 0 0 0 0 3 0 0  Down, Depressed, Hopeless 0 0 0 0 0 0 0  PHQ - 2 Score 0 0 0 0 3 0 0  Altered sleeping     2    Tired, decreased energy     3    Change in appetite     0    Feeling bad or failure about yourself      0    Trouble concentrating     1    Moving slowly or fidgety/restless     0    Suicidal thoughts     0    PHQ-9 Score     9      Review of Systems  Constitutional:  Negative for diaphoresis.  Musculoskeletal:        Left ankle pain  All other systems reviewed and are negative.      Objective:   Physical Exam   Gen: no distress, normal appearing HEENT: oral mucosa pink and moist, NCAT Cardio: Reg rate Chest: normal effort, normal rate of breathing Abd: soft, non-distended Ext: no edema, pedal pulses intact Psych: pleasant, normal affect Skin: intact Neuro: Alert and oriented CN 2-12 grossly  intact Musculoskeletal: 5/5 in b/l UE and RLE, 5/5 in proximal LLE . Minimal ROM at L ankle No paraspinal tenderness L foot  with well healed surgical incisions on anterior and lateral ankle Mild left ankle swelling Mild tenderness at medial and lateral ankle, mild tenderness with ankle inversion and eversion  Antalgic gait walks with a cane, however improved from prior visits DP and PT pulses intact     Assessment & Plan:   Chronic L ankle pain after ankle fracture and multiple surgeries -s/p ORIF and later ankle arthrodesis, seen by podiatry without any further procedural options -Reports poor results with multiple other medications and treatments             -He was unable to tolerate duloxetine  -Repeat urine drug screen today             -Continue oxycodone 10 mg, will increase frequency to up to 3 times daily, discussed taking oxycodone only twice a day on days when pain is not so severe              -Patient due for refill on 9/8 however he will be in [place] at this time.  Will allow early refill at this time due to the situation.  However discussed that we will not plan to change schedule of his subsequent refill for and will plan for this refill on 10/8.   Alcohol use history -Counseling to abstain from alcohol and any illicit drugs

## 2022-03-27 ENCOUNTER — Encounter: Payer: Self-pay | Admitting: Physical Medicine & Rehabilitation

## 2022-03-28 DIAGNOSIS — Z1152 Encounter for screening for COVID-19: Secondary | ICD-10-CM | POA: Diagnosis not present

## 2022-03-30 ENCOUNTER — Other Ambulatory Visit: Payer: Self-pay

## 2022-03-30 ENCOUNTER — Encounter: Payer: Self-pay | Admitting: *Deleted

## 2022-03-30 ENCOUNTER — Telehealth: Payer: Self-pay

## 2022-03-30 DIAGNOSIS — G894 Chronic pain syndrome: Secondary | ICD-10-CM

## 2022-03-30 DIAGNOSIS — Z79891 Long term (current) use of opiate analgesic: Secondary | ICD-10-CM

## 2022-03-30 DIAGNOSIS — Z5181 Encounter for therapeutic drug level monitoring: Secondary | ICD-10-CM

## 2022-03-30 NOTE — Progress Notes (Signed)
error 

## 2022-03-30 NOTE — Telephone Encounter (Deleted)
Brandon Reyes is calling for a refill on his oxycodone. Last fill date was 03/08/22. He is calling a week early.

## 2022-03-30 NOTE — Telephone Encounter (Signed)
Opened in error

## 2022-03-30 NOTE — Telephone Encounter (Signed)
Brandon Reyes is going out of town on Friday. He would like the Oxycodone 10 MG refilled ASAP. He is aware Dr. Ane Payment is not in the office today. However the message will be sent for review when he returns.   Filled  Written  ID  Drug  QTY  Days  Prescriber  RX #  Dispenser  Refill  Daily Dose*  Pymt Type  PMP  03/08/2022 03/08/2022 1  Oxycodone Hcl (Ir) 10 Mg Tab 60.00 Louise [id number] [facility] [phone]) 0/0 30.00 MME Medicare [place] 02/04/2022 02/04/2022 1  Oxycodone Hcl (Ir) 10 Mg Tab 60.00 20 [name] [id number] Wal [phone]) 0/0 45.00 MME Medicare [place] 01/04/2022 01/04/2022 1  Oxycodone Hcl (Ir) 5 Mg Tablet 60.00 30 Yu Sht [id number] Wal ([id number]) 0/0 15.00 MME Comm Ins [place]

## 2022-03-30 NOTE — Addendum Note (Signed)
Addended by: Caro Hight on: 03/30/2022 02:18 PM   Modules accepted: Orders

## 2022-03-31 DIAGNOSIS — Z1152 Encounter for screening for COVID-19: Secondary | ICD-10-CM | POA: Diagnosis not present

## 2022-04-18 ENCOUNTER — Ambulatory Visit: Payer: No Typology Code available for payment source | Admitting: Physical Medicine and Rehabilitation

## 2022-04-22 ENCOUNTER — Other Ambulatory Visit: Payer: Self-pay

## 2022-04-22 DIAGNOSIS — Z1152 Encounter for screening for COVID-19: Secondary | ICD-10-CM | POA: Diagnosis not present

## 2022-04-22 MED ORDER — SILDENAFIL CITRATE 100 MG PO TABS: ORAL_TABLET | ORAL | 3 refills | Status: DC

## 2022-04-22 NOTE — Progress Notes (Signed)
Rx refilled.

## 2022-04-28 DIAGNOSIS — Z1152 Encounter for screening for COVID-19: Secondary | ICD-10-CM | POA: Diagnosis not present

## 2022-05-04 DIAGNOSIS — Z6837 Body mass index (BMI) 37.0-37.9, adult: Secondary | ICD-10-CM | POA: Diagnosis not present

## 2022-05-04 DIAGNOSIS — I10 Essential (primary) hypertension: Secondary | ICD-10-CM | POA: Diagnosis not present

## 2022-05-04 DIAGNOSIS — F1120 Opioid dependence, uncomplicated: Secondary | ICD-10-CM | POA: Diagnosis not present

## 2022-05-04 DIAGNOSIS — R7303 Prediabetes: Secondary | ICD-10-CM | POA: Diagnosis not present

## 2022-05-04 DIAGNOSIS — F1021 Alcohol dependence, in remission: Secondary | ICD-10-CM | POA: Diagnosis not present

## 2022-05-04 DIAGNOSIS — R2681 Unsteadiness on feet: Secondary | ICD-10-CM | POA: Diagnosis not present

## 2022-05-04 DIAGNOSIS — Z008 Encounter for other general examination: Secondary | ICD-10-CM | POA: Diagnosis not present

## 2022-05-04 DIAGNOSIS — E785 Hyperlipidemia, unspecified: Secondary | ICD-10-CM | POA: Diagnosis not present

## 2022-05-06 ENCOUNTER — Telehealth: Payer: Self-pay

## 2022-05-06 DIAGNOSIS — Z1152 Encounter for screening for COVID-19: Secondary | ICD-10-CM | POA: Diagnosis not present

## 2022-05-06 MED ORDER — OXYCODONE HCL 10 MG PO TABS: 10.0000 mg | ORAL_TABLET | Freq: Three times a day (TID) | ORAL | 0 refills | Status: DC | PRN

## 2022-05-06 NOTE — Addendum Note (Signed)
Addended by: Jennye Boroughs on: 05/06/2022 02:12 PM   Modules accepted: Orders

## 2022-05-06 NOTE — Telephone Encounter (Signed)
He has no pain medicine on hand. Need refill of Oxycodone 10 MG.. Please send to [facility] on [facility].  Filled  Written  ID  Drug  QTY  Days  Prescriber  RX #  Dispenser  Refill  Daily Dose*  Pymt Type  PMP  03/30/2022 03/25/2022 1  Oxycodone Hcl (Ir) 10 Mg Tab 90.00 30 [name] [id number] Wal [phone]) 0/0 45.00 MME Medicare [place] 03/08/2022 03/08/2022 1  Oxycodone Hcl (Ir) 10 Mg Tab 60.00 30 Yu Sht [id number] Wal ([id number]) 0/0 30.00 MME Medicare [place]

## 2022-05-12 DIAGNOSIS — Z1152 Encounter for screening for COVID-19: Secondary | ICD-10-CM | POA: Diagnosis not present

## 2022-05-13 ENCOUNTER — Encounter: Payer: Self-pay | Admitting: Physical Medicine & Rehabilitation

## 2022-05-13 ENCOUNTER — Encounter
Payer: No Typology Code available for payment source | Attending: Physical Medicine & Rehabilitation | Admitting: Physical Medicine & Rehabilitation

## 2022-05-13 VITALS — BP 127/87 | HR 76 | Ht 65.0 in | Wt 228.8 lb

## 2022-05-13 DIAGNOSIS — Z789 Other specified health status: Secondary | ICD-10-CM | POA: Diagnosis not present

## 2022-05-13 DIAGNOSIS — G8929 Other chronic pain: Secondary | ICD-10-CM | POA: Diagnosis not present

## 2022-05-13 DIAGNOSIS — M25572 Pain in left ankle and joints of left foot: Secondary | ICD-10-CM | POA: Insufficient documentation

## 2022-05-13 NOTE — Progress Notes (Unsigned)
Subjective:    Patient ID: Brandon Reyes, male    DOB: 1960/12/16, 61 y.o.   MRN: [id number]  HPI HPI 01/04/22 61 year old male here with PMH of type 2 DM, HTN here for chronic left ankle pain.  He broke his ankle in 2015 when he stepped off a curb and had ORIF.   He reports he had 2 other surgeries after this including ankle arthrodesis by Dr. Nicki Reaper at [facility]. He continues to have severe pain since this time. Pain is constant but worsened by walking. He walks with a cane.  Pain is sharp and achy.  Ankle brace did not help his pain.  He had ankle injections without benefit. Patient reports he takes tylenol with minimal benefit. Reports percocet helped his pain in the past. When asked if he took lyrica, gabapentin his he wasn't certain but his partner thought he used these without benefit. She said he tried tramadol and it didn't help. Lidocaine cream and patch didn't help the pain. Has not tried TENS unit but has access to TENS.   Interval History Patient is here for follow-up of his chronic left ankle pain.  Patient reports he is doing much better and the pain is much better controlled with oxycodone 10 mg.  Patient reports he is much more active since starting this medication.  He is able to take walks.  He is planning a trip to [place] from September 1 to September 10.  Patient does report that the oxycodone only last about 5 to 6 hours thus leaving him in severe pain between treatments and at night for several hours.  He is not having any side effects with medication.     Walk farther Exercise- treadmill Sleeping better No constipation No side effects TENS unit has not tried but has  [employer]     Pain Inventory Average Pain 7 Pain Right Now 7 My pain is sharp and aching  In the last 24 hours, has pain interfered with the following? General activity 4 Relation with others 3 Enjoyment of life 3 What TIME of day is your pain at its worst? morning , daytime, and night Sleep (in  general) Fair  Pain is worse with: walking and standing Pain improves with: medication Relief from Meds: 5  Family History  Problem Relation Age of Onset   Hypertension Mother    Diabetes Mother    Hypertension Sister    Hypertension Maternal Grandmother    Hypertension Sister    Social History   Socioeconomic History   Marital status: Unknown    Spouse name: Not on file   Number of children: 0   Years of education: 12   Highest education level: Not on file  Occupational History   Occupation: Unemployed  Tobacco Use   Smoking status: Former    Types: Cigarettes   Smokeless tobacco: Never  Scientific laboratory technician Use: Never used  Substance and Sexual Activity   Alcohol use: Not Currently   Drug use: Not Currently   Sexual activity: Not on file  Other Topics Concern   Not on file  Social History Narrative   ** Merged History Encounter **       Fun/Hobby - Travel    Social Determinants of Health   Financial Resource Strain: Low Risk  (11/15/2021)   Overall Financial Resource Strain (CARDIA)    Difficulty of Paying Living Expenses: Not hard at all  Food Insecurity: No Food Insecurity (11/15/2021)   Hunger  Vital Sign    Worried About Charity fundraiser in the Last Year: Never true    Ran Out of Food in the Last Year: Never true  Transportation Needs: No Transportation Needs (11/15/2021)   PRAPARE - Hydrologist (Medical): No    Lack of Transportation (Non-Medical): No  Physical Activity: Inactive (11/15/2021)   Exercise Vital Sign    Days of Exercise per Week: 0 days    Minutes of Exercise per Session: 0 min  Stress: No Stress Concern Present (11/15/2021)   [place]    Feeling of Stress : Not at all  Social Connections: Not on file   Past Surgical History:  Procedure Laterality Date   [place] N/A 09/26/2016   Procedure: INSERTION OF  MESH;  Surgeon: Greer Pickerel, MD;  [facility];  Service: General;  Laterality: N/A;   UMBILICAL HERNIA REPAIR N/A 09/26/2016   Procedure: LAPAROSCOPIC ASSISTED UMBILICAL HERNIA REPAIR WITH MESH;  Surgeon: Greer Pickerel, MD;  [facility];  Service: General;  Laterality: N/A;   Past Surgical History:  Procedure Laterality Date   ANKLE FRACTURE SURGERY     INSERTION OF MESH N/A 09/26/2016   Procedure: INSERTION OF MESH;  Surgeon: Greer Pickerel, MD;  [facility];  Service: General;  Laterality: N/A;   UMBILICAL HERNIA REPAIR N/A 09/26/2016   Procedure: LAPAROSCOPIC ASSISTED UMBILICAL HERNIA REPAIR WITH MESH;  Surgeon: Greer Pickerel, MD;  [facility];  Service: General;  Laterality: N/A;   Past Medical History:  Diagnosis Date   Arthritis    Hypertension    BP 127/87   Pulse 76   Ht '5\' 5"'$  (1.651 m)   Wt 228 lb 12.8 oz (103.8 kg)   SpO2 96%   BMI 38.07 kg/m   Opioid Risk Score:   Fall Risk Score:  `1  Depression screen PHQ 2/9     05/13/2022    3:00 PM 03/25/2022    2:52 PM 03/25/2022    2:49 PM 02/04/2022   10:26 AM 01/04/2022    9:21 AM 12/14/2021   12:59 PM 12/07/2021   11:10 AM  Depression screen PHQ 2/9  Decreased Interest 0 0 0 0 0 0 3  Down, Depressed, Hopeless 0 0 0 0 0 0 0  PHQ - 2 Score 0 0 0 0 0 0 3  Altered sleeping       2  Tired, decreased energy       3  Change in appetite       0  Feeling bad or failure about yourself        0  Trouble concentrating       1  Moving slowly or fidgety/restless       0  Suicidal thoughts       0  PHQ-9 Score       9     Review of Systems  Constitutional: Negative.   HENT: Negative.    Eyes: Negative.   Respiratory: Negative.    Cardiovascular: Negative.   Gastrointestinal: Negative.   Endocrine: Negative.   Genitourinary: Negative.   Musculoskeletal:        Left foot  Skin: Negative.   Allergic/Immunologic: Negative.   Neurological: Negative.   Hematological: Negative.   Psychiatric/Behavioral: Negative.     All other systems reviewed and are negative.      Objective:  Physical Exam  Gen: no distress, normal appearing HEENT: oral mucosa pink and moist, NCAT Cardio: Reg rate Chest: normal effort, normal rate of breathing Abd: soft, non-distended Ext: no edema, pedal pulses intact Psych: pleasant, normal affect Skin: intact Neuro: Alert and oriented CN 2-12 grossly intact Musculoskeletal: 5/5 in b/l UE and RLE, 5/5 in proximal LLE . Minimal ROM at L ankle Sensation intact all 4 extremities No paraspinal tenderness L foot with well healed surgical incisions on anterior and lateral ankle Mild left ankle swelling, no swelling R ankle Mild tenderness at medial and lateral ankle, mild tenderness with ankle inversion and eversion  Antalgic gait walks with a cane         Assessment & Plan:   Chronic L ankle pain after ankle fracture and multiple surgeries -s/p ORIF and later ankle arthrodesis, seen by podiatry without any further procedural options -Reports poor results with multiple other medications and treatments             -He was unable to tolerate duloxetine              -Continue oxycodone 10 mg TID  -Provided list of antiinflammatory foods to help with pain  -Discussed trying TENS unit          Alcohol use history -Continue to abstain from alcohol and any illicit drugs

## 2022-05-19 DIAGNOSIS — Z1152 Encounter for screening for COVID-19: Secondary | ICD-10-CM | POA: Diagnosis not present

## 2022-05-26 DIAGNOSIS — Z1152 Encounter for screening for COVID-19: Secondary | ICD-10-CM | POA: Diagnosis not present

## 2022-06-03 DIAGNOSIS — Z1152 Encounter for screening for COVID-19: Secondary | ICD-10-CM | POA: Diagnosis not present

## 2022-06-07 ENCOUNTER — Telehealth: Payer: Self-pay

## 2022-06-07 ENCOUNTER — Other Ambulatory Visit: Payer: Self-pay | Admitting: Physical Medicine & Rehabilitation

## 2022-06-07 MED ORDER — OXYCODONE HCL 10 MG PO TABS
10.0000 mg | ORAL_TABLET | Freq: Three times a day (TID) | ORAL | 0 refills | Status: DC | PRN
Start: 2022-06-07 — End: 2022-07-06

## 2022-06-07 NOTE — Telephone Encounter (Signed)
Patient called to request pain medication refill.  Filled  Written  ID  Drug  QTY  Days  Prescriber  RX #  Dispenser  Refill  Daily Dose*  Pymt Type  PMP  05/06/2022 05/06/2022 1  Oxycodone Hcl (Ir) 10 Mg Tab 90.00 30 [name] [id number] Wal [phone]) 0/0 45.00 MME Medicare [place] 03/30/2022 03/25/2022 1  Oxycodone Hcl (Ir) 10 Mg Tab 90.00 30 [name] [id number] Wal [phone]) 0/0 45.00 MME Medicare [place] 03/08/2022 03/08/2022 1  Oxycodone Hcl (Ir) 10 Mg Tab 60.00 30 Yu Sht [id number] Wal ([id number]) 0/0 30.00 MME Medicare [place]

## 2022-06-07 NOTE — Telephone Encounter (Signed)
Refilled medication, thanks

## 2022-06-15 DIAGNOSIS — Z1152 Encounter for screening for COVID-19: Secondary | ICD-10-CM | POA: Diagnosis not present

## 2022-06-19 DIAGNOSIS — Z1152 Encounter for screening for COVID-19: Secondary | ICD-10-CM | POA: Diagnosis not present

## 2022-07-06 ENCOUNTER — Other Ambulatory Visit: Payer: Self-pay

## 2022-07-06 NOTE — Telephone Encounter (Signed)
Pt requesting refill on oxycodone per pmp last fill 06/08/22  Filled  Written  ID  Drug  QTY  Days  Prescriber  RX #  Dispenser  Refill  Daily Dose*  Pymt Type  PMP  05/18/2022 05/18/2022 3  Methylphenidate Er 36 Mg Tab 30.00 30 Za Swa [id number] Nor ([id number]) 0/0  Private Pay [place]

## 2022-07-07 MED ORDER — OXYCODONE HCL 10 MG PO TABS
10.0000 mg | ORAL_TABLET | Freq: Three times a day (TID) | ORAL | 0 refills | Status: DC | PRN
Start: 2022-07-07 — End: 2022-08-04

## 2022-07-12 DIAGNOSIS — Z1152 Encounter for screening for COVID-19: Secondary | ICD-10-CM | POA: Diagnosis not present

## 2022-07-19 DIAGNOSIS — Z1152 Encounter for screening for COVID-19: Secondary | ICD-10-CM | POA: Diagnosis not present

## 2022-07-25 ENCOUNTER — Other Ambulatory Visit: Payer: Self-pay | Admitting: Family

## 2022-07-25 DIAGNOSIS — E782 Mixed hyperlipidemia: Secondary | ICD-10-CM

## 2022-07-25 DIAGNOSIS — I10 Essential (primary) hypertension: Secondary | ICD-10-CM

## 2022-07-30 DIAGNOSIS — Z1152 Encounter for screening for COVID-19: Secondary | ICD-10-CM | POA: Diagnosis not present

## 2022-08-04 ENCOUNTER — Encounter: Payer: Self-pay | Admitting: Physical Medicine & Rehabilitation

## 2022-08-04 ENCOUNTER — Encounter
Payer: No Typology Code available for payment source | Attending: Physical Medicine & Rehabilitation | Admitting: Physical Medicine & Rehabilitation

## 2022-08-04 VITALS — BP 138/83 | HR 72 | Ht 65.0 in | Wt 238.0 lb

## 2022-08-04 DIAGNOSIS — M25572 Pain in left ankle and joints of left foot: Secondary | ICD-10-CM

## 2022-08-04 DIAGNOSIS — Z789 Other specified health status: Secondary | ICD-10-CM | POA: Insufficient documentation

## 2022-08-04 DIAGNOSIS — Z5181 Encounter for therapeutic drug level monitoring: Secondary | ICD-10-CM | POA: Diagnosis not present

## 2022-08-04 DIAGNOSIS — G8929 Other chronic pain: Secondary | ICD-10-CM | POA: Diagnosis not present

## 2022-08-04 DIAGNOSIS — G894 Chronic pain syndrome: Secondary | ICD-10-CM | POA: Diagnosis not present

## 2022-08-04 DIAGNOSIS — Z79891 Long term (current) use of opiate analgesic: Secondary | ICD-10-CM | POA: Diagnosis not present

## 2022-08-04 MED ORDER — OXYCODONE HCL 10 MG PO TABS: 10.0000 mg | ORAL_TABLET | Freq: Three times a day (TID) | ORAL | 0 refills | Status: DC | PRN

## 2022-08-04 NOTE — Progress Notes (Signed)
Subjective:    Patient ID: Brandon Reyes, male    DOB: 07/03/1961, 62 y.o.   MRN: [id number]  HPI HPI 01/04/22 62 year old male here with PMH of type 2 DM, HTN here for chronic left ankle pain.  He broke his ankle in 2015 when he stepped off a curb and had ORIF.   He reports he had 2 other surgeries after this including ankle arthrodesis by Dr. Nicki Reyes at [facility]. He continues to have severe pain since this time. Pain is constant but worsened by walking. He walks with a cane.  Pain is sharp and achy.  Ankle brace did not help his pain.  He had ankle injections without benefit. Patient reports he takes tylenol with minimal benefit. Reports percocet helped his pain in the past. When asked if he took lyrica, gabapentin his he wasn't certain but his partner thought he used these without benefit. She said he tried tramadol and it didn't help. Lidocaine cream and patch didn't help the pain. Has not tried TENS unit but has access to TENS.   Interval History Mr. Brandon Reyes is here to follow-up regarding his chronic pain in his left ankle.  He continues to use oxycodone 10 mg 3 times daily as needed.  He reports this medicine is helping keep his pain under control.  Denies side effects with this medication.  He reports he is able to walk around his house when using the medication.  Allows him to be more active than go to stores and do other such activities with his wife.  Pain Inventory Average Pain 7 Pain Right Now 7 My pain is sharp  In the last 24 hours, has pain interfered with the following? General activity 7 Relation with others 3 Enjoyment of life 4 What TIME of day is your pain at its worst? daytime and night Sleep (in general) Fair  Pain is worse with: walking Pain improves with: rest and medication Relief from Meds: 2  Family History  Problem Relation Age of Onset   Hypertension Mother    Diabetes Mother    Hypertension Sister    Hypertension Maternal Grandmother    Hypertension Sister     Social History   Socioeconomic History   Marital status: Unknown    Spouse name: Not on file   Number of children: 0   Years of education: 12   Highest education level: Not on file  Occupational History   Occupation: Unemployed  Tobacco Use   Smoking status: Former    Types: Cigarettes   Smokeless tobacco: Never  Scientific laboratory technician Use: Never used  Substance and Sexual Activity   Alcohol use: Not Currently   Drug use: Not Currently   Sexual activity: Not on file  Other Topics Concern   Not on file  Social History Narrative   ** Merged History Encounter **       Fun/Hobby - Travel    Social Determinants of Health   Financial Resource Strain: Low Risk  (11/15/2021)   Overall Financial Resource Strain (CARDIA)    Difficulty of Paying Living Expenses: Not hard at all  Food Insecurity: No Food Insecurity (11/15/2021)   Hunger Vital Sign    Worried About Running Out of Food in the Last Year: Never true    [place] in the Last Year: Never true  Transportation Needs: No Transportation Needs (11/15/2021)   PRAPARE - Transportation    Lack of Transportation (Medical): No    Lack  of Transportation (Non-Medical): No  Physical Activity: Inactive (11/15/2021)   Exercise Vital Sign    Days of Exercise per Week: 0 days    Minutes of Exercise per Session: 0 min  Stress: No Stress Concern Present (11/15/2021)   [place]    Feeling of Stress : Not at all  Social Connections: Not on file   Past Surgical History:  Procedure Laterality Date   [place] N/A 09/26/2016   Procedure: INSERTION OF MESH;  Surgeon: Greer Pickerel, MD;  [facility];  Service: General;  Laterality: N/A;   UMBILICAL HERNIA REPAIR N/A 09/26/2016   Procedure: LAPAROSCOPIC ASSISTED UMBILICAL HERNIA REPAIR WITH MESH;  Surgeon: Greer Pickerel, MD;  [facility];  Service: General;  Laterality: N/A;    Past Surgical History:  Procedure Laterality Date   ANKLE FRACTURE SURGERY     INSERTION OF MESH N/A 09/26/2016   Procedure: INSERTION OF MESH;  Surgeon: Greer Pickerel, MD;  [facility];  Service: General;  Laterality: N/A;   UMBILICAL HERNIA REPAIR N/A 09/26/2016   Procedure: LAPAROSCOPIC ASSISTED UMBILICAL HERNIA REPAIR WITH MESH;  Surgeon: Greer Pickerel, MD;  [facility];  Service: General;  Laterality: N/A;   Past Medical History:  Diagnosis Date   Arthritis    Hypertension    BP 138/83   Pulse 72   Ht '5\' 5"'$  (1.651 m)   Wt 238 lb (108 kg)   SpO2 92%   BMI 39.61 kg/m   Opioid Risk Score:   Fall Risk Score:  `1  Depression screen PHQ 2/9     05/13/2022    3:00 PM 03/25/2022    2:52 PM 03/25/2022    2:49 PM 02/04/2022   10:26 AM 01/04/2022    9:21 AM 12/14/2021   12:59 PM 12/07/2021   11:10 AM  Depression screen PHQ 2/9  Decreased Interest 0 0 0 0 0 0 3  Down, Depressed, Hopeless 0 0 0 0 0 0 0  PHQ - 2 Score 0 0 0 0 0 0 3  Altered sleeping       2  Tired, decreased energy       3  Change in appetite       0  Feeling bad or failure about yourself        0  Trouble concentrating       1  Moving slowly or fidgety/restless       0  Suicidal thoughts       0  PHQ-9 Score       9      Review of Systems  All other systems reviewed and are negative.     Objective:   Physical Exam   Gen: no distress, normal appearing HEENT: oral mucosa pink and moist, NCAT Chest: normal effort, normal rate of breathing Abd:  non-distended Psych: pleasant Skin: intact Neuro: Awake and alert CN 2-12 grossly intact, no ataxia noted, follows commands Musculoskeletal: 5/5 in b/l UE and RLE, 5/5 in proximal LLE . Minimal ROM at L ankle Sensation intact all 4 extremities L foot with well healed surgical incisions on anterior and lateral ankle Positive for left ankle swelling, no swelling R ankle Mild tenderness at medial and lateral ankle, mild tenderness with ankle inversion and  eversion  Antalgic gait walks with a cane     Assessment & Plan:  Chronic L ankle pain after ankle  fracture and multiple surgeries -s/p ORIF and later ankle arthrodesis, seen by podiatry without any further procedural options -Reports poor results with multiple other medications and treatments             -He was unable to tolerate duloxetine              -Continue oxycodone 10 mg TID             -Provided list of antiinflammatory foods to help with pain at last visit             -Advised TENS unit  -Repeat UDS today          Alcohol use history -Continue to reinforce abstinence from alcohol and illicit drugs -Repeat routine UDS today

## 2022-08-07 LAB — TOXASSURE SELECT,+ANTIDEPR,UR

## 2022-08-08 ENCOUNTER — Ambulatory Visit: Payer: No Typology Code available for payment source | Admitting: Physical Medicine & Rehabilitation

## 2022-09-01 ENCOUNTER — Telehealth: Payer: Self-pay

## 2022-09-01 MED ORDER — OXYCODONE HCL 10 MG PO TABS: 10.0000 mg | ORAL_TABLET | Freq: Three times a day (TID) | ORAL | 0 refills | Status: DC | PRN

## 2022-09-01 NOTE — Telephone Encounter (Signed)
Mr. Brow will be out of pain medication on tomorrow. Oxycodone 10 MG requested.  Filled  Written  ID  Drug  QTY  Days  Prescriber  RX #  Dispenser  Refill  Daily Dose*  Pymt Type  PMP  08/04/2022 08/04/2022 1  Oxycodone Hcl (Ir) 10 Mg Tab 90.00 30 [name] [id number] Wal [phone]) 0/0 45.00 MME Medicare [place] 07/07/2022 07/07/2022 1  Oxycodone Hcl (Ir) 10 Mg Tab 90.00 30 [address] [id number] Wal ([id number]) 0/0 45.00 MME Medicare [place]

## 2022-09-27 ENCOUNTER — Telehealth: Payer: Self-pay | Admitting: *Deleted

## 2022-09-27 MED ORDER — OXYCODONE HCL 10 MG PO TABS: 10.0000 mg | ORAL_TABLET | Freq: Three times a day (TID) | ORAL | 0 refills | Status: DC | PRN

## 2022-09-27 NOTE — Telephone Encounter (Signed)
Brandon Reyes called to say he will be out of his oxycodone this Friday. Per PMP last fill date was 09/01/2022.

## 2022-09-29 DIAGNOSIS — H52223 Regular astigmatism, bilateral: Secondary | ICD-10-CM | POA: Diagnosis not present

## 2022-09-29 DIAGNOSIS — H524 Presbyopia: Secondary | ICD-10-CM | POA: Diagnosis not present

## 2022-10-07 ENCOUNTER — Encounter
Payer: No Typology Code available for payment source | Attending: Physical Medicine & Rehabilitation | Admitting: Physical Medicine & Rehabilitation

## 2022-10-07 ENCOUNTER — Encounter: Payer: Self-pay | Admitting: Physical Medicine & Rehabilitation

## 2022-10-07 VITALS — BP 114/82 | HR 77 | Ht 65.0 in | Wt 237.0 lb

## 2022-10-07 DIAGNOSIS — G8929 Other chronic pain: Secondary | ICD-10-CM | POA: Diagnosis not present

## 2022-10-07 DIAGNOSIS — Z79891 Long term (current) use of opiate analgesic: Secondary | ICD-10-CM

## 2022-10-07 DIAGNOSIS — Z789 Other specified health status: Secondary | ICD-10-CM

## 2022-10-07 DIAGNOSIS — M25572 Pain in left ankle and joints of left foot: Secondary | ICD-10-CM

## 2022-10-07 DIAGNOSIS — M25571 Pain in right ankle and joints of right foot: Secondary | ICD-10-CM | POA: Diagnosis not present

## 2022-10-07 DIAGNOSIS — G894 Chronic pain syndrome: Secondary | ICD-10-CM

## 2022-10-07 NOTE — Progress Notes (Signed)
Subjective:    Patient ID: Brandon Reyes, male    DOB: 12-12-1960, 62 y.o.   MRN: [id number]  HPI HPI 01/04/22 62 year old male here with PMH of type 2 DM, HTN here for chronic left ankle pain.  He broke his ankle in 2015 when he stepped off a curb and had ORIF.   He reports he had 2 other surgeries after this including ankle arthrodesis by Dr. Lorin Picket at [facility]. He continues to have severe pain since this time. Pain is constant but worsened by walking. He walks with a cane.  Pain is sharp and achy.  Ankle brace did not help his pain.  He had ankle injections without benefit. Patient reports he takes tylenol with minimal benefit. Reports percocet helped his pain in the past. When asked if he took lyrica, gabapentin his he wasn't certain but his partner thought he used these without benefit. She said he tried tramadol and it didn't help. Lidocaine cream and patch didn't help the pain. Has not tried TENS unit but has access to TENS.   Interval History 08/04/2022 Mr. Jude is here to follow-up regarding his chronic pain in his left ankle.  He continues to use oxycodone 10 mg 3 times daily as needed.  He reports this medicine is helping keep his pain under control.  Denies side effects with this medication.  He reports he is able to walk around his house when using the medication.  Allows him to be more active than go to stores and do other such activities with his wife.  Interval history 10/08/2022 Mr. Carle is here for follow-up regarding his chronic ankle pain.  He reports oxycodone continues to help keep his pain tolerable.  No side effects with this medication reported.  He does report he feels like he is having some discomfort in his right ankle as well, not as severe as his left side.  He denies any significant injury to this joint.  Patient is able to walk more and do more chores around the home, go to the store with the oxycodone medication.  Patient would also like to pick upKnee-high compression stockings  for dependent edema in his legs.  Pain Inventory Average Pain 8 Pain Right Now 8 My pain is sharp  In the last 24 hours, has pain interfered with the following? General activity 4 Relation with others 4 Enjoyment of life 4 What TIME of day is your pain at its worst? morning , daytime, and night Sleep (in general) Fair  Pain is worse with: walking and standing Pain improves with: medication Relief from Meds: 4  Family History  Problem Relation Age of Onset   Hypertension Mother    Diabetes Mother    Hypertension Sister    Hypertension Maternal Grandmother    Hypertension Sister    Social History   Socioeconomic History   Marital status: Unknown    Spouse name: Not on file   Number of children: 0   Years of education: 12   Highest education level: Not on file  Occupational History   Occupation: Unemployed  Tobacco Use   Smoking status: Former    Types: Cigarettes   Smokeless tobacco: Never  Building services engineer Use: Never used  Substance and Sexual Activity   Alcohol use: Not Currently   Drug use: Not Currently   Sexual activity: Not on file  Other Topics Concern   Not on file  Social History Narrative   ** Merged History Encounter **  Fun/Hobby - Travel    Social Determinants of Health   Financial Resource Strain: Low Risk  (11/15/2021)   Overall Financial Resource Strain (CARDIA)    Difficulty of Paying Living Expenses: Not hard at all  Food Insecurity: No Food Insecurity (11/15/2021)   Hunger Vital Sign    Worried About Running Out of Food in the Last Year: Never true    Ran Out of Food in the Last Year: Never true  Transportation Needs: No Transportation Needs (11/15/2021)   PRAPARE - Administrator, Civil Service (Medical): No    Lack of Transportation (Non-Medical): No  Physical Activity: Inactive (11/15/2021)   Exercise Vital Sign    Days of Exercise per Week: 0 days    Minutes of Exercise per Session: 0 min  Stress: No Stress  Concern Present (11/15/2021)   [employer] of Occupational Health - Occupational Stress Questionnaire    Feeling of Stress : Not at all  Social Connections: Not on file   Past Surgical History:  Procedure Laterality Date   ANKLE FRACTURE SURGERY     INSERTION OF MESH N/A 09/26/2016   Procedure: INSERTION OF MESH;  Surgeon: Gaynelle Adu, MD;  Location: [facility];  Service: General;  Laterality: N/A;   UMBILICAL HERNIA REPAIR N/A 09/26/2016   Procedure: LAPAROSCOPIC ASSISTED UMBILICAL HERNIA REPAIR WITH MESH;  Surgeon: Gaynelle Adu, MD;  Location: [facility];  Service: General;  Laterality: N/A;   Past Surgical History:  Procedure Laterality Date   ANKLE FRACTURE SURGERY     INSERTION OF MESH N/A 09/26/2016   Procedure: INSERTION OF MESH;  Surgeon: Gaynelle Adu, MD;  Location: [facility];  Service: General;  Laterality: N/A;   UMBILICAL HERNIA REPAIR N/A 09/26/2016   Procedure: LAPAROSCOPIC ASSISTED UMBILICAL HERNIA REPAIR WITH MESH;  Surgeon: Gaynelle Adu, MD;  Location: [facility];  Service: General;  Laterality: N/A;   Past Medical History:  Diagnosis Date   Arthritis    Hypertension    BP 114/82   Pulse 77   Ht 5\' 5"  (1.651 m)   Wt 237 lb (107.5 kg)   SpO2 98%   BMI 39.44 kg/m   Opioid Risk Score:   Fall Risk Score:  `1  Depression screen PHQ 2/9     05/13/2022    3:00 PM 03/25/2022    2:52 PM 03/25/2022    2:49 PM 02/04/2022   10:26 AM 01/04/2022    9:21 AM 12/14/2021   12:59 PM 12/07/2021   11:10 AM  Depression screen PHQ 2/9  Decreased Interest 0 0 0 0 0 0 3  Down, Depressed, Hopeless 0 0 0 0 0 0 0  PHQ - 2 Score 0 0 0 0 0 0 3  Altered sleeping       2  Tired, decreased energy       3  Change in appetite       0  Feeling bad or failure about yourself        0  Trouble concentrating       1  Moving slowly or fidgety/restless       0  Suicidal thoughts       0  PHQ-9 Score       9      Review of Systems  Musculoskeletal:  Positive for gait problem.  All other systems reviewed  and are negative.     Objective:   Physical Exam  Gen: no distress, normal appearing HEENT: oral mucosa pink  and moist, NCAT Chest: normal effort, normal rate of breathing Abd:  non-distended Psych: pleasant Skin: intact Neuro: Awake and alert CN 2-12 grossly intact, no ataxia noted, follows commands Musculoskeletal: 5/5 in b/l UE and RLE, 5/5 in proximal LLE . Minimal ROM at L ankle Sensation intact all 4 extremities L foot with well healed surgical incisions on anterior and lateral ankle Positive for left ankle swelling, no swelling R ankle Slight tenderness both medial and lateral noted around right ankle, slight pain with inversion and eversion, no significant pain with flexion and extension Mild tenderness at medial and lateral ankle, mild tenderness with ankle inversion and eversion  Antalgic gait walks with a cane      Assessment & Plan:   Chronic L ankle pain after ankle fracture and multiple surgeries -s/p ORIF and later ankle arthrodesis, seen by podiatry without any further procedural options -Reports poor results with multiple other medications and treatments             -He was unable to tolerate duloxetine              -Continue oxycodone 10 mg TID             -Advised TENS unit             -Continue monitoring random UDS, pill counts  -Patient reporting some milder pain in his right ankle, reports mostly hurts with ambulation.  Possibly altered gait could be causing some increased degeneration of his joint.  Offered to order x-ray for further evaluation, patient would like to hold off at this time. -Advised patient to call about 4 to 5 days before running out of his medication          Alcohol use history -Continue to reinforce abstinence from alcohol and illicit drugs -UDS last visit appears consistent

## 2022-10-08 ENCOUNTER — Encounter: Payer: Self-pay | Admitting: Physical Medicine & Rehabilitation

## 2022-10-17 ENCOUNTER — Other Ambulatory Visit: Payer: Self-pay | Admitting: Family

## 2022-10-18 ENCOUNTER — Other Ambulatory Visit: Payer: Self-pay | Admitting: Family

## 2022-10-18 MED ORDER — SILDENAFIL CITRATE 100 MG PO TABS: ORAL_TABLET | ORAL | 0 refills | Status: DC

## 2022-10-25 ENCOUNTER — Other Ambulatory Visit: Payer: Self-pay | Admitting: Physical Medicine & Rehabilitation

## 2022-10-25 ENCOUNTER — Other Ambulatory Visit: Payer: Self-pay | Admitting: *Deleted

## 2022-10-25 DIAGNOSIS — I10 Essential (primary) hypertension: Secondary | ICD-10-CM | POA: Diagnosis not present

## 2022-10-25 DIAGNOSIS — Z1211 Encounter for screening for malignant neoplasm of colon: Secondary | ICD-10-CM | POA: Diagnosis not present

## 2022-10-25 DIAGNOSIS — E782 Mixed hyperlipidemia: Secondary | ICD-10-CM | POA: Diagnosis not present

## 2022-10-25 DIAGNOSIS — Z86010 Personal history of colonic polyps: Secondary | ICD-10-CM | POA: Diagnosis not present

## 2022-10-25 MED ORDER — OXYCODONE HCL 10 MG PO TABS: 10.0000 mg | ORAL_TABLET | Freq: Three times a day (TID) | ORAL | 0 refills | Status: DC | PRN

## 2022-10-25 NOTE — Telephone Encounter (Signed)
Patient requesting refill last refill 09/27/22. [place]

## 2022-10-25 NOTE — Telephone Encounter (Signed)
Already ordered

## 2022-11-04 ENCOUNTER — Telehealth: Payer: Self-pay | Admitting: Family

## 2022-11-04 NOTE — Telephone Encounter (Signed)
Contacted Brandon Reyes to schedule their annual wellness visit. Appointment made for 11/18/2022.  Verlee Rossetti; Care Guide Ambulatory Clinical Support [place] [facility] Direct Dial: [phone]

## 2022-11-18 ENCOUNTER — Ambulatory Visit (INDEPENDENT_AMBULATORY_CARE_PROVIDER_SITE_OTHER): Payer: No Typology Code available for payment source

## 2022-11-18 VITALS — Ht 65.0 in | Wt 229.0 lb

## 2022-11-18 DIAGNOSIS — Z0000 Encounter for general adult medical examination without abnormal findings: Secondary | ICD-10-CM

## 2022-11-18 NOTE — Patient Instructions (Signed)
Mr. Brandon Reyes , Thank you for taking time to come for your Medicare Wellness Visit. I appreciate your ongoing commitment to your health goals. Please review the following plan we discussed and let me know if I can assist you in the future.   These are the goals we discussed:  Goals      Patient Stated     11/15/2021, no goals     Patient Stated     To stay healthy        This is a list of the screening recommended for you and due dates:  Health Maintenance  Topic Date Due   Complete foot exam   Never done   Zoster (Shingles) Vaccine (2 of 2) 02/08/2022   COVID-19 Vaccine (4 - 2023-24 season) 04/01/2022   Hemoglobin A1C  06/16/2022   Eye exam for diabetics  09/15/2022   Yearly kidney function blood test for diabetes  12/15/2022   Yearly kidney health urinalysis for diabetes  12/15/2022   Colon Cancer Screening  12/23/2022   Flu Shot  03/02/2023   Medicare Annual Wellness Visit  11/18/2023   DTaP/Tdap/Td vaccine (3 - Td or Tdap) 04/17/2031   Hepatitis C Screening: USPSTF Recommendation to screen - Ages 54-79 yo.  Completed   HIV Screening  Completed   HPV Vaccine  Aged Out    Advanced directives: Patient will pick up form from office for completion   Conditions/risks identified: Keep up the good work  Next appointment: Follow up in one year for your annual wellness visit 11/22/23  Preventive Care 40-64 Years, Male Preventive care refers to lifestyle choices and visits with your health care provider that can promote health and wellness. What does preventive care include? A yearly physical exam. This is also called an annual well check. Dental exams once or twice a year. Routine eye exams. Ask your health care provider how often you should have your eyes checked. Personal lifestyle choices, including: Daily care of your teeth and gums. Regular physical activity. Eating a healthy diet. Avoiding tobacco and drug use. Limiting alcohol use. Practicing safe sex. Taking low-dose  aspirin every day starting at age 70. What happens during an annual well check? The services and screenings done by your health care provider during your annual well check will depend on your age, overall health, lifestyle risk factors, and family history of disease. Counseling  Your health care provider may ask you questions about your: Alcohol use. Tobacco use. Drug use. Emotional well-being. Home and relationship well-being. Sexual activity. Eating habits. Work and work Astronomer. Screening  You may have the following tests or measurements: Height, weight, and BMI. Blood pressure. Lipid and cholesterol levels. These may be checked every 5 years, or more frequently if you are over 15 years old. Skin check. Lung cancer screening. You may have this screening every year starting at age 3 if you have a 30-pack-year history of smoking and currently smoke or have quit within the past 15 years. Fecal occult blood test (FOBT) of the stool. You may have this test every year starting at age 49. Flexible sigmoidoscopy or colonoscopy. You may have a sigmoidoscopy every 5 years or a colonoscopy every 10 years starting at age 12. Prostate cancer screening. Recommendations will vary depending on your family history and other risks. Hepatitis C blood test. Hepatitis B blood test. Sexually transmitted disease (STD) testing. Diabetes screening. This is done by checking your blood sugar (glucose) after you have not eaten for a while (fasting). You may  have this done every 1-3 years. Discuss your test results, treatment options, and if necessary, the need for more tests with your health care provider. Vaccines  Your health care provider may recommend certain vaccines, such as: Influenza vaccine. This is recommended every year. Tetanus, diphtheria, and acellular pertussis (Tdap, Td) vaccine. You may need a Td booster every 10 years. Zoster vaccine. You may need this after age 56. Pneumococcal  13-valent conjugate (PCV13) vaccine. You may need this if you have certain conditions and have not been vaccinated. Pneumococcal polysaccharide (PPSV23) vaccine. You may need one or two doses if you smoke cigarettes or if you have certain conditions. Talk to your health care provider about which screenings and vaccines you need and how often you need them. This information is not intended to replace advice given to you by your health care provider. Make sure you discuss any questions you have with your health care provider. Document Released: 08/14/2015 Document Revised: 04/06/2016 Document Reviewed: 05/19/2015 Elsevier Interactive Patient Education  2017 [employer].  Fall Prevention in the Home Falls can cause injuries. They can happen to people of all ages. There are many things you can do to make your home safe and to help prevent falls. What can I do on the outside of my home? Regularly fix the edges of walkways and driveways and fix any cracks. Remove anything that might make you trip as you walk through a door, such as a raised step or threshold. Trim any bushes or trees on the path to your home. Use bright outdoor lighting. Clear any walking paths of anything that might make someone trip, such as rocks or tools. Regularly check to see if handrails are loose or broken. Make sure that both sides of any steps have handrails. Any raised decks and porches should have guardrails on the edges. Have any leaves, snow, or ice cleared regularly. Use sand or salt on walking paths during winter. Clean up any spills in your garage right away. This includes oil or grease spills. What can I do in the bathroom? Use night lights. Install grab bars by the toilet and in the tub and shower. Do not use towel bars as grab bars. Use non-skid mats or decals in the tub or shower. If you need to sit down in the shower, use a plastic, non-slip stool. Keep the floor dry. Clean up any water that spills on the  floor as soon as it happens. Remove soap buildup in the tub or shower regularly. Attach bath mats securely with double-sided non-slip rug tape. Do not have throw rugs and other things on the floor that can make you trip. What can I do in the bedroom? Use night lights. Make sure that you have a light by your bed that is easy to reach. Do not use any sheets or blankets that are too big for your bed. They should not hang down onto the floor. Have a firm chair that has side arms. You can use this for support while you get dressed. Do not have throw rugs and other things on the floor that can make you trip. What can I do in the kitchen? Clean up any spills right away. Avoid walking on wet floors. Keep items that you use a lot in easy-to-reach places. If you need to reach something above you, use a strong step stool that has a grab bar. Keep electrical cords out of the way. Do not use floor polish or wax that makes floors slippery. If  you must use wax, use non-skid floor wax. Do not have throw rugs and other things on the floor that can make you trip. What can I do with my stairs? Do not leave any items on the stairs. Make sure that there are handrails on both sides of the stairs and use them. Fix handrails that are broken or loose. Make sure that handrails are as long as the stairways. Check any carpeting to make sure that it is firmly attached to the stairs. Fix any carpet that is loose or worn. Avoid having throw rugs at the top or bottom of the stairs. If you do have throw rugs, attach them to the floor with carpet tape. Make sure that you have a light switch at the top of the stairs and the bottom of the stairs. If you do not have them, ask someone to add them for you. What else can I do to help prevent falls? Wear shoes that: Do not have high heels. Have rubber bottoms. Are comfortable and fit you well. Are closed at the toe. Do not wear sandals. If you use a stepladder: Make sure that  it is fully opened. Do not climb a closed stepladder. Make sure that both sides of the stepladder are locked into place. Ask someone to hold it for you, if possible. Clearly mark and make sure that you can see: Any grab bars or handrails. First and last steps. Where the edge of each step is. Use tools that help you move around (mobility aids) if they are needed. These include: Canes. Walkers. Scooters. Crutches. Turn on the lights when you go into a dark area. Replace any light bulbs as soon as they burn out. Set up your furniture so you have a clear path. Avoid moving your furniture around. If any of your floors are uneven, fix them. If there are any pets around you, be aware of where they are. Review your medicines with your doctor. Some medicines can make you feel dizzy. This can increase your chance of falling. Ask your doctor what other things that you can do to help prevent falls. This information is not intended to replace advice given to you by your health care provider. Make sure you discuss any questions you have with your health care provider. Document Released: 05/14/2009 Document Revised: 12/24/2015 Document Reviewed: 08/22/2014 Elsevier Interactive Patient Education  2017 [employer].

## 2022-11-18 NOTE — Progress Notes (Signed)
Subjective:   Brandon Reyes is a 62 y.o. male who presents for Medicare Annual/Subsequent preventive examination.  I connected with  Carmin Muskrat on 11/18/22 by a audio enabled telemedicine application and verified that I am speaking with the correct person using two identifiers.  Patient Location: Home  Provider Location: Home Office  I discussed the limitations of evaluation and management by telemedicine. The patient expressed understanding and agreed to proceed.       Review of Systems    Cardiac Risk Factors include: advanced age (>64men, >58 women);diabetes mellitus;dyslipidemia;hypertension;male gender;obesity (BMI >30kg/m2)     Objective:    Today's Vitals   11/18/22 1535  Weight: 229 lb (103.9 kg)  Height: 5\' 5"  (1.651 m)   Body mass index is 38.11 kg/m.     11/18/2022    3:59 PM 02/04/2022   10:26 AM 12/14/2021    2:11 PM 11/15/2021    8:15 AM 04/16/2021    2:33 PM 06/18/2018   10:05 AM 07/01/2017    1:39 PM  Advanced Directives  Does Patient Have a Medical Advance Directive? No No No Yes No Yes No  Type of Advance Directive    Living will     Does patient want to make changes to medical advance directive?      No - Patient declined   Would patient like information on creating a medical advance directive? No - Patient declined  Yes (MAU/Ambulatory/Procedural Areas - Information given)    No - Patient declined    Current Medications (verified) Outpatient Encounter Medications as of 11/18/2022  Medication Sig   amLODipine (NORVASC) 5 MG tablet TAKE 1 TABLET DAILY   atorvastatin (LIPITOR) 40 MG tablet TAKE 1 TABLET DAILY   Cyanocobalamin 2500 MCG TABS Take 2,500 mcg by mouth every other day. Vitamin B12   metoprolol succinate (TOPROL-XL) 25 MG 24 hr tablet TAKE 1 TABLET DAILY   Omega-3 Fatty Acids (FISH OIL PO) Take by mouth.   Oxycodone HCl 10 MG TABS Take 1 tablet (10 mg total) by mouth 3 (three) times daily as needed.   sildenafil (VIAGRA) 100 MG tablet  TAKE 1 TABLET DAILY AS     NEEDED FOR ERECTILE        DYSFUNCTION   Zoster Vaccine Adjuvanted [facility]) injection Inject into the muscle.   Facility-Administered Encounter Medications as of 11/18/2022  Medication   diclofenac Sodium (VOLTAREN) 1 % topical gel 4 g    Allergies (verified) Duloxetine hcl and Tylenol [acetaminophen]   History: Past Medical History:  Diagnosis Date   Arthritis    Hypertension    Past Surgical History:  Procedure Laterality Date   ANKLE FRACTURE SURGERY     INSERTION OF MESH N/A 09/26/2016   Procedure: INSERTION OF MESH;  Surgeon: Gaynelle Adu, MD;  Location: [facility];  Service: General;  Laterality: N/A;   UMBILICAL HERNIA REPAIR N/A 09/26/2016   Procedure: LAPAROSCOPIC ASSISTED UMBILICAL HERNIA REPAIR WITH MESH;  Surgeon: Gaynelle Adu, MD;  Location: [facility];  Service: General;  Laterality: N/A;   Family History  Problem Relation Age of Onset   Hypertension Mother    Diabetes Mother    Hypertension Sister    Hypertension Maternal Grandmother    Hypertension Sister    Social History   Socioeconomic History   Marital status: Married    Spouse name: Not on file   Number of children: 0   Years of education: 12   Highest education level: Not on file  Occupational History  Occupation: Unemployed  Tobacco Use   Smoking status: Former    Types: Cigarettes   Smokeless tobacco: Never  Building services engineer Use: Never used  Substance and Sexual Activity   Alcohol use: Not Currently   Drug use: Not Currently   Sexual activity: Not on file  Other Topics Concern   Not on file  Social History Narrative    Fun/Hobby - Travel    Lives with Wife   Social Determinants of Health   Financial Resource Strain: Low Risk  (11/15/2021)   Overall Financial Resource Strain (CARDIA)    Difficulty of Paying Living Expenses: Not hard at all  Food Insecurity: No Food Insecurity (11/18/2022)   Hunger Vital Sign    Worried About Running Out of Food in the Last  Year: Never true    Ran Out of Food in the Last Year: Never true  Transportation Needs: No Transportation Needs (11/18/2022)   PRAPARE - Administrator, Civil Service (Medical): No    Lack of Transportation (Non-Medical): No  Physical Activity: Inactive (11/18/2022)   Exercise Vital Sign    Days of Exercise per Week: 0 days    Minutes of Exercise per Session: 0 min  Stress: No Stress Concern Present (11/18/2022)   [employer] of Occupational Health - Occupational Stress Questionnaire    Feeling of Stress : Not at all  Social Connections: Socially Integrated (11/18/2022)   Social Connection and Isolation Panel [NHANES]    Frequency of Communication with Friends and Family: More than three times a week    Frequency of Social Gatherings with Friends and Family: More than three times a week    Attends Religious Services: More than 4 times per year    Active Member of [employer] or Organizations: Yes    Attends Engineer, structural: More than 4 times per year    Marital Status: Married    Tobacco Counseling Counseling given: Not Answered   Clinical Intake:  Pre-visit preparation completed: Yes  Pain : No/denies pain     BMI - recorded: 38.11 Nutritional Status: BMI > 30  Obese Nutritional Risks: None Diabetes: No  How often do you need to have someone help you when you read instructions, pamphlets, or other written materials from your doctor or pharmacy?: 1 - Never  Diabetic?  Yes  Interpreter Needed?: No  Information entered by :: Kandis Cocking, CMA   Activities of Daily Living    11/18/2022    3:59 PM  In your present state of health, do you have any difficulty performing the following activities:  Hearing? 0  Vision? 0  Difficulty concentrating or making decisions? 0  Walking or climbing stairs? 0  Dressing or bathing? 0  Doing errands, shopping? 0  Preparing Food and eating ? N  Using the Toilet? N  In the past six months, have you  accidently leaked urine? N  Do you have problems with loss of bowel control? N  Managing your Medications? N  Managing your Finances? N  Housekeeping or managing your Housekeeping? N    Patient Care Team: Olive Bass, FNP as PCP - General (Internal Medicine) [place] Scrape Allyne Gee, FNP (Internal Medicine)  Indicate any recent Medical Services you may have received from other than [facility] providers in the past year (date may be approximate).     Assessment:   This is a routine wellness examination for [place].  Hearing/Vision screen Hearing Screening - Comments:: Denies hearing difficulties  Vision Screening - Comments:: Wears rx glasses - up to date with routine eye exams with  09/2022, [facility]  Dietary issues and exercise activities discussed:     Goals Addressed             This Visit's Progress    Patient Stated       To stay healthy       Depression Screen    11/18/2022    4:01 PM 05/13/2022    3:00 PM 03/25/2022    2:52 PM 03/25/2022    2:49 PM 02/04/2022   10:26 AM 01/04/2022    9:21 AM 12/14/2021   12:59 PM  PHQ 2/9 Scores  PHQ - 2 Score 0 0 0 0 0 0 0    Fall Risk    11/18/2022    3:37 PM 05/13/2022    2:59 PM 03/25/2022    2:54 PM 03/25/2022    2:49 PM 02/04/2022   10:26 AM  Fall Risk   Falls in the past year? 0 0 0 0 0  Number falls in past yr: 0  0 0   Injury with Fall? 0  0 0   Risk for fall due to : No Fall Risks      Follow up Falls prevention discussed        FALL RISK PREVENTION PERTAINING TO THE HOME:  Any stairs in or around the home? Yes If so, are there any without handrails? No   Home free of loose throw rugs in walkways, pet beds, electrical cords, etc? Yes   Adequate lighting in your home to reduce risk of falls? Yes   ASSISTIVE DEVICES UTILIZED TO PREVENT FALLS:  Life alert? No  Use of a cane, walker or w/c? Yes  Grab bars in the bathroom? No  Shower chair or bench in shower? No  Elevated toilet seat or a  handicapped toilet? No   TIMED UP AND GO:  Was the test performed? No .   Televisit   Cognitive Function:        11/18/2022    4:00 PM 11/15/2021    8:17 AM  6CIT Screen  What Year? 0 points 0 points  What month? 0 points 0 points  What time? 0 points 0 points  Count back from 20 0 points 0 points  Months in reverse 0 points 0 points  Repeat phrase 0 points 4 points  Total Score 0 points 4 points    Immunizations Immunization History  Administered Date(s) Administered   Influenza-Unspecified 06/01/2018   PFIZER(Purple Top)SARS-COV-2 Vaccination 09/30/2019, 10/23/2019   PNEUMOCOCCAL CONJUGATE-20 12/04/2020   Pfizer Covid-19 Vaccine Bivalent Booster 29yrs & up 09/02/2021   Tdap 07/01/2017, 04/16/2021   Zoster Recombinat (Shingrix) 12/14/2021    TDAP status: Up to date  Flu Vaccine status: Up to date  Pneumococcal vaccine status: Up to date  Covid-19 vaccine status: Information provided on how to obtain vaccines.   Qualifies for Shingles Vaccine? Yes   Zostavax completed No   Shingles Complete-No  Screening Tests Health Maintenance  Topic Date Due   FOOT EXAM  Never done   Zoster Vaccines- Shingrix (2 of 2) 02/08/2022   COVID-19 Vaccine (4 - 2023-24 season) 04/01/2022   HEMOGLOBIN A1C  06/16/2022   OPHTHALMOLOGY EXAM  09/15/2022   Diabetic kidney evaluation - eGFR measurement  12/15/2022   Diabetic kidney evaluation - Urine ACR  12/15/2022   COLONOSCOPY (Pts 45-60yrs Insurance coverage will need to be confirmed)  12/23/2022  INFLUENZA VACCINE  03/02/2023   Medicare Annual Wellness (AWV)  11/18/2023   DTaP/Tdap/Td (3 - Td or Tdap) 04/17/2031   Hepatitis C Screening  Completed   HIV Screening  Completed   HPV VACCINES  Aged Out    Health Maintenance  Health Maintenance Due  Topic Date Due   FOOT EXAM  Never done   Zoster Vaccines- Shingrix (2 of 2) 02/08/2022   COVID-19 Vaccine (4 - 2023-24 season) 04/01/2022   HEMOGLOBIN A1C  06/16/2022    OPHTHALMOLOGY EXAM  09/15/2022   Diabetic kidney evaluation - eGFR measurement  12/15/2022   Diabetic kidney evaluation - Urine ACR  12/15/2022    Colorectal cancer screening: Type of screening: Colonoscopy. Completed 12/22/2017    . Repeat every 5 years  Lung Cancer Screening: (Low Dose CT Chest recommended if Age 25-80 years, 30 pack-year currently smoking OR have quit w/in 15years.) does not qualify.   Lung Cancer Screening Referral: N/A  Additional Screening:  Hepatitis C Screening: does qualify; Completed 03/16/2018  Vision Screening: Recommended annual ophthalmology exams for early detection of glaucoma and other disorders of the eye. Is the patient up to date with their annual eye exam?  Yes   Who is the provider or what is the name of the office in which the patient attends annual eye exams? Yes, [facility] in [place]  If pt is not established with a provider, would they like to be referred to a provider to establish care? No .   Dental Screening: Recommended annual dental exams for proper oral hygiene  Community Resource Referral / Chronic Care Management: CRR required this visit?  No   CCM required this visit?  No      Plan:     I have personally reviewed and noted the following in the patient's chart:   Medical and social history Use of alcohol, tobacco or illicit drugs  Current medications and supplements including opioid prescriptions. Patient is currently taking opioid prescriptions. Information provided to patient regarding non-opioid alternatives. Patient advised to discuss non-opioid treatment plan with their provider. Functional ability and status Nutritional status Physical activity Advanced directives List of other physicians Hospitalizations, surgeries, and ER visits in previous 12 months Vitals Screenings to include cognitive, depression, and falls Referrals and appointments  In addition, I have reviewed and discussed with patient certain  preventive protocols, quality metrics, and best practice recommendations. A written personalized care plan for preventive services as well as general preventive health recommendations were provided to patient.     Milus Mallick, CMA   11/18/2022   Nurse Notes:   Discussed Advanced directive, updated vaccines.    Last eye exam 09/2022 requested exam from [facility]

## 2022-11-24 ENCOUNTER — Telehealth: Payer: Self-pay | Admitting: *Deleted

## 2022-11-24 NOTE — Telephone Encounter (Signed)
Patient requesting refill on Oxycodone 10 @ [address].

## 2022-11-25 MED ORDER — OXYCODONE HCL 10 MG PO TABS: 10.0000 mg | ORAL_TABLET | Freq: Three times a day (TID) | ORAL | 0 refills | Status: DC | PRN

## 2022-11-25 NOTE — Telephone Encounter (Signed)
Patient has called 3 x requesting refills.

## 2022-11-25 NOTE — Telephone Encounter (Signed)
Patient notified

## 2022-12-23 ENCOUNTER — Other Ambulatory Visit: Payer: Self-pay | Admitting: Physical Medicine & Rehabilitation

## 2022-12-23 MED ORDER — OXYCODONE HCL 10 MG PO TABS: 10.0000 mg | ORAL_TABLET | Freq: Three times a day (TID) | ORAL | 0 refills | Status: DC | PRN

## 2022-12-23 NOTE — Telephone Encounter (Signed)
Per pmp last fill:  Filled  Written  ID  Drug  QTY  Days  Prescriber  RX #  Dispenser  Refill  Daily Dose*  Pymt Type  PMP  11/25/2022 11/25/2022 1  Oxycodone Hcl (Ir) 10 Mg Tab 90.00 30 Yu Sht [id number] [facility] ([id number]) 0/0 45.00 MME Medicare [place]

## 2022-12-30 ENCOUNTER — Ambulatory Visit: Payer: No Typology Code available for payment source | Admitting: Family

## 2022-12-30 ENCOUNTER — Encounter
Payer: No Typology Code available for payment source | Attending: Physical Medicine & Rehabilitation | Admitting: Physical Medicine & Rehabilitation

## 2022-12-30 VITALS — BP 128/76 | HR 84 | Ht 65.0 in | Wt 231.0 lb

## 2022-12-30 DIAGNOSIS — Z789 Other specified health status: Secondary | ICD-10-CM | POA: Diagnosis not present

## 2022-12-30 DIAGNOSIS — M25572 Pain in left ankle and joints of left foot: Secondary | ICD-10-CM

## 2022-12-30 DIAGNOSIS — Z79891 Long term (current) use of opiate analgesic: Secondary | ICD-10-CM

## 2022-12-30 DIAGNOSIS — G8929 Other chronic pain: Secondary | ICD-10-CM | POA: Diagnosis not present

## 2022-12-30 DIAGNOSIS — M25571 Pain in right ankle and joints of right foot: Secondary | ICD-10-CM | POA: Diagnosis not present

## 2022-12-30 MED ORDER — OXYCODONE HCL 10 MG PO TABS: 10.0000 mg | ORAL_TABLET | Freq: Three times a day (TID) | ORAL | 0 refills | Status: DC | PRN

## 2022-12-30 NOTE — Progress Notes (Unsigned)
Subjective:    Patient ID: Brandon Reyes, male    DOB: [birth year], 62 y.o.   MRN: [id number]  HPI  HPI 01/04/22 62 year old male here with PMH of type 2 DM, HTN here for chronic left ankle pain.  He broke his ankle in 2015 when he stepped off a curb and had ORIF.   He reports he had 2 other surgeries after this including ankle arthrodesis by Dr. Lorin Picket at [facility]. He continues to have severe pain since this time. Pain is constant but worsened by walking. He walks with a cane.  Pain is sharp and achy.  Ankle brace did not help his pain.  He had ankle injections without benefit. Patient reports he takes tylenol with minimal benefit. Reports percocet helped his pain in the past. When asked if he took lyrica, gabapentin his he wasn't certain but his partner thought he used these without benefit. She said he tried tramadol and it didn't help. Lidocaine cream and patch didn't help the pain. Has not tried TENS unit but has access to TENS.   Interval History 08/04/2022 [name] is here to follow-up regarding his chronic pain in his left ankle.  He continues to use oxycodone 10 mg 3 times daily as needed.  He reports this medicine is helping keep his pain under control.  Denies side effects with this medication.  He reports he is able to walk around his house when using the medication.  Allows him to be more active than go to stores and do other such activities with his wife.   Interval history 10/08/2022 [name] is here for follow-up regarding his chronic ankle pain.  He reports oxycodone continues to help keep his pain tolerable.  No side effects with this medication reported.  He does report he feels like he is having some discomfort in his right ankle as well, not as severe as his left side.  He denies any significant injury to this joint.  Patient is able to walk more and do more chores around the home, go to the store with the oxycodone medication.  Patient would also like to pick upKnee-high compression  stockings for dependent edema in his legs.  Interval history 12/30/22 [name] is here for follow-up for his left ankle pain.  He reports he is doing overall well and continues to use oxycodone.  He says this medicine helps keep his pain under control and he is able to do more than he used to do without medication.  He is still as active as he used to be when he was younger but he feels like he is able to function.  He is excited about his birthday coming up on [birth year].  He does have some occasional pain in his right ankle as well.  He says if this gets worse he will plan to see his podiatrist.    Pain Inventory Average Pain 5 Pain Right Now 5 My pain is intermittent and sharp  In the last 24 hours, has pain interfered with the following? General activity 3 Relation with others 2 Enjoyment of life 4 What TIME of day is your pain at its worst? morning  Sleep (in general) Fair  Pain is worse with: walking and standing Pain improves with: rest, heat/ice, and medication Relief from Meds: 4  Family History  Problem Relation Age of Onset   Hypertension Mother    Diabetes Mother    Hypertension Sister    Hypertension Maternal Grandmother  Hypertension Sister    Social History   Socioeconomic History   Marital status: Married    Spouse name: Not on file   Number of children: 0   Years of education: 12   Highest education level: Not on file  Occupational History   Occupation: Unemployed  Tobacco Use   Smoking status: Former    Types: Cigarettes   Smokeless tobacco: Never  Building services engineer Use: Never used  Substance and Sexual Activity   Alcohol use: Not Currently   Drug use: Not Currently   Sexual activity: Not on file  Other Topics Concern   Not on file  Social History Narrative    Fun/Hobby - Travel    Lives with Wife   Social Determinants of Health   Financial Resource Strain: Low Risk  (11/18/2022)   Overall Financial Resource Strain (CARDIA)    Difficulty  of Paying Living Expenses: Not hard at all  Food Insecurity: No Food Insecurity (11/18/2022)   Hunger Vital Sign    Worried About Running Out of Food in the Last Year: Never true    Ran Out of Food in the Last Year: Never true  Transportation Needs: No Transportation Needs (11/18/2022)   PRAPARE - Administrator, Civil Service (Medical): No    Lack of Transportation (Non-Medical): No  Physical Activity: Inactive (11/18/2022)   Exercise Vital Sign    Days of Exercise per Week: 0 days    Minutes of Exercise per Session: 0 min  Stress: No Stress Concern Present (11/18/2022)   [employer] of Occupational Health - Occupational Stress Questionnaire    Feeling of Stress : Not at all  Social Connections: Socially Integrated (11/18/2022)   Social Connection and Isolation Panel [NHANES]    Frequency of Communication with Friends and Family: More than three times a week    Frequency of Social Gatherings with Friends and Family: More than three times a week    Attends Religious Services: More than 4 times per year    Active Member of Clubs or Organizations: Yes    Attends Engineer, structural: More than 4 times per year    Marital Status: Married   Past Surgical History:  Procedure Laterality Date   ANKLE FRACTURE SURGERY     INSERTION OF MESH N/A 09/26/2016   Procedure: INSERTION OF MESH;  Surgeon: Gaynelle Adu, MD;  Location: [facility];  Service: General;  Laterality: N/A;   UMBILICAL HERNIA REPAIR N/A 09/26/2016   Procedure: LAPAROSCOPIC ASSISTED UMBILICAL HERNIA REPAIR WITH MESH;  Surgeon: Gaynelle Adu, MD;  Location: [facility];  Service: General;  Laterality: N/A;   Past Surgical History:  Procedure Laterality Date   ANKLE FRACTURE SURGERY     INSERTION OF MESH N/A 09/26/2016   Procedure: INSERTION OF MESH;  Surgeon: Gaynelle Adu, MD;  Location: [facility];  Service: General;  Laterality: N/A;   UMBILICAL HERNIA REPAIR N/A 09/26/2016   Procedure: LAPAROSCOPIC ASSISTED UMBILICAL  HERNIA REPAIR WITH MESH;  Surgeon: Gaynelle Adu, MD;  Location: [facility];  Service: General;  Laterality: N/A;   Past Medical History:  Diagnosis Date   Arthritis    Hypertension    There were no vitals taken for this visit.  Opioid Risk Score:   Fall Risk Score:  `1  Depression screen PHQ 2/9     11/18/2022    4:01 PM 05/13/2022    3:00 PM 03/25/2022    2:52 PM 03/25/2022    2:49 PM  02/04/2022   10:26 AM 01/04/2022    9:21 AM 12/14/2021   12:59 PM  Depression screen PHQ 2/9  Decreased Interest 0 0 0 0 0 0 0  Down, Depressed, Hopeless 0 0 0 0 0 0 0  PHQ - 2 Score 0 0 0 0 0 0 0     Review of Systems  Musculoskeletal:        B/L foot pain  All other systems reviewed and are negative.      Objective:   Physical Exam   Gen: no distress, normal appearing HEENT: oral mucosa pink and moist, NCAT Chest: normal effort, normal rate of breathing Abd:  non-distended Psych: pleasant Skin: intact Neuro: Awake and alert CN 2-12 grossly intact, no ataxia noted, follows commands Musculoskeletal no focal motor deficits. Minimal ROM at L ankle Sensation intact all 4 extremities L foot with well healed surgical incisions on anterior and lateral ankle Positive for left ankle swelling, no swelling R ankle Mild tenderness at medial and lateral left ankle, mild tenderness with ankle inversion and eversion -unchanged Minimal tenderness pending cane right ankle with range of motion in all direct joints        Assessment & Plan:    Chronic L ankle pain after ankle fracture and multiple surgeries -s/p ORIF and later ankle arthrodesis, seen by podiatry without any further procedural options -Reports poor results with multiple other medications and treatments             -He was unable to tolerate duloxetine              -Continue oxycodone 10 mg TID, reports keeping pain controlled, ordered             -Advised trying TENS unit             -Continue monitoring random UDS, pill  counts -Pill count is consistent  Right ankle pain -Pain mostly with ambulation.  Patient would like to hold off on x-ray at this time          Alcohol use history -Continue to reinforce abstinence from alcohol and illicit drugs -UDS last visit appears consistent

## 2022-12-31 ENCOUNTER — Encounter: Payer: Self-pay | Admitting: Physical Medicine & Rehabilitation

## 2023-01-13 ENCOUNTER — Other Ambulatory Visit (HOSPITAL_BASED_OUTPATIENT_CLINIC_OR_DEPARTMENT_OTHER): Payer: Self-pay

## 2023-01-13 ENCOUNTER — Ambulatory Visit (INDEPENDENT_AMBULATORY_CARE_PROVIDER_SITE_OTHER): Payer: No Typology Code available for payment source | Admitting: Family

## 2023-01-13 VITALS — BP 122/80 | HR 75 | Temp 97.5°F | Resp 18 | Ht 65.0 in | Wt 224.2 lb

## 2023-01-13 DIAGNOSIS — E785 Hyperlipidemia, unspecified: Secondary | ICD-10-CM

## 2023-01-13 DIAGNOSIS — R7309 Other abnormal glucose: Secondary | ICD-10-CM

## 2023-01-13 DIAGNOSIS — I10 Essential (primary) hypertension: Secondary | ICD-10-CM | POA: Diagnosis not present

## 2023-01-13 DIAGNOSIS — N529 Male erectile dysfunction, unspecified: Secondary | ICD-10-CM

## 2023-01-13 DIAGNOSIS — Z86010 Personal history of colonic polyps: Secondary | ICD-10-CM | POA: Diagnosis not present

## 2023-01-13 DIAGNOSIS — E119 Type 2 diabetes mellitus without complications: Secondary | ICD-10-CM

## 2023-01-13 DIAGNOSIS — E782 Mixed hyperlipidemia: Secondary | ICD-10-CM

## 2023-01-13 DIAGNOSIS — Z1211 Encounter for screening for malignant neoplasm of colon: Secondary | ICD-10-CM

## 2023-01-13 LAB — CBC WITH DIFFERENTIAL/PLATELET
HCT: 45.4 % (ref 38.5–50.0)
Neutrophils Relative %: 56.5 %
Total Lymphocyte: 31.8 %

## 2023-01-13 MED ORDER — SILDENAFIL CITRATE 100 MG PO TABS: ORAL_TABLET | ORAL | 0 refills | Status: DC

## 2023-01-13 MED ORDER — SHINGRIX 50 MCG/0.5ML IM SUSR
INTRAMUSCULAR | 0 refills | Status: DC
  Filled 2023-01-13: qty 0.5, 1d supply, fill #0

## 2023-01-13 NOTE — Progress Notes (Unsigned)
Brandon Reyes is a 62 y.o. male with the following history as recorded in EpicCare:  Patient Active Problem List   Diagnosis Date Noted   Change in bowel habit 09/14/2021   Constipation 09/14/2021   Morbid obesity (HCC) 09/14/2021   Personal history of colonic polyps 09/14/2021   Arthritis of left subtalar joint 10/25/2018   Newly diagnosed diabetes (HCC) 06/18/2018   Prediabetes 04/03/2018   Vitamin D deficiency 03/16/2018   Encounter for general adult medical examination with abnormal findings 03/16/2018   Sinus tachycardia 08/23/2017   Anemia 08/23/2017   Pain from implanted hardware 05/18/2017   Essential hypertension 12/29/2016   Arthritis of left ankle 11/17/2016    Current Outpatient Medications  Medication Sig Dispense Refill   amLODipine (NORVASC) 5 MG tablet TAKE 1 TABLET DAILY 90 tablet 1   atorvastatin (LIPITOR) 40 MG tablet TAKE 1 TABLET DAILY 90 tablet 1   Cyanocobalamin 2500 MCG TABS Take 2,500 mcg by mouth every other day. Vitamin B12     metoprolol succinate (TOPROL-XL) 25 MG 24 hr tablet TAKE 1 TABLET DAILY 90 tablet 1   Omega-3 Fatty Acids (FISH OIL PO) Take by mouth.     [START ON 01/20/2023] Oxycodone HCl 10 MG TABS Take 1 tablet (10 mg total) by mouth 3 (three) times daily as needed. 90 tablet 0   sildenafil (VIAGRA) 100 MG tablet TAKE 1 TABLET DAILY AS     NEEDED FOR ERECTILE        DYSFUNCTION 18 tablet 0   Zoster Vaccine Adjuvanted [facility]) injection Inject into the muscle. 1 each 1   Current Facility-Administered Medications  Medication Dose Route Frequency Provider Last Rate Last Admin   diclofenac Sodium (VOLTAREN) 1 % topical gel 4 g  4 g Topical QID Fanny Dance, MD        Allergies: Duloxetine hcl and Tylenol [acetaminophen]  Past Medical History:  Diagnosis Date   Arthritis    Hypertension     Past Surgical History:  Procedure Laterality Date   ANKLE FRACTURE SURGERY     INSERTION OF MESH N/A 09/26/2016   Procedure: INSERTION OF MESH;   Surgeon: Gaynelle Adu, MD;  Location: [facility];  Service: General;  Laterality: N/A;   UMBILICAL HERNIA REPAIR N/A 09/26/2016   Procedure: LAPAROSCOPIC ASSISTED UMBILICAL HERNIA REPAIR WITH MESH;  Surgeon: Gaynelle Adu, MD;  Location: [facility];  Service: General;  Laterality: N/A;    Family History  Problem Relation Age of Onset   Hypertension Mother    Diabetes Mother    Hypertension Sister    Hypertension Maternal Grandmother    Hypertension Sister     Social History   Tobacco Use   Smoking status: Former    Types: Cigarettes   Smokeless tobacco: Never  Substance Use Topics   Alcohol use: Not Currently    Subjective:  ***  @HCCSF @  Objective:  Vitals:   01/13/23 1353  BP: 122/80  Pulse: 75  Resp: 18  Temp: (!) 97.5 F (36.4 C)  TempSrc: Oral  SpO2: 98%  Weight: 224 lb 3.2 oz (101.7 kg)  Height: 5\' 5"  (1.651 m)    General: Well developed, well nourished, in no acute distress  Skin : Warm and dry.  Head: Normocephalic and atraumatic  Eyes: Sclera and conjunctiva clear; pupils round and reactive to light; extraocular movements intact  Ears: External normal; canals clear; tympanic membranes normal  Oropharynx: Pink, supple. No suspicious lesions  Neck: Supple without thyromegaly, adenopathy  Lungs: Respirations unlabored;  clear to auscultation bilaterally without wheeze, rales, rhonchi  CVS exam: {heart exam:315510::"normal rate, regular rhythm, normal S1, S2, no murmurs, rubs, clicks or gallops"}.  Abdomen: Soft; nontender; nondistended; normoactive bowel sounds; no masses or hepatosplenomegaly  Musculoskeletal: No deformities; no active joint inflammation  Extremities: No edema, cyanosis, clubbing  Vessels: Symmetric bilaterally  Neurologic: Alert and oriented; speech intact; face symmetrical; moves all extremities well; CNII-XII intact without focal deficit  Assessment:  1. Need for shingles vaccine   2. Encounter for colonoscopy due to history of adenomatous colonic  polyps     Plan:  ***   No follow-ups on file.  Orders Placed This Encounter  Procedures   Ambulatory referral to Gastroenterology    Referral Priority:   Routine    Referral Type:   Consultation    Referral Reason:   Specialty Services Required    Number of Visits Requested:   1    Requested Prescriptions    No prescriptions requested or ordered in this encounter

## 2023-01-14 LAB — COMPREHENSIVE METABOLIC PANEL
AG Ratio: 1.6 (calc) (ref 1.0–2.5)
ALT: 21 U/L (ref 9–46)
AST: 18 U/L (ref 10–35)
Albumin: 3.5 g/dL — ABNORMAL LOW (ref 3.6–5.1)
Alkaline phosphatase (APISO): 39 U/L (ref 35–144)
BUN: 14 mg/dL (ref 7–25)
CO2: 23 mmol/L (ref 20–32)
Calcium: 8.8 mg/dL (ref 8.6–10.3)
Chloride: 109 mmol/L (ref 98–110)
Creat: 0.96 mg/dL (ref 0.70–1.35)
Globulin: 2.2 g/dL (calc) (ref 1.9–3.7)
Glucose, Bld: 107 mg/dL — ABNORMAL HIGH (ref 65–99)
Potassium: 4.2 mmol/L (ref 3.5–5.3)
Sodium: 140 mmol/L (ref 135–146)
Total Bilirubin: 0.3 mg/dL (ref 0.2–1.2)
Total Protein: 5.7 g/dL — ABNORMAL LOW (ref 6.1–8.1)

## 2023-01-14 LAB — CBC WITH DIFFERENTIAL/PLATELET
Absolute Monocytes: 956 cells/uL — ABNORMAL HIGH (ref 200–950)
Basophils Absolute: 118 cells/uL (ref 0–200)
Basophils Relative: 0.9 %
Eosinophils Absolute: 459 cells/uL (ref 15–500)
Eosinophils Relative: 3.5 %
Hemoglobin: 14.8 g/dL (ref 13.2–17.1)
Lymphs Abs: 4166 cells/uL — ABNORMAL HIGH (ref 850–3900)
MCH: 28.6 pg (ref 27.0–33.0)
MCHC: 32.6 g/dL (ref 32.0–36.0)
MCV: 87.6 fL (ref 80.0–100.0)
MPV: 10.4 fL (ref 7.5–12.5)
Monocytes Relative: 7.3 %
Neutro Abs: 7402 cells/uL (ref 1500–7800)
Platelets: 243 10*3/uL (ref 140–400)
RBC: 5.18 10*6/uL (ref 4.20–5.80)
RDW: 12.5 % (ref 11.0–15.0)
WBC: 13.1 10*3/uL — ABNORMAL HIGH (ref 3.8–10.8)

## 2023-01-14 LAB — LIPID PANEL
Cholesterol: 142 mg/dL (ref <200)
HDL: 42 mg/dL (ref >=40)
LDL Cholesterol (Calc): 82 mg/dL (calc)
Non-HDL Cholesterol (Calc): 100 mg/dL (calc) (ref <130)
Total CHOL/HDL Ratio: 3.4 (calc) (ref <5.0)
Triglycerides: 87 mg/dL (ref <150)

## 2023-01-14 LAB — HEMOGLOBIN A1C
Hgb A1c MFr Bld: 6.3 % of total Hgb — ABNORMAL HIGH (ref <5.7)
Mean Plasma Glucose: 134 mg/dL
eAG (mmol/L): 7.4 mmol/L

## 2023-01-15 ENCOUNTER — Other Ambulatory Visit: Payer: Self-pay | Admitting: Family

## 2023-01-15 DIAGNOSIS — I10 Essential (primary) hypertension: Secondary | ICD-10-CM

## 2023-01-15 DIAGNOSIS — E782 Mixed hyperlipidemia: Secondary | ICD-10-CM

## 2023-01-16 ENCOUNTER — Other Ambulatory Visit: Payer: Self-pay | Admitting: Family

## 2023-01-16 DIAGNOSIS — D729 Disorder of white blood cells, unspecified: Secondary | ICD-10-CM

## 2023-01-17 ENCOUNTER — Telehealth: Payer: Self-pay | Admitting: Gastroenterology

## 2023-01-17 NOTE — Telephone Encounter (Signed)
Hi Dr. Barron Alvine,  Supervising Provider: 01/17/2023 AM  We received a referral for patient to have a colonoscopy done. He does have GI history with Dr. Loreta Ave and is requesting to transfer his Gi care over to [facility]. Records and op reports were obtained and scanned into Media for you to review and advise on scheduling.    Thanks

## 2023-01-18 ENCOUNTER — Telehealth: Payer: Self-pay

## 2023-01-18 NOTE — Telephone Encounter (Signed)
PA initiated via Covermymeds; KEY: [id number]. Awaiting determination.

## 2023-01-18 NOTE — Telephone Encounter (Signed)
PA denied.   Why did we deny your request? We denied this request under Medicare Part D because: Your plan's Medicare Part D drug plan cannot cover  drugs used for the treatment of sexual or erectile dysfunction. Your Medicare Part D drug plan was asked to  cover the requested drug. The requested drug is used for the treatment of sexual or erectile dysfunction.  Under section 1927(d)(2) of the Social Security Act, drugs used for the treatment of sexual or erectile  dysfunction are excluded from coverage under the Medicare Part D drug benefit. Erectile dysfunction drugs  will not meet the definition of a Part D drug when used off-label, even when the off-label use is listed in one of  the compendia. Since the requested drug is excluded from Medicare Part D coverage, the request for  coverage under your Medicare Part D plan is denied.   However, your Plan's supplemental coverage does cover a maximum of 6 tablets of SILDENAFIL CITRATE  100MG  Tablet per month.

## 2023-02-15 ENCOUNTER — Telehealth: Payer: Self-pay | Admitting: *Deleted

## 2023-02-15 MED ORDER — OXYCODONE HCL 10 MG PO TABS: 10.0000 mg | ORAL_TABLET | Freq: Three times a day (TID) | ORAL | 0 refills | Status: DC | PRN

## 2023-02-15 NOTE — Telephone Encounter (Signed)
Patient requesting refill on Oxycodone. Last refill 01/20/23 #90 1 tab tid.

## 2023-02-15 NOTE — Telephone Encounter (Signed)
Patient notified

## 2023-02-16 ENCOUNTER — Other Ambulatory Visit: Payer: Self-pay | Admitting: Family

## 2023-02-16 MED ORDER — SILDENAFIL CITRATE 100 MG PO TABS: ORAL_TABLET | ORAL | 3 refills | Status: DC

## 2023-03-03 ENCOUNTER — Ambulatory Visit (AMBULATORY_SURGERY_CENTER): Payer: No Typology Code available for payment source

## 2023-03-03 VITALS — Ht 65.0 in | Wt 227.8 lb

## 2023-03-03 DIAGNOSIS — Z86010 Personal history of colonic polyps: Secondary | ICD-10-CM

## 2023-03-03 MED ORDER — NA SULFATE-K SULFATE-MG SULF 17.5-3.13-1.6 GM/177ML PO SOLN
1.0000 | Freq: Once | ORAL | 0 refills | Status: AC
Start: 2023-03-03 — End: 2023-03-03

## 2023-03-03 NOTE — Progress Notes (Signed)
No egg or soy allergy known to patient  No issues known to pt with past sedation with any surgeries or procedures Patient denies ever being told they had issues or difficulty with intubation  No FH of Malignant Hyperthermia Pt is not on diet pills Pt is not on  home 02  Pt is not on blood thinners  Pt denies issues with constipation  No A fib or A flutter Have any cardiac testing pending--no  LOA: independent  Prep: suprep   Patient's chart reviewed by Cathlyn Parsons CNRA prior to previsit and patient appropriate for the [facility].  Previsit completed and red dot placed by patient's name on their procedure day (on provider's schedule).     PV competed with patient. Prep instructions sent via mychart and hard copy given at visit. Goodrx coupon for [employer] provided to use for price reduction if needed.

## 2023-03-09 ENCOUNTER — Encounter: Payer: Self-pay | Admitting: Gastroenterology

## 2023-03-17 ENCOUNTER — Encounter: Payer: Self-pay | Admitting: Physical Medicine & Rehabilitation

## 2023-03-17 ENCOUNTER — Encounter
Payer: No Typology Code available for payment source | Attending: Physical Medicine & Rehabilitation | Admitting: Physical Medicine & Rehabilitation

## 2023-03-17 VITALS — BP 120/76 | HR 80 | Ht 65.0 in | Wt 227.0 lb

## 2023-03-17 DIAGNOSIS — G894 Chronic pain syndrome: Secondary | ICD-10-CM | POA: Insufficient documentation

## 2023-03-17 DIAGNOSIS — Z5181 Encounter for therapeutic drug level monitoring: Secondary | ICD-10-CM | POA: Diagnosis not present

## 2023-03-17 DIAGNOSIS — Z79891 Long term (current) use of opiate analgesic: Secondary | ICD-10-CM | POA: Diagnosis not present

## 2023-03-17 MED ORDER — OXYCODONE HCL 10 MG PO TABS: 10.0000 mg | ORAL_TABLET | Freq: Three times a day (TID) | ORAL | 0 refills | Status: DC | PRN

## 2023-03-17 NOTE — Progress Notes (Signed)
Subjective:    Patient ID: Brandon Reyes, male    DOB: June 22, 1961, 62 y.o.   MRN: [id number]  HPI  HPI 01/04/22 62 year old male here with PMH of type 2 DM, HTN here for chronic left ankle pain.  He broke his ankle in 2015 when he stepped off a curb and had ORIF.   He reports he had 2 other surgeries after this including ankle arthrodesis by Dr. Lorin Picket at [facility]. He continues to have severe pain since this time. Pain is constant but worsened by walking. He walks with a cane.  Pain is sharp and achy.  Ankle brace did not help his pain.  He had ankle injections without benefit. Patient reports he takes tylenol with minimal benefit. Reports percocet helped his pain in the past. When asked if he took lyrica, gabapentin his he wasn't certain but his partner thought he used these without benefit. She said he tried tramadol and it didn't help. Lidocaine cream and patch didn't help the pain. Has not tried TENS unit but has access to TENS.   Interval History 08/04/2022 Brandon Reyes is here to follow-up regarding his chronic pain in his left ankle.  He continues to use oxycodone 10 mg 3 times daily as needed.  He reports this medicine is helping keep his pain under control.  Denies side effects with this medication.  He reports he is able to walk around his house when using the medication.  Allows him to be more active than go to stores and do other such activities with his wife.   Interval history 10/08/2022 Brandon Reyes is here for follow-up regarding his chronic ankle pain.  He reports oxycodone continues to help keep his pain tolerable.  No side effects with this medication reported.  He does report he feels like he is having some discomfort in his right ankle as well, not as severe as his left side.  He denies any significant injury to this joint.  Patient is able to walk more and do more chores around the home, go to the store with the oxycodone medication.  Patient would also like to pick upKnee-high compression  stockings for dependent edema in his legs.  Interval history 12/30/22 Brandon Reyes is here for follow-up for his left ankle pain.  He reports he is doing overall well and continues to use oxycodone.  He says this medicine helps keep his pain under control and he is able to do more than he used to do without medication.  He is still as active as he used to be when he was younger but he feels like he is able to function.  He is excited about his birthday coming up on [birth year].  He does have some occasional pain in his right ankle as well.  He says if this gets worse he will plan to see his podiatrist.  Interval history 03/17/23 Brandon Reyes is here for follow-up regarding his ankle pain.  He continues to have pain with ambulation in his left ankle.  Pain has not changed since last visit.  Oxycodone continues to help keep his pain under control.  He tries to use it when his pain is severe.  He is not having any side effects with oxycodone.  He has some pain and swelling in his left ankle today from increased ambulation.    Pain Inventory Average Pain 7 Pain Right Now 7 My pain is intermittent and sharp  In the last 24 hours, has pain interfered  with the following? General activity 4 Relation with others 4 Enjoyment of life 4 What TIME of day is your pain at its worst? daytime Sleep (in general) Fair  Pain is worse with: walking and standing Pain improves with: rest, heat/ice, and medication Relief from Meds: 5  Family History  Problem Relation Age of Onset   Hypertension Mother    Diabetes Mother    Hypertension Sister    Hypertension Sister    Hypertension Maternal Grandmother    Colon cancer Other    Colon polyps Neg Hx    Esophageal cancer Neg Hx    Rectal cancer Neg Hx    Stomach cancer Neg Hx    Social History   Socioeconomic History   Marital status: Married    Spouse name: Not on file   Number of children: 0   Years of education: 12   Highest education level: Not on file   Occupational History   Occupation: Unemployed  Tobacco Use   Smoking status: Former    Types: Cigarettes   Smokeless tobacco: Never  Vaping Use   Vaping status: Never Used  Substance and Sexual Activity   Alcohol use: Not Currently   Drug use: Not Currently   Sexual activity: Not on file  Other Topics Concern   Not on file  Social History Narrative    Fun/Hobby - Travel    Lives with Wife   Social Determinants of Health   Financial Resource Strain: Low Risk  (11/18/2022)   Overall Financial Resource Strain (CARDIA)    Difficulty of Paying Living Expenses: Not hard at all  Food Insecurity: No Food Insecurity (11/18/2022)   Hunger Vital Sign    Worried About Running Out of Food in the Last Year: Never true    Ran Out of Food in the Last Year: Never true  Transportation Needs: No Transportation Needs (11/18/2022)   PRAPARE - Administrator, Civil Service (Medical): No    Lack of Transportation (Non-Medical): No  Physical Activity: Inactive (11/18/2022)   Exercise Vital Sign    Days of Exercise per Week: 0 days    Minutes of Exercise per Session: 0 min  Stress: No Stress Concern Present (11/18/2022)   [employer] of Occupational Health - Occupational Stress Questionnaire    Feeling of Stress : Not at all  Social Connections: Socially Integrated (11/18/2022)   Social Connection and Isolation Panel [NHANES]    Frequency of Communication with Friends and Family: More than three times a week    Frequency of Social Gatherings with Friends and Family: More than three times a week    Attends Religious Services: More than 4 times per year    Active Member of Clubs or Organizations: Yes    Attends Engineer, structural: More than 4 times per year    Marital Status: Married   Past Surgical History:  Procedure Laterality Date   ANKLE FRACTURE SURGERY     INSERTION OF MESH N/A 09/26/2016   Procedure: INSERTION OF MESH;  Surgeon: Gaynelle Adu, MD;  Location: [facility];  Service: General;  Laterality: N/A;   UMBILICAL HERNIA REPAIR N/A 09/26/2016   Procedure: LAPAROSCOPIC ASSISTED UMBILICAL HERNIA REPAIR WITH MESH;  Surgeon: Gaynelle Adu, MD;  Location: [facility];  Service: General;  Laterality: N/A;   Past Surgical History:  Procedure Laterality Date   ANKLE FRACTURE SURGERY     INSERTION OF MESH N/A 09/26/2016   Procedure: INSERTION OF MESH;  Surgeon: Minerva Areola  Andrey Campanile, MD;  Location: [facility];  Service: General;  Laterality: N/A;   UMBILICAL HERNIA REPAIR N/A 09/26/2016   Procedure: LAPAROSCOPIC ASSISTED UMBILICAL HERNIA REPAIR WITH MESH;  Surgeon: Gaynelle Adu, MD;  Location: [facility];  Service: General;  Laterality: N/A;   Past Medical History:  Diagnosis Date   Arthritis    Hypertension    There were no vitals taken for this visit.  Opioid Risk Score:   Fall Risk Score:  `1  Depression screen PHQ 2/9     11/18/2022    4:01 PM 05/13/2022    3:00 PM 03/25/2022    2:52 PM 03/25/2022    2:49 PM 02/04/2022   10:26 AM 01/04/2022    9:21 AM 12/14/2021   12:59 PM  Depression screen PHQ 2/9  Decreased Interest 0 0 0 0 0 0 0  Down, Depressed, Hopeless 0 0 0 0 0 0 0  PHQ - 2 Score 0 0 0 0 0 0 0     Review of Systems  Constitutional: Negative.   HENT: Negative.    Eyes: Negative.   Respiratory: Negative.    Cardiovascular: Negative.   Gastrointestinal: Negative.   Endocrine: Negative.   Genitourinary: Negative.   Musculoskeletal:        B/L foot pain  Allergic/Immunologic: Negative.   Neurological: Negative.   Hematological: Negative.   Psychiatric/Behavioral: Negative.    All other systems reviewed and are negative.      Objective:   Physical Exam   Gen: no distress, normal appearing HEENT: oral mucosa pink and moist, NCAT Chest: normal effort, normal rate of breathing Abd:  non-distended Psych: pleasant, appropriate Skin: intact Neuro: Awake and alert CN 2-12 grossly intact, no ataxia noted, follows commands Musculoskeletal no focal  motor deficits. Minimal ROM at L ankle Sensation intact all 4 extremities L foot with well healed surgical incisions on anterior and lateral ankle Positive for left ankle swelling-a little more than last time, no swelling R ankle Mild tenderness at medial and lateral left ankle, mild tenderness with ankle inversion and eversion -unchanged         Assessment & Plan:    Chronic L ankle pain after ankle fracture and multiple surgeries -s/p ORIF and later ankle arthrodesis, seen by podiatry without any further procedural options -Reports poor results with multiple other medications and treatments             -He was unable to tolerate duloxetine              -Continue oxycodone 10 mg TID, reports keeping pain controlled, reordered             -Advised trying TENS unit             -Continue monitoring random UDS, pill counts, PDMP -Pill count is consistent today  Right ankle pain -Pain mostly with ambulation.  Not interested in x-rays at this time.  Continue medications as above.  Patient thinks his altered gait from his left ankle is contributing to this-I agree this may be contributing          Alcohol use history -Continue to reinforce abstinence from alcohol and illicit drugs -Continue UDS monitoring

## 2023-03-23 LAB — TOXASSURE SELECT,+ANTIDEPR,UR

## 2023-03-24 ENCOUNTER — Encounter: Payer: Self-pay | Admitting: Gastroenterology

## 2023-03-24 ENCOUNTER — Ambulatory Visit (AMBULATORY_SURGERY_CENTER): Payer: No Typology Code available for payment source | Admitting: Gastroenterology

## 2023-03-24 ENCOUNTER — Ambulatory Visit: Payer: No Typology Code available for payment source | Admitting: Physical Medicine & Rehabilitation

## 2023-03-24 VITALS — BP 108/72 | HR 61 | Temp 98.2°F | Resp 11 | Ht 65.0 in | Wt 227.8 lb

## 2023-03-24 DIAGNOSIS — Z86010 Personal history of colonic polyps: Secondary | ICD-10-CM

## 2023-03-24 DIAGNOSIS — Z1211 Encounter for screening for malignant neoplasm of colon: Secondary | ICD-10-CM | POA: Diagnosis not present

## 2023-03-24 DIAGNOSIS — K5730 Diverticulosis of large intestine without perforation or abscess without bleeding: Secondary | ICD-10-CM

## 2023-03-24 DIAGNOSIS — Z09 Encounter for follow-up examination after completed treatment for conditions other than malignant neoplasm: Secondary | ICD-10-CM

## 2023-03-24 DIAGNOSIS — I10 Essential (primary) hypertension: Secondary | ICD-10-CM | POA: Diagnosis not present

## 2023-03-24 MED ORDER — SODIUM CHLORIDE 0.9 % IV SOLN: 500.0000 mL | Freq: Once | INTRAVENOUS | Status: DC

## 2023-03-24 NOTE — Progress Notes (Signed)
Report to PACU, RN, vss, BBS= Clear.  

## 2023-03-24 NOTE — Patient Instructions (Signed)
Resume previous diet and medications. Repeat Colonoscopy in 7 years for surveillance.  YOU HAD AN ENDOSCOPIC PROCEDURE TODAY AT THE [facility]:   Refer to the procedure report that was given to you for any specific questions about what was found during the examination.  If the procedure report does not answer your questions, please call your gastroenterologist to clarify.  If you requested that your care partner not be given the details of your procedure findings, then the procedure report has been included in a sealed envelope for you to review at your convenience later.  YOU SHOULD EXPECT: Some feelings of bloating in the abdomen. Passage of more gas than usual.  Walking can help get rid of the air that was put into your GI tract during the procedure and reduce the bloating. If you had a lower endoscopy (such as a colonoscopy or flexible sigmoidoscopy) you may notice spotting of blood in your stool or on the toilet paper. If you underwent a bowel prep for your procedure, you may not have a normal bowel movement for a few days.  Please Note:  You might notice some irritation and congestion in your nose or some drainage.  This is from the oxygen used during your procedure.  There is no need for concern and it should clear up in a day or so.  SYMPTOMS TO REPORT IMMEDIATELY:  Following lower endoscopy (colonoscopy or flexible sigmoidoscopy):  Excessive amounts of blood in the stool  Significant tenderness or worsening of abdominal pains  Swelling of the abdomen that is new, acute  Fever of 100F or higher  For urgent or emergent issues, a gastroenterologist can be reached at any hour by calling [phone]. Do not use MyChart messaging for urgent concerns.    DIET:  We do recommend a small meal at first, but then you may proceed to your regular diet.  Drink plenty of fluids but you should avoid alcoholic beverages for 24 hours.  ACTIVITY:  You should plan to take it easy for the  rest of today and you should NOT DRIVE or use heavy machinery until tomorrow (because of the sedation medicines used during the test).    FOLLOW UP: Our staff will call the number listed on your records the next business day following your procedure.  We will call around 7:15- 8:00 am to check on you and address any questions or concerns that you may have regarding the information given to you following your procedure. If we do not reach you, we will leave a message.     If any biopsies were taken you will be contacted by phone or by letter within the next 1-3 weeks.  Please call us at [phone] if you have not heard about the biopsies in 3 weeks.    SIGNATURES/CONFIDENTIALITY: You and/or your care partner have signed paperwork which will be entered into your electronic medical record.  These signatures attest to the fact that that the information above on your After Visit Summary has been reviewed and is understood.  Full responsibility of the confidentiality of this discharge information lies with you and/or your care-partner.

## 2023-03-24 NOTE — Progress Notes (Signed)
Pt's states no medical or surgical changes since previsit or office visit. 

## 2023-03-24 NOTE — Progress Notes (Signed)
GASTROENTEROLOGY PROCEDURE H&P NOTE   Primary Care Physician: Olive Bass, FNP    Reason for Procedure:  Colon polyp surveillance  Plan:    Colonoscopy  Patient is appropriate for endoscopic procedure(s) in the ambulatory ([facility]) setting.  The nature of the procedure, as well as the risks, benefits, and alternatives were carefully and thoroughly reviewed with the patient. Ample time for discussion and questions allowed. The patient understood, was satisfied, and agreed to proceed.     HPI: Brandon Reyes is a 62 y.o. male who presents for colonoscopy for ongoing colon polyp surveillance.  No active GI symptoms.    Last colonoscopy was 12/18/2017 and notable for pandiverticulosis, tubular adenoma, hyperplastic polyp, with recommendation to repeat in 5 years.   Previous to that, colonoscopy in 09/2011 with pandiverticulosis, small cecal hyperplastic polyp, 5-6 mm polyp in the left colon (adenoma).   No known family history of colon cancer or related malignancy.  Patient is otherwise without complaints or active issues today.  Past Medical History:  Diagnosis Date   Arthritis    Hypertension     Past Surgical History:  Procedure Laterality Date   ANKLE FRACTURE SURGERY     COLONOSCOPY  2019   polyps, Dr Loreta Ave   INSERTION OF MESH N/A 09/26/2016   Procedure: INSERTION OF MESH;  Surgeon: Gaynelle Adu, MD;  Location: [facility];  Service: General;  Laterality: N/A;   UMBILICAL HERNIA REPAIR N/A 09/26/2016   Procedure: LAPAROSCOPIC ASSISTED UMBILICAL HERNIA REPAIR WITH MESH;  Surgeon: Gaynelle Adu, MD;  Location: [facility];  Service: General;  Laterality: N/A;    Prior to Admission medications   Medication Sig Start Date End Date Taking? Authorizing Provider  amLODipine (NORVASC) 5 MG tablet TAKE 1 TABLET DAILY 01/16/23  Yes Olive Bass, FNP  atorvastatin (LIPITOR) 40 MG tablet TAKE 1 TABLET DAILY 01/16/23  Yes Olive Bass, FNP  Cholecalciferol (VITAMIN D3)  50 MCG (2000 UT) capsule Take 2,000 Units by mouth daily. 08/25/17  Yes [provider]  Cyanocobalamin 2500 MCG TABS Take 2,500 mcg by mouth every other day. Vitamin B12   Yes [provider]  metoprolol succinate (TOPROL-XL) 25 MG 24 hr tablet TAKE 1 TABLET DAILY 01/16/23  Yes Olive Bass, FNP  Omega-3 Fatty Acids (FISH OIL PO) Take by mouth.   Yes [provider]  Oxycodone HCl 10 MG TABS Take 1 tablet (10 mg total) by mouth 3 (three) times daily as needed. 03/17/23  Yes Fanny Dance, MD  Oxycodone HCl 10 MG TABS Take 1 tablet (10 mg total) by mouth 3 (three) times daily as needed. 04/17/23   Fanny Dance, MD  sildenafil (VIAGRA) 100 MG tablet TAKE 1 TABLET DAILY AS     NEEDED FOR ERECTILE        DYSFUNCTION 02/16/23   Olive Bass, FNP    Current Outpatient Medications  Medication Sig Dispense Refill   amLODipine (NORVASC) 5 MG tablet TAKE 1 TABLET DAILY 90 tablet 1   atorvastatin (LIPITOR) 40 MG tablet TAKE 1 TABLET DAILY 90 tablet 1   Cholecalciferol (VITAMIN D3) 50 MCG (2000 UT) capsule Take 2,000 Units by mouth daily.     Cyanocobalamin 2500 MCG TABS Take 2,500 mcg by mouth every other day. Vitamin B12     metoprolol succinate (TOPROL-XL) 25 MG 24 hr tablet TAKE 1 TABLET DAILY 90 tablet 1   Omega-3 Fatty Acids (FISH OIL PO) Take by mouth.     Oxycodone HCl 10  MG TABS Take 1 tablet (10 mg total) by mouth 3 (three) times daily as needed. 90 tablet 0   [START ON 04/17/2023] Oxycodone HCl 10 MG TABS Take 1 tablet (10 mg total) by mouth 3 (three) times daily as needed. 90 tablet 0   sildenafil (VIAGRA) 100 MG tablet TAKE 1 TABLET DAILY AS     NEEDED FOR ERECTILE        DYSFUNCTION 18 tablet 3   Current Facility-Administered Medications  Medication Dose Route Frequency Provider Last Rate Last Admin   0.9 %  sodium chloride infusion  500 mL Intravenous Once Rasheen Bells V, DO       diclofenac Sodium (VOLTAREN) 1 % topical gel 4 g  4 g  Topical QID Fanny Dance, MD        Allergies as of 03/24/2023 - Review Complete 03/24/2023  Allergen Reaction Noted   Duloxetine hcl Nausea Only and Other (See Comments) 12/14/2021   Tylenol [acetaminophen] Hives 12/07/2021    Family History  Problem Relation Age of Onset   Hypertension Mother    Diabetes Mother    Hypertension Sister    Hypertension Sister    Hypertension Maternal Grandmother    Colon cancer Other    Colon polyps Neg Hx    Esophageal cancer Neg Hx    Rectal cancer Neg Hx    Stomach cancer Neg Hx     Social History   Socioeconomic History   Marital status: Married    Spouse name: Not on file   Number of children: 0   Years of education: 12   Highest education level: Not on file  Occupational History   Occupation: Unemployed  Tobacco Use   Smoking status: Former    Types: Cigarettes   Smokeless tobacco: Never  Vaping Use   Vaping status: Never Used  Substance and Sexual Activity   Alcohol use: Not Currently   Drug use: Not Currently   Sexual activity: Yes  Other Topics Concern   Not on file  Social History Narrative    Fun/Hobby - Travel    Lives with Wife   Social Determinants of Health   Financial Resource Strain: Low Risk  (11/18/2022)   Overall Financial Resource Strain (CARDIA)    Difficulty of Paying Living Expenses: Not hard at all  Food Insecurity: No Food Insecurity (11/18/2022)   Hunger Vital Sign    Worried About Running Out of Food in the Last Year: Never true    Ran Out of Food in the Last Year: Never true  Transportation Needs: No Transportation Needs (11/18/2022)   PRAPARE - Administrator, Civil Service (Medical): No    Lack of Transportation (Non-Medical): No  Physical Activity: Inactive (11/18/2022)   Exercise Vital Sign    Days of Exercise per Week: 0 days    Minutes of Exercise per Session: 0 min  Stress: No Stress Concern Present (11/18/2022)   [employer] of Occupational Health - Occupational  Stress Questionnaire    Feeling of Stress : Not at all  Social Connections: Socially Integrated (11/18/2022)   Social Connection and Isolation Panel [NHANES]    Frequency of Communication with Friends and Family: More than three times a week    Frequency of Social Gatherings with Friends and Family: More than three times a week    Attends Religious Services: More than 4 times per year    Active Member of [employer] or Organizations: Yes    Attends Banker Meetings:  More than 4 times per year    Marital Status: Married  Catering manager Violence: Not At Risk (11/18/2022)   Humiliation, Afraid, Rape, and Kick questionnaire    Fear of Current or Ex-Partner: No    Emotionally Abused: No    Physically Abused: No    Sexually Abused: No    Physical Exam: Vital signs in last 24 hours: @BP  (!) 126/56   Pulse (!) 56   Temp 98.2 F (36.8 C) (Temporal)   Ht 5\' 5"  (1.651 m)   Wt 227 lb 12.8 oz (103.3 kg)   SpO2 98%   BMI 37.91 kg/m  GEN: NAD EYE: Sclerae anicteric ENT: MMM CV: Non-tachycardic Pulm: CTA b/l GI: Soft, NT/ND NEURO:  Alert & Oriented x 3   Doristine Locks, DO  Gastroenterology   03/24/2023 1:04 PM

## 2023-03-24 NOTE — Op Note (Signed)
[facility] Patient Name: Brandon Reyes Procedure Date: 03/24/2023 1:16 PM MRN: [id number] Endoscopist: Doristine Locks , MD, [id number] Age: 62 Referring MD:  Date of Birth: [birth year] Gender: Male Account #: [id number] Procedure:                Colonoscopy Indications:              Surveillance: Personal history of adenomatous                            polyps on last colonoscopy 5 years ago                           Last colonoscopy was 12/18/2017 and notable for                            pandiverticulosis, tubular adenoma, hyperplastic                            polyp, with recommendation to repeat in 5 years.                           Previous to that, colonoscopy in 09/2011 with                            pandiverticulosis, small cecal hyperplastic polyp,                            5-6 mm polyp in the left colon (adenoma). Medicines:                Monitored Anesthesia Care Procedure:                Pre-Anesthesia Assessment:                           - Prior to the procedure, a History and Physical                            was performed, and patient medications and                            allergies were reviewed. The patient's tolerance of                            previous anesthesia was also reviewed. The risks                            and benefits of the procedure and the sedation                            options and risks were discussed with the patient.                            All questions were answered, and informed consent  was obtained. Prior Anticoagulants: The patient has                            taken no anticoagulant or antiplatelet agents. ASA                            Grade Assessment: II - A patient with mild systemic                            disease. After reviewing the risks and benefits,                            the patient was deemed in satisfactory condition to                            undergo the  procedure.                           After obtaining informed consent, the colonoscope                            was passed under direct vision. Throughout the                            procedure, the patient's blood pressure, pulse, and                            oxygen saturations were monitored continuously. The                            CF [id number] #[id number] was introduced through the anus                            and advanced to the the cecum, identified by                            appendiceal orifice and ileocecal valve. The                            colonoscopy was performed without difficulty. The                            patient tolerated the procedure well. The quality                            of the bowel preparation was good. The ileocecal                            valve, appendiceal orifice, and rectum were                            photographed. Scope In: 1:20:03 PM Scope Out: 1:30:40 PM Scope Withdrawal Time: 0 hours 9 minutes 13 seconds  Total Procedure Duration: 0 hours 10  minutes 37 seconds  Findings:                 The perianal and digital rectal examinations were                            normal.                           Multiple large-mouthed and small-mouthed                            diverticula were found in the sigmoid colon,                            descending colon, transverse colon, ascending colon                            and cecum.                           The exam was otherwise normal throughout the                            remainder of the colon.                           The retroflexed view of the distal rectum and anal                            verge was normal and showed no anal or rectal                            abnormalities. Complications:            No immediate complications. Estimated Blood Loss:     Estimated blood loss: none. Impression:               - Diverticulosis in the sigmoid colon, in the                             descending colon, in the transverse colon, in the                            ascending colon and in the cecum.                           - The distal rectum and anal verge are normal on                            retroflexion view.                           - No specimens collected. Recommendation:           - Patient has a contact number available for  emergencies. The signs and symptoms of potential                            delayed complications were discussed with the                            patient. Return to normal activities tomorrow.                            Written discharge instructions were provided to the                            patient.                           - Resume previous diet.                           - Continue present medications.                           - Repeat colonoscopy in 7 years for surveillance.                           - Return to GI office PRN. Doristine Locks, MD 03/24/2023 1:37:35 PM

## 2023-03-27 ENCOUNTER — Telehealth: Payer: Self-pay | Admitting: *Deleted

## 2023-03-27 NOTE — Telephone Encounter (Signed)
Attempted f/u phone call. No answer. Left message. °

## 2023-03-31 ENCOUNTER — Ambulatory Visit: Payer: No Typology Code available for payment source | Admitting: Physical Medicine & Rehabilitation

## 2023-04-07 DIAGNOSIS — F1120 Opioid dependence, uncomplicated: Secondary | ICD-10-CM | POA: Diagnosis not present

## 2023-04-07 DIAGNOSIS — Z008 Encounter for other general examination: Secondary | ICD-10-CM | POA: Diagnosis not present

## 2023-04-07 DIAGNOSIS — I10 Essential (primary) hypertension: Secondary | ICD-10-CM | POA: Diagnosis not present

## 2023-04-07 DIAGNOSIS — N529 Male erectile dysfunction, unspecified: Secondary | ICD-10-CM | POA: Diagnosis not present

## 2023-04-07 DIAGNOSIS — M1990 Unspecified osteoarthritis, unspecified site: Secondary | ICD-10-CM | POA: Diagnosis not present

## 2023-04-07 DIAGNOSIS — E785 Hyperlipidemia, unspecified: Secondary | ICD-10-CM | POA: Diagnosis not present

## 2023-04-07 DIAGNOSIS — Z6837 Body mass index (BMI) 37.0-37.9, adult: Secondary | ICD-10-CM | POA: Diagnosis not present

## 2023-05-15 ENCOUNTER — Telehealth: Payer: Self-pay

## 2023-05-15 MED ORDER — OXYCODONE HCL 10 MG PO TABS: 10.0000 mg | ORAL_TABLET | Freq: Three times a day (TID) | ORAL | 0 refills | Status: DC | PRN

## 2023-05-15 NOTE — Telephone Encounter (Signed)
Patient requesting refill on oxycodone last fill per pmp:   Filled  Written  ID  Drug  QTY  Days  Prescriber  RX #  Dispenser  Refill  Daily Dose*  Pymt Type  PMP  04/17/2023 03/17/2023 1  Oxycodone Hcl (Ir) 10 Mg Tab 90.00 [address] [id number] [facility] ([id number]) 0/0 45.00 MME Medicare [place]

## 2023-06-15 ENCOUNTER — Other Ambulatory Visit: Payer: Self-pay | Admitting: Physical Medicine & Rehabilitation

## 2023-06-15 MED ORDER — OXYCODONE HCL 10 MG PO TABS: 10.0000 mg | ORAL_TABLET | Freq: Three times a day (TID) | ORAL | 0 refills | Status: DC | PRN

## 2023-06-15 NOTE — Telephone Encounter (Signed)
Patient called again for pain medication refill.

## 2023-06-16 ENCOUNTER — Other Ambulatory Visit (HOSPITAL_BASED_OUTPATIENT_CLINIC_OR_DEPARTMENT_OTHER): Payer: Self-pay

## 2023-06-16 ENCOUNTER — Ambulatory Visit (INDEPENDENT_AMBULATORY_CARE_PROVIDER_SITE_OTHER): Payer: No Typology Code available for payment source | Admitting: Family

## 2023-06-16 ENCOUNTER — Encounter: Payer: Self-pay | Admitting: Family

## 2023-06-16 ENCOUNTER — Encounter: Payer: Self-pay | Admitting: Physical Medicine & Rehabilitation

## 2023-06-16 ENCOUNTER — Encounter
Payer: No Typology Code available for payment source | Attending: Physical Medicine & Rehabilitation | Admitting: Physical Medicine & Rehabilitation

## 2023-06-16 VITALS — BP 119/81 | HR 67 | Ht 65.0 in | Wt 229.0 lb

## 2023-06-16 VITALS — BP 112/68 | HR 57 | Resp 18 | Ht 65.0 in | Wt 228.8 lb

## 2023-06-16 DIAGNOSIS — Z79891 Long term (current) use of opiate analgesic: Secondary | ICD-10-CM

## 2023-06-16 DIAGNOSIS — M25572 Pain in left ankle and joints of left foot: Secondary | ICD-10-CM | POA: Diagnosis not present

## 2023-06-16 DIAGNOSIS — G894 Chronic pain syndrome: Secondary | ICD-10-CM | POA: Diagnosis not present

## 2023-06-16 DIAGNOSIS — E119 Type 2 diabetes mellitus without complications: Secondary | ICD-10-CM | POA: Diagnosis not present

## 2023-06-16 DIAGNOSIS — M19071 Primary osteoarthritis, right ankle and foot: Secondary | ICD-10-CM | POA: Diagnosis not present

## 2023-06-16 DIAGNOSIS — E782 Mixed hyperlipidemia: Secondary | ICD-10-CM

## 2023-06-16 DIAGNOSIS — I10 Essential (primary) hypertension: Secondary | ICD-10-CM | POA: Diagnosis not present

## 2023-06-16 MED ORDER — AMLODIPINE BESYLATE 5 MG PO TABS: 5.0000 mg | ORAL_TABLET | Freq: Every day | ORAL | 3 refills | Status: DC

## 2023-06-16 MED ORDER — ATORVASTATIN CALCIUM 40 MG PO TABS: 40.0000 mg | ORAL_TABLET | Freq: Every day | ORAL | 3 refills | Status: DC

## 2023-06-16 MED ORDER — OXYCODONE HCL 10 MG PO TABS: 10.0000 mg | ORAL_TABLET | Freq: Three times a day (TID) | ORAL | 0 refills | Status: DC | PRN

## 2023-06-16 MED ORDER — METOPROLOL SUCCINATE ER 25 MG PO TB24: 25.0000 mg | ORAL_TABLET | Freq: Every day | ORAL | 3 refills | Status: DC

## 2023-06-16 MED ORDER — TIRZEPATIDE 2.5 MG/0.5ML ~~LOC~~ SOAJ: 2.5000 mg | SUBCUTANEOUS | 0 refills | Status: DC

## 2023-06-16 MED ORDER — SILDENAFIL CITRATE 100 MG PO TABS: ORAL_TABLET | ORAL | 3 refills | Status: DC

## 2023-06-16 MED ORDER — COVID-19 MRNA VAC-TRIS(PFIZER) 30 MCG/0.3ML IM SUSY
0.3000 mL | PREFILLED_SYRINGE | Freq: Once | INTRAMUSCULAR | 0 refills | Status: AC
  Filled 2023-06-16: qty 0.3, 1d supply, fill #0

## 2023-06-16 NOTE — Progress Notes (Signed)
Brandon Reyes is a 62 y.o. male with the following history as recorded in EpicCare:  Patient Active Problem List   Diagnosis Date Noted   Change in bowel habit 09/14/2021   Constipation 09/14/2021   Morbid obesity (HCC) 09/14/2021   History of colonic polyps 09/14/2021   Arthritis of left subtalar joint 10/25/2018   Newly diagnosed diabetes (HCC) 06/18/2018   Prediabetes 04/03/2018   Vitamin D deficiency 03/16/2018   Encounter for general adult medical examination with abnormal findings 03/16/2018   Sinus tachycardia 08/23/2017   Anemia 08/23/2017   Pain from implanted hardware 05/18/2017   Essential hypertension 12/29/2016   Arthritis of left ankle 11/17/2016    Current Outpatient Medications  Medication Sig Dispense Refill   Cholecalciferol (VITAMIN D3) 50 MCG (2000 UT) capsule Take 2,000 Units by mouth daily.     Cyanocobalamin 2500 MCG TABS Take 2,500 mcg by mouth every other day. Vitamin B12     Omega-3 Fatty Acids (FISH OIL PO) Take by mouth.     [START ON 07/14/2023] Oxycodone HCl 10 MG TABS Take 1 tablet (10 mg total) by mouth 3 (three) times daily as needed. Do not fill until 12/13 90 tablet 0   [START ON 08/14/2023] Oxycodone HCl 10 MG TABS Take 1 tablet (10 mg total) by mouth 3 (three) times daily as needed. Do not fill until 1/13 90 tablet 0   tirzepatide (MOUNJARO) 2.5 MG/0.5ML Pen Inject 2.5 mg into the skin once a week. 2 mL 0   amLODipine (NORVASC) 5 MG tablet Take 1 tablet (5 mg total) by mouth daily. 90 tablet 3   atorvastatin (LIPITOR) 40 MG tablet Take 1 tablet (40 mg total) by mouth daily. 90 tablet 3   COVID-19 mRNA vaccine, Pfizer, (COMIRNATY) syringe Inject 0.3 mLs into the muscle once for 1 dose. 0.3 mL 0   metoprolol succinate (TOPROL-XL) 25 MG 24 hr tablet Take 1 tablet (25 mg total) by mouth daily. 90 tablet 3   sildenafil (VIAGRA) 100 MG tablet TAKE 1 TABLET DAILY AS     NEEDED FOR ERECTILE        DYSFUNCTION 18 tablet 3   Current Facility-Administered  Medications  Medication Dose Route Frequency Provider Last Rate Last Admin   diclofenac Sodium (VOLTAREN) 1 % topical gel 4 g  4 g Topical QID Fanny Dance, MD        Allergies: Duloxetine hcl and Tylenol [acetaminophen]  Past Medical History:  Diagnosis Date   Arthritis    Hypertension     Past Surgical History:  Procedure Laterality Date   ANKLE FRACTURE SURGERY     COLONOSCOPY  2019   polyps, Dr Loreta Ave   INSERTION OF MESH N/A 09/26/2016   Procedure: INSERTION OF MESH;  Surgeon: Gaynelle Adu, MD;  Location: [facility];  Service: General;  Laterality: N/A;   UMBILICAL HERNIA REPAIR N/A 09/26/2016   Procedure: LAPAROSCOPIC ASSISTED UMBILICAL HERNIA REPAIR WITH MESH;  Surgeon: Gaynelle Adu, MD;  Location: [facility];  Service: General;  Laterality: N/A;    Family History  Problem Relation Age of Onset   Hypertension Mother    Diabetes Mother    Hypertension Sister    Hypertension Sister    Hypertension Maternal Grandmother    Colon cancer Other    Colon polyps Neg Hx    Esophageal cancer Neg Hx    Rectal cancer Neg Hx    Stomach cancer Neg Hx     Social History   Tobacco Use  Smoking status: Former    Types: Cigarettes   Smokeless tobacco: Never  Substance Use Topics   Alcohol use: Not Currently    Subjective:   Accompanied by wife; requesting to get labs updated for Type 2 Diabetes; would be interested in using medication to help with weight loss; Denies any chest pain, shortness of breath, blurred vision or headache Is continuing to work with chronic pain management specialist;   Objective:  Vitals:   06/16/23 1451  BP: 112/68  Pulse: (!) 57  Resp: 18  SpO2: 96%  Weight: 228 lb 12.8 oz (103.8 kg)  Height: 5\' 5"  (1.651 m)    General: Well developed, well nourished, in no acute distress  Skin : Warm and dry.  Head: Normocephalic and atraumatic  Lungs: Respirations unlabored; clear to auscultation bilaterally without wheeze, rales, rhonchi  CVS exam: normal rate  and regular rhythm.  Neurologic: Alert and oriented; speech intact; face symmetrical; moves all extremities well; CNII-XII intact without focal deficit   Assessment:  1. Diet-controlled diabetes mellitus (HCC)   2. Essential hypertension   3. Mixed hyperlipidemia     Plan:  Will update labs today; discussed risks/ benefits of Mounjaro- patient in agreement; follow up in 3 months, sooner prn.   No follow-ups on file.  Orders Placed This Encounter  Procedures   CBC with Differential/Platelet   Comp Met (CMET)   Lipid panel   Urine Microalbumin w/creat. ratio   Hemoglobin A1c    Requested Prescriptions   Signed Prescriptions Disp Refills   amLODipine (NORVASC) 5 MG tablet 90 tablet 3    Sig: Take 1 tablet (5 mg total) by mouth daily.   atorvastatin (LIPITOR) 40 MG tablet 90 tablet 3    Sig: Take 1 tablet (40 mg total) by mouth daily.   metoprolol succinate (TOPROL-XL) 25 MG 24 hr tablet 90 tablet 3    Sig: Take 1 tablet (25 mg total) by mouth daily.   sildenafil (VIAGRA) 100 MG tablet 18 tablet 3    Sig: TAKE 1 TABLET DAILY AS     NEEDED FOR ERECTILE        DYSFUNCTION   tirzepatide (MOUNJARO) 2.5 MG/0.5ML Pen 2 mL 0    Sig: Inject 2.5 mg into the skin once a week.

## 2023-06-16 NOTE — Progress Notes (Signed)
Subjective:    Patient ID: Brandon Reyes, male    DOB: [birth year], 61 y.o.   MRN: [id number]  HPI  HPI 01/04/22 62 year old male here with PMH of type 2 DM, HTN here for chronic left ankle pain.  He broke his ankle in 2015 when he stepped off a curb and had ORIF.   He reports he had 2 other surgeries after this including ankle arthrodesis by Dr. Lorin Picket at [facility]. He continues to have severe pain since this time. Pain is constant but worsened by walking. He walks with a cane.  Pain is sharp and achy.  Ankle brace did not help his pain.  He had ankle injections without benefit. Patient reports he takes tylenol with minimal benefit. Reports percocet helped his pain in the past. When asked if he took lyrica, gabapentin his he wasn't certain but his partner thought he used these without benefit. She said he tried tramadol and it didn't help. Lidocaine cream and patch didn't help the pain. Has not tried TENS unit but has access to TENS.   Interval History 08/04/2022 Brandon Reyes is here to follow-up regarding his chronic pain in his left ankle.  He continues to use oxycodone 10 mg 3 times daily as needed.  He reports this medicine is helping keep his pain under control.  Denies side effects with this medication.  He reports he is able to walk around his house when using the medication.  Allows him to be more active than go to stores and do other such activities with his wife.   Interval history 10/08/2022 Brandon Reyes is here for follow-up regarding his chronic ankle pain.  He reports oxycodone continues to help keep his pain tolerable.  No side effects with this medication reported.  He does report he feels like he is having some discomfort in his right ankle as well, not as severe as his left side.  He denies any significant injury to this joint.  Patient is able to walk more and do more chores around the home, go to the store with the oxycodone medication.  Patient would also like to pick upKnee-high compression  stockings for dependent edema in his legs.  Interval history 12/30/22 Brandon Reyes is here for follow-up for his left ankle pain.  He reports he is doing overall well and continues to use oxycodone.  He says this medicine helps keep his pain under control and he is able to do more than he used to do without medication.  He is still as active as he used to be when he was younger but he feels like he is able to function.  He is excited about his birthday coming up on [birth year].  He does have some occasional pain in his right ankle as well.  He says if this gets worse he will plan to see his podiatrist.  Interval history 03/17/23 Brandon Reyes is here for follow-up regarding his ankle pain.  He continues to have pain with ambulation in his left ankle.  Pain has not changed since last visit.  Oxycodone continues to help keep his pain under control.  He tries to use it when his pain is severe.  He is not having any side effects with oxycodone.  He has some pain and swelling in his left ankle today from increased ambulation.  Interval history 06/16/23 Brandon Reyes is here for follow-up regarding his left ankle pain.  Overall his pain is unchanged since prior visit.  He continues  to use oxycodone 10 mg which is helping keep his pain under control, not causing any significant side effects.  He has been using a cane for ambulation but like to try to see if he can walk without this in the home and use it only for longer distances.  He also has pain in his right ankle primarily when ambulating for longer distances.  Pain Inventory Average Pain 7 Pain Right Now 8 My pain is intermittent, sharp, stabbing, and tingling  In the last 24 hours, has pain interfered with the following? General activity 3 Relation with others 3 Enjoyment of life 3 What TIME of day is your pain at its worst? morning  Sleep (in general) Fair  Pain is worse with: walking and bending Pain improves with: heat/ice and medication Relief from Meds:  5  Family History  Problem Relation Age of Onset   Hypertension Mother    Diabetes Mother    Hypertension Sister    Hypertension Sister    Hypertension Maternal Grandmother    Colon cancer Other    Colon polyps Neg Hx    Esophageal cancer Neg Hx    Rectal cancer Neg Hx    Stomach cancer Neg Hx    Social History   Socioeconomic History   Marital status: Married    Spouse name: Not on file   Number of children: 0   Years of education: 12   Highest education level: Not on file  Occupational History   Occupation: Unemployed  Tobacco Use   Smoking status: Former    Types: Cigarettes   Smokeless tobacco: Never  Vaping Use   Vaping status: Never Used  Substance and Sexual Activity   Alcohol use: Not Currently   Drug use: Not Currently   Sexual activity: Yes  Other Topics Concern   Not on file  Social History Narrative    Fun/Hobby - Travel    Lives with Wife   Social Determinants of Health   Financial Resource Strain: Low Risk  (11/18/2022)   Overall Financial Resource Strain (CARDIA)    Difficulty of Paying Living Expenses: Not hard at all  Food Insecurity: No Food Insecurity (11/18/2022)   Hunger Vital Sign    Worried About Running Out of Food in the Last Year: Never true    Ran Out of Food in the Last Year: Never true  Transportation Needs: No Transportation Needs (11/18/2022)   PRAPARE - Administrator, Civil Service (Medical): No    Lack of Transportation (Non-Medical): No  Physical Activity: Inactive (11/18/2022)   Exercise Vital Sign    Days of Exercise per Week: 0 days    Minutes of Exercise per Session: 0 min  Stress: No Stress Concern Present (11/18/2022)   [employer] of Occupational Health - Occupational Stress Questionnaire    Feeling of Stress : Not at all  Social Connections: Socially Integrated (11/18/2022)   Social Connection and Isolation Panel [NHANES]    Frequency of Communication with Friends and Family: More than three times a  week    Frequency of Social Gatherings with Friends and Family: More than three times a week    Attends Religious Services: More than 4 times per year    Active Member of [employer] or Organizations: Yes    Attends Engineer, structural: More than 4 times per year    Marital Status: Married   Past Surgical History:  Procedure Laterality Date   ANKLE FRACTURE SURGERY  COLONOSCOPY  2019   polyps, Dr Loreta Ave   INSERTION OF MESH N/A 09/26/2016   Procedure: INSERTION OF MESH;  Surgeon: Gaynelle Adu, MD;  Location: [facility];  Service: General;  Laterality: N/A;   UMBILICAL HERNIA REPAIR N/A 09/26/2016   Procedure: LAPAROSCOPIC ASSISTED UMBILICAL HERNIA REPAIR WITH MESH;  Surgeon: Gaynelle Adu, MD;  Location: [facility];  Service: General;  Laterality: N/A;   Past Surgical History:  Procedure Laterality Date   ANKLE FRACTURE SURGERY     COLONOSCOPY  2019   polyps, Dr Loreta Ave   INSERTION OF MESH N/A 09/26/2016   Procedure: INSERTION OF MESH;  Surgeon: Gaynelle Adu, MD;  Location: [facility];  Service: General;  Laterality: N/A;   UMBILICAL HERNIA REPAIR N/A 09/26/2016   Procedure: LAPAROSCOPIC ASSISTED UMBILICAL HERNIA REPAIR WITH MESH;  Surgeon: Gaynelle Adu, MD;  Location: [facility];  Service: General;  Laterality: N/A;   Past Medical History:  Diagnosis Date   Arthritis    Hypertension    BP 119/81   Pulse 67   Ht 5\' 5"  (1.651 m)   Wt 229 lb (103.9 kg)   SpO2 95%   BMI 38.11 kg/m   Opioid Risk Score:   Fall Risk Score:  `1  Depression screen PHQ 2/9     06/16/2023    1:06 PM 03/17/2023    1:06 PM 11/18/2022    4:01 PM 05/13/2022    3:00 PM 03/25/2022    2:52 PM 03/25/2022    2:49 PM 02/04/2022   10:26 AM  Depression screen PHQ 2/9  Decreased Interest 0 0 0 0 0 0 0  Down, Depressed, Hopeless 0 0 0 0 0 0 0  PHQ - 2 Score 0 0 0 0 0 0 0     Review of Systems  Constitutional: Negative.   HENT: Negative.    Eyes: Negative.   Respiratory: Negative.    Cardiovascular: Negative.    Gastrointestinal: Negative.   Endocrine: Negative.   Genitourinary: Negative.   Musculoskeletal:        B/L foot pain  Allergic/Immunologic: Negative.   Neurological: Negative.   Hematological: Negative.   Psychiatric/Behavioral: Negative.    All other systems reviewed and are negative.      Objective:   Physical Exam   Gen: no distress, normal appearing HEENT: oral mucosa pink and moist, NCAT Chest: normal effort, normal rate of breathing Abd:  non-distended Psych: pleasant, appropriate Skin: intact Neuro: Awake and alert CN 2-12 grossly intact, no ataxia noted, follows commands Musculoskeletal no focal motor deficits. Minimal ROM at L ankle Sensation intact all 4 extremities L foot with well healed surgical incisions on anterior and lateral ankle Positive for left ankle swelling- Mild tenderness at medial and lateral left ankle, mild tenderness with ankle inversion and eversion -unchanged No significant swelling R ankle, no significant pain with plantar flexion and dorsiflexion passively, passive inversion or eversion.  Minimal medial and lateral ankle tenderness of the right ankle          Assessment & Plan:    Chronic L ankle pain after ankle fracture and multiple surgeries -s/p ORIF and later ankle arthrodesis, seen by podiatry without any further procedural options -Reports poor results with multiple other medications and treatments             -He was unable to tolerate duloxetine              -Continue oxycodone 10 mg TID, reports keeping pain controlled, reordered today for  next 2 refills             -Advised trying TENS unit             -Continue monitoring random UDS, pill counts, PDMP -Pill count is consistent today 11/15  Right ankle pain -Pain mostly with ambulation.  Reports he had x-rays done by orthopedic doctor recently?  He said he was told he had a ankle sprain previously -X-rays in 2022 with medial OA changes noted.  I do think altered gait due  to his left ankle may be contributing to OA of his right ankle.  Alcohol use history -Continue to reinforce abstinence from alcohol and illicit drugs -Continue UDS monitoring

## 2023-06-17 LAB — CBC WITH DIFFERENTIAL/PLATELET
Absolute Lymphocytes: 3052 {cells}/uL (ref 850–3900)
Absolute Monocytes: 850 {cells}/uL (ref 200–950)
Basophils Absolute: 153 {cells}/uL (ref 0–200)
Basophils Relative: 1.4 %
Eosinophils Absolute: 578 {cells}/uL — ABNORMAL HIGH (ref 15–500)
Eosinophils Relative: 5.3 %
HCT: 44.1 % (ref 38.5–50.0)
Hemoglobin: 14.1 g/dL (ref 13.2–17.1)
MCH: 28.7 pg (ref 27.0–33.0)
MCHC: 32 g/dL (ref 32.0–36.0)
MCV: 89.6 fL (ref 80.0–100.0)
MPV: 10.1 fL (ref 7.5–12.5)
Monocytes Relative: 7.8 %
Neutro Abs: 6268 {cells}/uL (ref 1500–7800)
Neutrophils Relative %: 57.5 %
Platelets: 215 10*3/uL (ref 140–400)
RBC: 4.92 10*6/uL (ref 4.20–5.80)
RDW: 12.4 % (ref 11.0–15.0)
Total Lymphocyte: 28 %
WBC: 10.9 10*3/uL — ABNORMAL HIGH (ref 3.8–10.8)

## 2023-06-17 LAB — LIPID PANEL
Cholesterol: 109 mg/dL (ref <200)
HDL: 36 mg/dL — ABNORMAL LOW (ref >=40)
LDL Cholesterol (Calc): 58 mg/dL
Non-HDL Cholesterol (Calc): 73 mg/dL (ref <130)
Total CHOL/HDL Ratio: 3 (calc) (ref <5.0)
Triglycerides: 71 mg/dL (ref <150)

## 2023-06-17 LAB — COMPREHENSIVE METABOLIC PANEL
AG Ratio: 2.1 (calc) (ref 1.0–2.5)
ALT: 18 U/L (ref 9–46)
AST: 19 U/L (ref 10–35)
Albumin: 3.2 g/dL — ABNORMAL LOW (ref 3.6–5.1)
Alkaline phosphatase (APISO): 37 U/L (ref 35–144)
BUN: 16 mg/dL (ref 7–25)
CO2: 30 mmol/L (ref 20–32)
Calcium: 8.5 mg/dL — ABNORMAL LOW (ref 8.6–10.3)
Chloride: 105 mmol/L (ref 98–110)
Creat: 1.04 mg/dL (ref 0.70–1.35)
Globulin: 1.5 g/dL — ABNORMAL LOW (ref 1.9–3.7)
Glucose, Bld: 95 mg/dL (ref 65–99)
Potassium: 4.4 mmol/L (ref 3.5–5.3)
Sodium: 139 mmol/L (ref 135–146)
Total Bilirubin: 0.3 mg/dL (ref 0.2–1.2)
Total Protein: 4.7 g/dL — ABNORMAL LOW (ref 6.1–8.1)

## 2023-06-17 LAB — HEMOGLOBIN A1C
Hgb A1c MFr Bld: 6.3 %{Hb} — ABNORMAL HIGH (ref <5.7)
Mean Plasma Glucose: 134 mg/dL
eAG (mmol/L): 7.4 mmol/L

## 2023-06-17 LAB — MICROALBUMIN / CREATININE URINE RATIO
Creatinine, Urine: 200 mg/dL (ref 20–320)
Microalb, Ur: <0.2 mg/dL

## 2023-08-17 NOTE — Progress Notes (Signed)
Subjective:    Patient ID: Brandon Reyes, male    DOB: July 24, 1961, 63 y.o.   MRN: [id number]  HPI: Brandon Reyes is a 63 y.o. male who returns for follow up appointment for chronic pain and medication refill. He states his  pain is located in his right ankle, left foot and left ankle. He rates his pain 7. His current exercise regime is walking and performing stretching exercises.  Brandon Reyes Morphine equivalent is 45.00 MME.   UDS ordered Tpoday.      Pain Inventory Average Pain 7 Pain Right Now 7 My pain is intermittent, constant, sharp, burning, and aching  In the last 24 hours, has pain interfered with the following? General activity 7 Relation with others 0 Enjoyment of life 0 What TIME of day is your pain at its worst? night and varies Sleep (in general) Good  Pain is worse with: standing Pain improves with: rest and medication Relief from Meds: 7  Family History  Problem Relation Age of Onset   Hypertension Mother    Diabetes Mother    Hypertension Sister    Hypertension Sister    Hypertension Maternal Grandmother    Colon cancer Other    Colon polyps Neg Hx    Esophageal cancer Neg Hx    Rectal cancer Neg Hx    Stomach cancer Neg Hx    Social History   Socioeconomic History   Marital status: Married    Spouse name: Not on file   Number of children: 0   Years of education: 12   Highest education level: Not on file  Occupational History   Occupation: Unemployed  Tobacco Use   Smoking status: Former    Types: Cigarettes   Smokeless tobacco: Never  Vaping Use   Vaping status: Never Used  Substance and Sexual Activity   Alcohol use: Not Currently   Drug use: Not Currently   Sexual activity: Yes  Other Topics Concern   Not on file  Social History Narrative    Fun/Hobby - Travel    Lives with Wife   Social Drivers of Health   Financial Resource Strain: Low Risk  (11/18/2022)   Overall Financial Resource Strain (CARDIA)    Difficulty of Paying Living  Expenses: Not hard at all  Food Insecurity: No Food Insecurity (11/18/2022)   Hunger Vital Sign    Worried About Running Out of Food in the Last Year: Never true    Ran Out of Food in the Last Year: Never true  Transportation Needs: No Transportation Needs (11/18/2022)   PRAPARE - Administrator, Civil Service (Medical): No    Lack of Transportation (Non-Medical): No  Physical Activity: Inactive (11/18/2022)   Exercise Vital Sign    Days of Exercise per Week: 0 days    Minutes of Exercise per Session: 0 min  Stress: No Stress Concern Present (11/18/2022)   [employer] of Occupational Health - Occupational Stress Questionnaire    Feeling of Stress : Not at all  Social Connections: Socially Integrated (11/18/2022)   Social Connection and Isolation Panel [NHANES]    Frequency of Communication with Friends and Family: More than three times a week    Frequency of Social Gatherings with Friends and Family: More than three times a week    Attends Religious Services: More than 4 times per year    Active Member of [employer] or Organizations: Yes    Attends Banker Meetings: More than 4 times per year  Marital Status: Married   Past Surgical History:  Procedure Laterality Date   ANKLE FRACTURE SURGERY     COLONOSCOPY  2019   polyps, Dr Loreta Ave   INSERTION OF MESH N/A 09/26/2016   Procedure: INSERTION OF MESH;  Surgeon: Gaynelle Adu, MD;  Location: [facility];  Service: General;  Laterality: N/A;   UMBILICAL HERNIA REPAIR N/A 09/26/2016   Procedure: LAPAROSCOPIC ASSISTED UMBILICAL HERNIA REPAIR WITH MESH;  Surgeon: Gaynelle Adu, MD;  Location: [facility];  Service: General;  Laterality: N/A;   Past Surgical History:  Procedure Laterality Date   ANKLE FRACTURE SURGERY     COLONOSCOPY  2019   polyps, Dr Loreta Ave   INSERTION OF MESH N/A 09/26/2016   Procedure: INSERTION OF MESH;  Surgeon: Gaynelle Adu, MD;  Location: [facility];  Service: General;  Laterality: N/A;   UMBILICAL HERNIA  REPAIR N/A 09/26/2016   Procedure: LAPAROSCOPIC ASSISTED UMBILICAL HERNIA REPAIR WITH MESH;  Surgeon: Gaynelle Adu, MD;  Location: [facility];  Service: General;  Laterality: N/A;   Past Medical History:  Diagnosis Date   Arthritis    Hypertension    There were no vitals taken for this visit.  Opioid Risk Score:   Fall Risk Score:  `1  Depression screen PHQ 2/9     06/16/2023    3:01 PM 06/16/2023    1:06 PM 03/17/2023    1:06 PM 11/18/2022    4:01 PM 05/13/2022    3:00 PM 03/25/2022    2:52 PM 03/25/2022    2:49 PM  Depression screen PHQ 2/9  Decreased Interest 1 0 0 0 0 0 0  Down, Depressed, Hopeless 0 0 0 0 0 0 0  PHQ - 2 Score 1 0 0 0 0 0 0    Review of Systems  Musculoskeletal:        Pain in both ankles  All other systems reviewed and are negative.      Objective:   Physical Exam Vitals and nursing note reviewed.  Constitutional:      Appearance: Normal appearance.  Cardiovascular:     Rate and Rhythm: Normal rate and regular rhythm.     Pulses: Normal pulses.     Heart sounds: Normal heart sounds.  Pulmonary:     Effort: Pulmonary effort is normal.     Breath sounds: Normal breath sounds.  Musculoskeletal:     Comments: Normal Muscle Bulk and Muscle Testing Reveals:  Upper Extremities: Full ROM and Muscle Strength  5/5  Lower Extremities: Right: Full ROM and Muscle Strength 5/5 Left Lower Extremity: Decreased ROM and Muscle Strength 5/5 Left Lower Extremity Flexion Produces Pain into his Left Ankle Arises from Chair with Ease Narrow Base d Gait     Skin:    General: Skin is warm and dry.  Neurological:     Mental Status: He is alert and oriented to person, place, and time.  Psychiatric:        Mood and Affect: Mood normal.        Behavior: Behavior normal.         Assessment & Plan:  Chronic Right Ankle Pain: Continue HEP as Tolerated. Continue to Monitor.  Chronic Left Ankle and Left Foot Pain: Continue HEP as Tolerated. Continue to Monitor.   Chronic Pain Syndrome: Refilled: Oxycodone 10 mg one tablet three times a day as needed for pain #90. Second script sent for the following month. We will continue the opioid monitoring program, this consists of regular clinic visits,  examinations, urine drug screen, pill counts as well as use of [place] Controlled Substance Reporting system. A 12 month History has been reviewed on the [place] Controlled Substance Reporting System on 08/18/2023.  F/U in 2 months

## 2023-08-18 ENCOUNTER — Encounter: Payer: Self-pay | Admitting: Registered Nurse

## 2023-08-18 ENCOUNTER — Encounter
Payer: No Typology Code available for payment source | Attending: Physical Medicine & Rehabilitation | Admitting: Registered Nurse

## 2023-08-18 VITALS — HR 2 | Ht 65.0 in | Wt 231.0 lb

## 2023-08-18 DIAGNOSIS — Z79891 Long term (current) use of opiate analgesic: Secondary | ICD-10-CM | POA: Insufficient documentation

## 2023-08-18 DIAGNOSIS — Z5181 Encounter for therapeutic drug level monitoring: Secondary | ICD-10-CM | POA: Insufficient documentation

## 2023-08-18 DIAGNOSIS — M25571 Pain in right ankle and joints of right foot: Secondary | ICD-10-CM | POA: Diagnosis not present

## 2023-08-18 DIAGNOSIS — G894 Chronic pain syndrome: Secondary | ICD-10-CM | POA: Diagnosis present

## 2023-08-18 DIAGNOSIS — M25572 Pain in left ankle and joints of left foot: Secondary | ICD-10-CM | POA: Insufficient documentation

## 2023-08-18 DIAGNOSIS — M19071 Primary osteoarthritis, right ankle and foot: Secondary | ICD-10-CM | POA: Diagnosis not present

## 2023-08-18 DIAGNOSIS — G8929 Other chronic pain: Secondary | ICD-10-CM | POA: Insufficient documentation

## 2023-08-18 MED ORDER — OXYCODONE HCL 10 MG PO TABS: 10.0000 mg | ORAL_TABLET | Freq: Three times a day (TID) | ORAL | 0 refills | Status: DC | PRN

## 2023-08-23 LAB — TOXASSURE SELECT,+ANTIDEPR,UR

## 2023-08-30 ENCOUNTER — Other Ambulatory Visit: Payer: Self-pay | Admitting: Family

## 2023-08-30 NOTE — Telephone Encounter (Signed)
Refills already on file

## 2023-08-30 NOTE — Telephone Encounter (Signed)
Copied from CRM [id number]. Topic: Clinical - Medication Refill >> Aug 30, 2023  2:11 PM Almira Coaster wrote: Most Recent Primary Care Visit:  Provider: Ria Clock [facility]  Department: [facility]  Visit Type: PHYSICAL  Date: 06/16/2023  Medication: sildenafil (VIAGRA) 100 MG tablet   Has the patient contacted their pharmacy? Yes, asked patient to contact ordering provider (Agent: If no, request that the patient contact the pharmacy for the refill. If patient does not wish to contact the pharmacy document the reason why and proceed with request.) (Agent: If yes, when and what did the pharmacy advise?)  Is this the correct pharmacy for this prescription? Yes If no, delete pharmacy and type the correct one.  This is the patient's preferred pharmacy:  [id number] [place], [place] - [phone] AT [facility] & [address] [place] [address] Phone: [phone] Fax: [phone]    Has the prescription been filled recently? No  Is the patient out of the medication? Yes  Has the patient been seen for an appointment in the last year OR does the patient have an upcoming appointment? Yes  Can we respond through MyChart? Yes  Agent: Please be advised that Rx refills may take up to 3 business days. We ask that you follow-up with your pharmacy.

## 2023-10-02 ENCOUNTER — Other Ambulatory Visit: Payer: Self-pay | Admitting: *Deleted

## 2023-10-02 MED ORDER — SILDENAFIL CITRATE 100 MG PO TABS: ORAL_TABLET | ORAL | 3 refills | Status: AC

## 2023-10-05 ENCOUNTER — Other Ambulatory Visit (HOSPITAL_COMMUNITY): Payer: Self-pay

## 2023-10-05 ENCOUNTER — Telehealth: Payer: Self-pay

## 2023-10-05 NOTE — Telephone Encounter (Signed)
 Pharmacy Patient Advocate Encounter  Insurance verification completed.   The patient is insured through [facility]   Ran test claim for Sildenafil Citrate 100MG  tablets. Currently a quantity of 6 is a 30 day supply and the co-pay is 2.93.  No PA needed at this time.  SPOKE TO [facility] AND PT HAS PICK UP  ON 10/03/2023

## 2023-10-10 ENCOUNTER — Telehealth: Payer: Self-pay | Admitting: Family

## 2023-10-10 NOTE — Telephone Encounter (Signed)
 We received notice from insurance company that Sildenafil is no longer covered by his insurance. The alternative is Tadalafil ( Cialis). However, it also looks like PA team got the Sildenafil covered for him. Please ask him to clarify if he needs [place] to make medication change or was he able to get medication?

## 2023-10-11 NOTE — Telephone Encounter (Signed)
 Mychart message has been sent to pt.

## 2023-10-11 NOTE — Telephone Encounter (Signed)
 Pt called back and stated he was able to pick his medication up.

## 2023-10-11 NOTE — Telephone Encounter (Signed)
 Copied from CRM [id number]. Topic: General - Call Back - No Documentation >> Oct 11, 2023  1:29 PM Adele Barthel wrote: Reason for CRM:   Patient is returning call from [employer]. Contacted [employer] and was advised the CMA was calling to verify he had been able to pick up the sildenafil without issue. Spoke with patient who did verify he was able to pick up sildenafil without an issue.

## 2023-10-11 NOTE — Telephone Encounter (Signed)
 Called pt and left a VM advising pt to give the office a call back.

## 2023-10-26 NOTE — Progress Notes (Unsigned)
 Subjective:    Patient ID: Brandon Reyes, male    DOB: April 21, 1961, 63 y.o.   MRN: [id number]  HPI: Brandon Reyes is a 63 y.o. male who returns for follow up appointment for chronic pain and medication refill. He states his pain is located in his bilateral ankles R>L. He rates  his pain 7. His current exercise regime is walking and performing stretching exercises.  Mr. Toruno Morphine equivalent is 45.00 MME.      Last UDS was Performed on 08/18/2023, it was consistent.     Pain Inventory Average Pain 7 Pain Right Now 7 My pain is intermittent and tingling  In the last 24 hours, has pain interfered with the following? General activity 7 Relation with others 0 Enjoyment of life 0 What TIME of day is your pain at its worst? night and varies Sleep (in general) Fair  Pain is worse with: walking and standing Pain improves with: rest and medication Relief from Meds: 7  Family History  Problem Relation Age of Onset   Hypertension Mother    Diabetes Mother    Hypertension Sister    Hypertension Sister    Hypertension Maternal Grandmother    Colon cancer Other    Colon polyps Neg Hx    Esophageal cancer Neg Hx    Rectal cancer Neg Hx    Stomach cancer Neg Hx    Social History   Socioeconomic History   Marital status: Married    Spouse name: Not on file   Number of children: 0   Years of education: 12   Highest education level: Not on file  Occupational History   Occupation: Unemployed  Tobacco Use   Smoking status: Former    Types: Cigarettes   Smokeless tobacco: Never  Vaping Use   Vaping status: Never Used  Substance and Sexual Activity   Alcohol use: Not Currently   Drug use: Not Currently   Sexual activity: Yes  Other Topics Concern   Not on file  Social History Narrative    Fun/Hobby - Travel    Lives with Wife   Social Drivers of Health   Financial Resource Strain: Low Risk  (11/18/2022)   Overall Financial Resource Strain (CARDIA)    Difficulty of  Paying Living Expenses: Not hard at all  Food Insecurity: No Food Insecurity (11/18/2022)   Hunger Vital Sign    Worried About Running Out of Food in the Last Year: Never true    Ran Out of Food in the Last Year: Never true  Transportation Needs: No Transportation Needs (11/18/2022)   PRAPARE - Administrator, Civil Service (Medical): No    Lack of Transportation (Non-Medical): No  Physical Activity: Inactive (11/18/2022)   Exercise Vital Sign    Days of Exercise per Week: 0 days    Minutes of Exercise per Session: 0 min  Stress: No Stress Concern Present (11/18/2022)   [employer] of Occupational Health - Occupational Stress Questionnaire    Feeling of Stress : Not at all  Social Connections: Socially Integrated (11/18/2022)   Social Connection and Isolation Panel [NHANES]    Frequency of Communication with Friends and Family: More than three times a week    Frequency of Social Gatherings with Friends and Family: More than three times a week    Attends Religious Services: More than 4 times per year    Active Member of [employer] or Organizations: Yes    Attends Banker Meetings: More than 4  times per year    Marital Status: Married   Past Surgical History:  Procedure Laterality Date   ANKLE FRACTURE SURGERY     COLONOSCOPY  2019   polyps, Dr Loreta Ave   INSERTION OF MESH N/A 09/26/2016   Procedure: INSERTION OF MESH;  Surgeon: Gaynelle Adu, MD;  Location: [facility];  Service: General;  Laterality: N/A;   UMBILICAL HERNIA REPAIR N/A 09/26/2016   Procedure: LAPAROSCOPIC ASSISTED UMBILICAL HERNIA REPAIR WITH MESH;  Surgeon: Gaynelle Adu, MD;  Location: [facility];  Service: General;  Laterality: N/A;   Past Surgical History:  Procedure Laterality Date   ANKLE FRACTURE SURGERY     COLONOSCOPY  2019   polyps, Dr Loreta Ave   INSERTION OF MESH N/A 09/26/2016   Procedure: INSERTION OF MESH;  Surgeon: Gaynelle Adu, MD;  Location: [facility];  Service: General;  Laterality: N/A;    UMBILICAL HERNIA REPAIR N/A 09/26/2016   Procedure: LAPAROSCOPIC ASSISTED UMBILICAL HERNIA REPAIR WITH MESH;  Surgeon: Gaynelle Adu, MD;  Location: [facility];  Service: General;  Laterality: N/A;   Past Medical History:  Diagnosis Date   Arthritis    Hypertension    BP 127/84   Pulse 75   Ht 5\' 5"  (1.651 m)   Wt 221 lb 12.8 oz (100.6 kg)   SpO2 98%   BMI 36.91 kg/m   Opioid Risk Score:   Fall Risk Score:  `1  Depression screen PHQ 2/9     10/27/2023    1:08 PM 08/18/2023   12:53 PM 06/16/2023    3:01 PM 06/16/2023    1:06 PM 03/17/2023    1:06 PM 11/18/2022    4:01 PM 05/13/2022    3:00 PM  Depression screen PHQ 2/9  Decreased Interest 0 0 1 0 0 0 0  Down, Depressed, Hopeless 0 0 0 0 0 0 0  PHQ - 2 Score 0 0 1 0 0 0 0     Review of Systems  All other systems reviewed and are negative.      Objective:   Physical Exam Vitals and nursing note reviewed.  Constitutional:      Appearance: Normal appearance.  Cardiovascular:     Rate and Rhythm: Normal rate and regular rhythm.     Pulses: Normal pulses.     Heart sounds: Normal heart sounds.  Pulmonary:     Effort: Pulmonary effort is normal.     Breath sounds: Normal breath sounds.  Musculoskeletal:     Comments: Normal Muscle Bulk and Muscle Testing Reveals:  Upper Extremities: Full ROM and Muscle Strength 5/5 Lower Extremities: Full ROM and Muscle Strength 5/5 Arises from Table slowly Narrow Based  Gait     Skin:    General: Skin is warm and dry.  Neurological:     Mental Status: He is alert and oriented to person, place, and time.  Psychiatric:        Mood and Affect: Mood normal.        Behavior: Behavior normal.           Assessment & Plan:  Chronic Right Ankle Pain: OA Right Ankle : Continue HEP as Tolerated.  Continue current medication regimen. Continue to Monitor. 10/27/2023 Chronic Left Ankle and Left Foot Pain: Continue HEP as Tolerated. Continue to Monitor. 10/27/2023 Chronic Pain Syndrome:  Refilled: Oxycodone 10 mg one tablet three times a day as needed for pain #90. Second script sent for the following month. We will continue the opioid monitoring program, this  consists of regular clinic visits, examinations, urine drug screen, pill counts as well as use of [place] Controlled Substance Reporting system. A 12 month History has been reviewed on the [place] Controlled Substance Reporting System on 10/27/2023.   F/U in 2 months

## 2023-10-27 ENCOUNTER — Encounter
Payer: No Typology Code available for payment source | Attending: Physical Medicine & Rehabilitation | Admitting: Registered Nurse

## 2023-10-27 ENCOUNTER — Encounter: Payer: Self-pay | Admitting: Registered Nurse

## 2023-10-27 VITALS — BP 127/84 | HR 75 | Ht 65.0 in | Wt 221.8 lb

## 2023-10-27 DIAGNOSIS — Z5181 Encounter for therapeutic drug level monitoring: Secondary | ICD-10-CM | POA: Diagnosis not present

## 2023-10-27 DIAGNOSIS — M19071 Primary osteoarthritis, right ankle and foot: Secondary | ICD-10-CM | POA: Diagnosis not present

## 2023-10-27 DIAGNOSIS — M25572 Pain in left ankle and joints of left foot: Secondary | ICD-10-CM | POA: Diagnosis present

## 2023-10-27 DIAGNOSIS — E1169 Type 2 diabetes mellitus with other specified complication: Secondary | ICD-10-CM | POA: Diagnosis not present

## 2023-10-27 DIAGNOSIS — I10 Essential (primary) hypertension: Secondary | ICD-10-CM | POA: Diagnosis not present

## 2023-10-27 DIAGNOSIS — M25571 Pain in right ankle and joints of right foot: Secondary | ICD-10-CM | POA: Insufficient documentation

## 2023-10-27 DIAGNOSIS — E785 Hyperlipidemia, unspecified: Secondary | ICD-10-CM | POA: Diagnosis not present

## 2023-10-27 DIAGNOSIS — Z79891 Long term (current) use of opiate analgesic: Secondary | ICD-10-CM | POA: Insufficient documentation

## 2023-10-27 DIAGNOSIS — G894 Chronic pain syndrome: Secondary | ICD-10-CM | POA: Diagnosis not present

## 2023-10-27 DIAGNOSIS — Z6837 Body mass index (BMI) 37.0-37.9, adult: Secondary | ICD-10-CM | POA: Diagnosis not present

## 2023-10-27 DIAGNOSIS — R2681 Unsteadiness on feet: Secondary | ICD-10-CM | POA: Diagnosis not present

## 2023-10-27 DIAGNOSIS — G8929 Other chronic pain: Secondary | ICD-10-CM | POA: Diagnosis present

## 2023-10-27 DIAGNOSIS — Z008 Encounter for other general examination: Secondary | ICD-10-CM | POA: Diagnosis not present

## 2023-10-27 MED ORDER — OXYCODONE HCL 10 MG PO TABS: 10.0000 mg | ORAL_TABLET | Freq: Three times a day (TID) | ORAL | 0 refills | Status: DC | PRN

## 2023-11-13 LAB — MICROALBUMIN / CREATININE URINE RATIO: Microalb Creat Ratio: 12

## 2023-12-13 ENCOUNTER — Telehealth: Payer: Self-pay

## 2023-12-13 NOTE — Telephone Encounter (Signed)
 [place] Oxycodone  approved 09/07/23-12/05/24

## 2023-12-28 ENCOUNTER — Ambulatory Visit: Payer: No Typology Code available for payment source | Admitting: Physical Medicine & Rehabilitation

## 2023-12-29 ENCOUNTER — Encounter
Payer: No Typology Code available for payment source | Attending: Physical Medicine & Rehabilitation | Admitting: Physical Medicine & Rehabilitation

## 2023-12-29 ENCOUNTER — Encounter: Payer: Self-pay | Admitting: Physical Medicine & Rehabilitation

## 2023-12-29 VITALS — BP 115/72 | HR 70 | Ht 65.0 in

## 2023-12-29 DIAGNOSIS — M25571 Pain in right ankle and joints of right foot: Secondary | ICD-10-CM

## 2023-12-29 DIAGNOSIS — Z79891 Long term (current) use of opiate analgesic: Secondary | ICD-10-CM | POA: Diagnosis not present

## 2023-12-29 DIAGNOSIS — G8929 Other chronic pain: Secondary | ICD-10-CM

## 2023-12-29 DIAGNOSIS — Z5181 Encounter for therapeutic drug level monitoring: Secondary | ICD-10-CM

## 2023-12-29 MED ORDER — OXYCODONE HCL 10 MG PO TABS: 10.0000 mg | ORAL_TABLET | Freq: Three times a day (TID) | ORAL | 0 refills | Status: DC | PRN

## 2023-12-29 MED ORDER — GABAPENTIN 100 MG PO CAPS: 100.0000 mg | ORAL_CAPSULE | Freq: Three times a day (TID) | ORAL | 3 refills | Status: DC

## 2023-12-29 NOTE — Progress Notes (Signed)
 Subjective:    Patient ID: Brandon Reyes, male    DOB: January 10, 1961, 63 y.o.   MRN: [id number]  HPI:    HPI 01/04/22 63 year old male here with PMH of type 2 DM, HTN here for chronic left ankle pain.  He broke his ankle in 2015 when he stepped off a curb and had ORIF.   He reports he had 2 other surgeries after this including ankle arthrodesis by Dr. Geralyn Knee at [facility]. He continues to have severe pain since this time. Pain is constant but worsened by walking. He walks with a cane.  Pain is sharp and achy.  Ankle brace did not help his pain.  He had ankle injections without benefit. Patient reports he takes tylenol  with minimal benefit. Reports percocet helped his pain in the past. When asked if he took lyrica, gabapentin  his he wasn't certain but his partner thought he used these without benefit. She said he tried tramadol  and it didn't help. Lidocaine  cream and patch didn't help the pain. Has not tried TENS unit but has access to TENS.   Interval History 08/04/2022 Brandon Reyes is here to follow-up regarding his chronic pain in his left ankle.  He continues to use oxycodone  10 mg 3 times daily as needed.  He reports this medicine is helping keep his pain under control.  Denies side effects with this medication.  He reports he is able to walk around his house when using the medication.  Allows him to be more active than go to stores and do other such activities with his wife.   Interval history 10/08/2022 Brandon Reyes is here for follow-up regarding his chronic ankle pain.  He reports oxycodone  continues to help keep his pain tolerable.  No side effects with this medication reported.  He does report he feels like he is having some discomfort in his right ankle as well, not as severe as his left side.  He denies any significant injury to this joint.  Patient is able to walk more and do more chores around the home, go to the store with the oxycodone  medication.  Patient would also like to pick upKnee-high compression  stockings for dependent edema in his legs.   Interval history 12/30/22 Brandon Reyes is here for follow-up for his left ankle pain.  He reports he is doing overall well and continues to use oxycodone .  He says this medicine helps keep his pain under control and he is able to do more than he used to do without medication.  He is still as active as he used to be when he was younger but he feels like he is able to function.  He is excited about his birthday coming up on [birth year].  He does have some occasional pain in his right ankle as well.  He says if this gets worse he will plan to see his podiatrist.   Interval history 03/17/23 Brandon Reyes is here for follow-up regarding his ankle pain.  He continues to have pain with ambulation in his left ankle.  Pain has not changed since last visit.  Oxycodone  continues to help keep his pain under control.  He tries to use it when his pain is severe.  He is not having any side effects with oxycodone .  He has some pain and swelling in his left ankle today from increased ambulation.   Interval history 06/16/23 Brandon Reyes is here for follow-up regarding his left ankle pain.  Overall his pain is unchanged since  prior visit.  He continues to use oxycodone  10 mg which is helping keep his pain under control, not causing any significant side effects.  He has been using a cane for ambulation but like to try to see if he can walk without this in the home and use it only for longer distances.  He also has pain in his right ankle primarily when ambulating for longer distances.  Interval history 12/29/23 Pt last seen by Ford Ide.  Ankle pain in his ankles  L>R controlled with current dose oxycodone  and no side effects with medication. Medication helps him to stay active.  Over past few months he has been having burning pain tingling and altered sensation in his b/l feet left > right.  Hx of prediabetes vs diabetes. Pain is worse at night.    DM neuropathy Burining/numbn and tinling  in his feet, worse at night  Pain Inventory Average Pain 8 Pain Right Now 8 My pain is intermittent, sharp, stabbing, and tingling  In the last 24 hours, has pain interfered with the following? General activity 5 Relation with others 6 Enjoyment of life 5 What TIME of day is your pain at its worst? morning , daytime, evening, night, and varies Sleep (in general) Fair  Pain is worse with: walking Pain improves with: medication Relief from Meds: fair  Family History  Problem Relation Age of Onset   Hypertension Mother    Diabetes Mother    Hypertension Sister    Hypertension Sister    Hypertension Maternal Grandmother    Colon cancer Other    Colon polyps Neg Hx    Esophageal cancer Neg Hx    Rectal cancer Neg Hx    Stomach cancer Neg Hx    Social History   Socioeconomic History   Marital status: Married    Spouse name: Not on file   Number of children: 0   Years of education: 12   Highest education level: Not on file  Occupational History   Occupation: Unemployed  Tobacco Use   Smoking status: Former    Types: Cigarettes   Smokeless tobacco: Never  Vaping Use   Vaping status: Never Used  Substance and Sexual Activity   Alcohol use: Not Currently   Drug use: Not Currently   Sexual activity: Yes  Other Topics Concern   Not on file  Social History Narrative    Fun/Hobby - Travel    Lives with Wife   Social Drivers of Health   Financial Resource Strain: Low Risk  (11/18/2022)   Overall Financial Resource Strain (CARDIA)    Difficulty of Paying Living Expenses: Not hard at all  Food Insecurity: No Food Insecurity (11/18/2022)   Hunger Vital Sign    Worried About Running Out of Food in the Last Year: Never true    Ran Out of Food in the Last Year: Never true  Transportation Needs: No Transportation Needs (11/18/2022)   PRAPARE - Administrator, Civil Service (Medical): No    Lack of Transportation (Non-Medical): No  Physical Activity: Inactive  (11/18/2022)   Exercise Vital Sign    Days of Exercise per Week: 0 days    Minutes of Exercise per Session: 0 min  Stress: No Stress Concern Present (11/18/2022)   [employer] of Occupational Health - Occupational Stress Questionnaire    Feeling of Stress : Not at all  Social Connections: Socially Integrated (11/18/2022)   Social Connection and Isolation Panel [NHANES]    Frequency of  Communication with Friends and Family: More than three times a week    Frequency of Social Gatherings with Friends and Family: More than three times a week    Attends Religious Services: More than 4 times per year    Active Member of Clubs or Organizations: Yes    Attends Engineer, structural: More than 4 times per year    Marital Status: Married   Past Surgical History:  Procedure Laterality Date   ANKLE FRACTURE SURGERY     COLONOSCOPY  2019   polyps, Dr Tova Fresh   INSERTION OF MESH N/A 09/26/2016   Procedure: INSERTION OF MESH;  Surgeon: Aldean Hummingbird, MD;  Location: [facility];  Service: General;  Laterality: N/A;   UMBILICAL HERNIA REPAIR N/A 09/26/2016   Procedure: LAPAROSCOPIC ASSISTED UMBILICAL HERNIA REPAIR WITH MESH;  Surgeon: Aldean Hummingbird, MD;  Location: [facility];  Service: General;  Laterality: N/A;   Past Surgical History:  Procedure Laterality Date   ANKLE FRACTURE SURGERY     COLONOSCOPY  2019   polyps, Dr Tova Fresh   INSERTION OF MESH N/A 09/26/2016   Procedure: INSERTION OF MESH;  Surgeon: Aldean Hummingbird, MD;  Location: [facility];  Service: General;  Laterality: N/A;   UMBILICAL HERNIA REPAIR N/A 09/26/2016   Procedure: LAPAROSCOPIC ASSISTED UMBILICAL HERNIA REPAIR WITH MESH;  Surgeon: Aldean Hummingbird, MD;  Location: [facility];  Service: General;  Laterality: N/A;   Past Medical History:  Diagnosis Date   Arthritis    Hypertension    Ht 5\' 5"  (1.651 m)   BMI 36.91 kg/m   Opioid Risk Score:   Fall Risk Score:  `1  Depression screen PHQ 2/9     10/27/2023    1:08 PM 08/18/2023   12:53 PM  06/16/2023    3:01 PM 06/16/2023    1:06 PM 03/17/2023    1:06 PM 11/18/2022    4:01 PM 05/13/2022    3:00 PM  Depression screen PHQ 2/9  Decreased Interest 0 0 1 0 0 0 0  Down, Depressed, Hopeless 0 0 0 0 0 0 0  PHQ - 2 Score 0 0 1 0 0 0 0     Review of Systems  Musculoskeletal:         Left foot pain  All other systems reviewed and are negative.      Objective:  Gen: no distress, normal appearing HEENT: oral mucosa pink and moist, NCAT Chest: normal effort, normal rate of breathing Abd:  non-distended Psych: pleasant, appropriate Skin: intact Neuro: Awake and alert CN 2-12 grossly intact, no ataxia noted, follows commands Musculoskeletal no focal motor deficits. Minimal ROM at L ankle Sensation intact all 4 extremities but altered is distal feet- stocking glove type distribution  L foot with well healed surgical incisions on anterior and lateral ankle Positive for left ankle swelling- unchanged L > R ankle tenderness              Assessment & Plan:    Chronic L ankle pain after ankle fracture and multiple surgeries -s/p ORIF and later ankle arthrodesis, seen by podiatry without any further procedural options -Reports poor results with multiple other medications and treatments             -He was unable to tolerate duloxetine               -Continue oxycodone  10 mg TID, reports keeping pain controlled, reordered today for next 2 refills             -  Advised trying TENS unit             -Continue monitoring random UDS, pill counts, PDMP -Pill count is consistent today 11/15  Right ankle pain -Pain mostly with ambulation.   -X-rays in 2022 with medial OA changes noted.  I do think altered gait due to his left ankle may be contributing to OA of his right ankle.   Alcohol use history -Continue to reinforce abstinence from alcohol and illicit drugs -Continue UDS monitoring   Likely polyneuropathy due to diabetes vs prediabetes. Last A1C 6.3 but had 6.8 A1C in  2022 -Start gabapentin  100mg  TID

## 2024-01-02 LAB — TOXASSURE SELECT,+ANTIDEPR,UR

## 2024-01-04 ENCOUNTER — Encounter

## 2024-01-04 ENCOUNTER — Ambulatory Visit: Admitting: Family

## 2024-01-04 ENCOUNTER — Encounter: Payer: Self-pay | Admitting: Family

## 2024-01-04 ENCOUNTER — Ambulatory Visit: Admitting: *Deleted

## 2024-01-04 VITALS — Ht 65.0 in | Wt 219.6 lb

## 2024-01-04 VITALS — BP 112/64 | HR 63 | Ht 65.0 in | Wt 221.2 lb

## 2024-01-04 DIAGNOSIS — E782 Mixed hyperlipidemia: Secondary | ICD-10-CM

## 2024-01-04 DIAGNOSIS — E119 Type 2 diabetes mellitus without complications: Secondary | ICD-10-CM

## 2024-01-04 DIAGNOSIS — Z0000 Encounter for general adult medical examination without abnormal findings: Secondary | ICD-10-CM

## 2024-01-04 DIAGNOSIS — I10 Essential (primary) hypertension: Secondary | ICD-10-CM

## 2024-01-04 DIAGNOSIS — E0849 Diabetes mellitus due to underlying condition with other diabetic neurological complication: Secondary | ICD-10-CM | POA: Insufficient documentation

## 2024-01-04 NOTE — Patient Instructions (Signed)
 Mr. Brandon Reyes , Thank you for taking time out of your busy schedule to complete your Annual Wellness Visit with me. I enjoyed our conversation and look forward to speaking with you again next year. I, as well as your care team,  appreciate your ongoing commitment to your health goals. Please review the following plan we discussed and let me know if I can assist you in the future. Your Game plan/ To Do List     Follow up Visits: Next Medicare AWV with our clinical staff: 01/07/25 3pm   Next Office Visit with your provider: 01/04/24 2pm  Clinician Recommendations:  Aim for 30 minutes of exercise or brisk walking, 6-8 glasses of water, and 5 servings of fruits and vegetables each day.       This is a list of the screening recommended for you and due dates:  Health Maintenance  Topic Date Due   Complete foot exam   Never done   Eye exam for diabetics  09/15/2022   Medicare Annual Wellness Visit  11/18/2023   Hemoglobin A1C  12/14/2023   Flu Shot  03/01/2024   Yearly kidney function blood test for diabetes  06/15/2024   Yearly kidney health urinalysis for diabetes  11/07/2024   Colon Cancer Screening  03/23/2030   DTaP/Tdap/Td vaccine (3 - Td or Tdap) 04/17/2031   Pneumococcal Vaccination  Completed   COVID-19 Vaccine  Completed   Hepatitis C Screening  Completed   HIV Screening  Completed   Zoster (Shingles) Vaccine  Completed   HPV Vaccine  Aged Out   Meningitis B Vaccine  Aged Out    Advanced directives: (Provided) Advance directive discussed with you today. I have provided a copy for you to complete at home and have notarized. Once this is complete, please bring a copy in to our office so we can scan it into your chart.  Advance Care Planning is important because it:  [x]  Makes sure you receive the medical care that is consistent with your values, goals, and preferences  [x]  It provides guidance to your family and loved ones and reduces their decisional burden about whether or not they  are making the right decisions based on your wishes.  Follow the link provided in your after visit summary or read over the paperwork we have mailed to you to help you started getting your Advance Directives in place. If you need assistance in completing these, please reach out to us  so that we can help you!  See attachments for Preventive Care and Fall Prevention Tips.

## 2024-01-04 NOTE — Progress Notes (Signed)
 Subjective:   Brandon Reyes is a 63 y.o. who presents for a Medicare Wellness preventive visit.  As a reminder, Annual Wellness Visits don't include a physical exam, and some assessments may be limited, especially if this visit is performed virtually. We may recommend an in-person follow-up visit with your provider if needed.  Visit Complete: In person   Persons Participating in Visit: Patient.  AWV Questionnaire: No: Patient Medicare AWV questionnaire was not completed prior to this visit.  Cardiac Risk Factors include: hypertension;diabetes mellitus;male gender     Objective:     Today's Vitals   01/04/24 1013  Weight: 219 lb 9.6 oz (99.6 kg)  Height: 5\' 5"  (1.651 m)   Body mass index is 36.54 kg/m.     01/04/2024   10:46 AM 11/18/2022    3:59 PM 02/04/2022   10:26 AM 12/14/2021    2:11 PM 11/15/2021    8:15 AM 04/16/2021    2:33 PM 06/18/2018   10:05 AM  Advanced Directives  Does Patient Have a Medical Advance Directive? No No No No Yes No Yes  Type of Advance Directive     Living will    Does patient want to make changes to medical advance directive?       No - Patient declined  Would patient like information on creating a medical advance directive? Yes (MAU/Ambulatory/Procedural Areas - Information given) No - Patient declined  Yes (MAU/Ambulatory/Procedural Areas - Information given)       Current Medications (verified) Outpatient Encounter Medications as of 01/04/2024  Medication Sig   amLODipine  (NORVASC ) 5 MG tablet Take 1 tablet (5 mg total) by mouth daily.   atorvastatin  (LIPITOR) 40 MG tablet Take 1 tablet (40 mg total) by mouth daily.   Cholecalciferol (VITAMIN D3) 50 MCG (2000 UT) capsule Take 2,000 Units by mouth daily.   Cyanocobalamin  2500 MCG TABS Take 2,500 mcg by mouth every other day. Vitamin B12   gabapentin  (NEURONTIN ) 100 MG capsule Take 1 capsule (100 mg total) by mouth 3 (three) times daily.   metoprolol  succinate (TOPROL -XL) 25 MG 24 hr tablet  Take 1 tablet (25 mg total) by mouth daily.   Omega-3 Fatty Acids (FISH OIL PO) Take by mouth.   Oxycodone  HCl 10 MG TABS Take 1 tablet (10 mg total) by mouth 3 (three) times daily as needed. Do not fill until 01/31/2024   Oxycodone  HCl 10 MG TABS Take 1 tablet (10 mg total) by mouth 3 (three) times daily as needed. Do not fill until 01/02/2024   sildenafil  (VIAGRA ) 100 MG tablet TAKE 1 TABLET DAILY AS     NEEDED FOR ERECTILE        DYSFUNCTION   [DISCONTINUED] tirzepatide (MOUNJARO) 2.5 MG/0.5ML Pen Inject 2.5 mg into the skin once a week. (Patient not taking: Reported on 01/04/2024)   Facility-Administered Encounter Medications as of 01/04/2024  Medication   diclofenac  Sodium (VOLTAREN ) 1 % topical gel 4 g    Allergies (verified) Acetaminophen  and Duloxetine  hcl   History: Past Medical History:  Diagnosis Date   Arthritis    Hypertension    Past Surgical History:  Procedure Laterality Date   ANKLE FRACTURE SURGERY     COLONOSCOPY  2019   polyps, Dr Tova Fresh   INSERTION OF MESH N/A 09/26/2016   Procedure: INSERTION OF MESH;  Surgeon: Aldean Hummingbird, MD;  Location: [facility];  Service: General;  Laterality: N/A;   UMBILICAL HERNIA REPAIR N/A 09/26/2016   Procedure: LAPAROSCOPIC ASSISTED UMBILICAL HERNIA REPAIR  WITH MESH;  Surgeon: Aldean Hummingbird, MD;  Location: [facility];  Service: General;  Laterality: N/A;   Family History  Problem Relation Age of Onset   Hypertension Mother    Diabetes Mother    Hypertension Sister    Hypertension Sister    Hypertension Maternal Grandmother    Colon cancer Other    Colon polyps Neg Hx    Esophageal cancer Neg Hx    Rectal cancer Neg Hx    Stomach cancer Neg Hx    Social History   Socioeconomic History   Marital status: Married    Spouse name: Not on file   Number of children: 0   Years of education: 12   Highest education level: Not on file  Occupational History   Occupation: Unemployed  Tobacco Use   Smoking status: Never   Smokeless tobacco:  Never  Vaping Use   Vaping status: Never Used  Substance and Sexual Activity   Alcohol use: Not Currently   Drug use: Not Currently   Sexual activity: Yes  Other Topics Concern   Not on file  Social History Narrative    Fun/Hobby - Travel    Lives with Wife   Social Drivers of Health   Financial Resource Strain: Medium Risk (01/04/2024)   Overall Financial Resource Strain (CARDIA)    Difficulty of Paying Living Expenses: Somewhat hard  Food Insecurity: No Food Insecurity (01/04/2024)   Hunger Vital Sign    Worried About Running Out of Food in the Last Year: Never true    Ran Out of Food in the Last Year: Never true  Transportation Needs: No Transportation Needs (01/04/2024)   PRAPARE - Administrator, Civil Service (Medical): No    Lack of Transportation (Non-Medical): No  Physical Activity: Sufficiently Active (01/04/2024)   Exercise Vital Sign    Days of Exercise per Week: 5 days    Minutes of Exercise per Session: 150+ min  Stress: No Stress Concern Present (01/04/2024)   [employer] of Occupational Health - Occupational Stress Questionnaire    Feeling of Stress : Not at all  Social Connections: Moderately Integrated (01/04/2024)   Social Connection and Isolation Panel [NHANES]    Frequency of Communication with Friends and Family: More than three times a week    Frequency of Social Gatherings with Friends and Family: Not on file    Attends Religious Services: More than 4 times per year    Active Member of [employer] or Organizations: No    Attends Banker Meetings: Never    Marital Status: Married    Tobacco Counseling Counseling given: Not Answered    Clinical Intake:  Pre-visit preparation completed: Yes  Pain : No/denies pain     BMI - recorded: 36.54 Nutritional Risks: None Diabetes: Yes CBG done?: No Did pt. bring in CBG monitor from home?: No  Lab Results  Component Value Date   HGBA1C 6.3 (H) 06/16/2023   HGBA1C 6.3 (H)  01/13/2023   HGBA1C 6.1 12/14/2021     How often do you need to have someone help you when you read instructions, pamphlets, or other written materials from your doctor or pharmacy?: 1 - Never What is the last grade level you completed in school?: 12th grade  Interpreter Needed?: No  Information entered by :: Susa Engman, CMA   Activities of Daily Living     01/04/2024   10:34 AM  In your present state of health, do you have any difficulty  performing the following activities:  Hearing? 0  Vision? 0  Comment eye exam February with [facility]  Difficulty concentrating or making decisions? 0  Walking or climbing stairs? 0  Dressing or bathing? 0  Doing errands, shopping? 0  Preparing Food and eating ? N  Comment eats out a lot  Using the Toilet? N  In the past six months, have you accidently leaked urine? N  Do you have problems with loss of bowel control? N  Managing your Medications? N  Managing your Finances? N  Housekeeping or managing your Housekeeping? N    Patient Care Team: Adra Alanis, FNP as PCP - General (Internal Medicine) Ike Malady Honora Lutes, FNP (Internal Medicine) Buck Carbon, [place], [place] as Referring Physician (Optometry)  I have updated your Care Teams any recent Medical Services you may have received from other providers in the past year.     Assessment:    This is a routine wellness examination for Brandon Reyes.  Hearing/Vision screen Hearing Screening - Comments:: Denies difficulty Vision Screening - Comments:: Eye exam up to date.   Goals Addressed             This Visit's Progress    Patient Stated   On track    To stay healthy       Depression Screen     01/04/2024   10:43 AM 10/27/2023    1:08 PM 08/18/2023   12:53 PM 06/16/2023    3:01 PM 06/16/2023    1:06 PM 03/17/2023    1:06 PM 11/18/2022    4:01 PM  PHQ 2/9 Scores  PHQ - 2 Score 0 0 0 1 0 0 0    Fall Risk     01/04/2024   10:31 AM 10/27/2023    1:08 PM 08/18/2023    12:53 PM 06/16/2023    3:01 PM 06/16/2023    1:06 PM  Fall Risk   Falls in the past year? 0 0 0 0 0  Number falls in past yr: 0  0 0 0  Injury with Fall? 0  0 0 0  Risk for fall due to : No Fall Risks   No Fall Risks   Follow up Education provided   Falls evaluation completed     MEDICARE RISK AT HOME:  Medicare Risk at Home Any stairs in or around the home?: Yes If so, are there any without handrails?: No Home free of loose throw rugs in walkways, pet beds, electrical cords, etc?: Yes Adequate lighting in your home to reduce risk of falls?: Yes Life alert?: No Use of a cane, walker or w/c?: Yes (has cane for occasional use) Grab bars in the bathroom?: No Shower chair or bench in shower?: No Elevated toilet seat or a handicapped toilet?: Yes  TIMED UP AND GO:  Was the test performed?  Yes  Length of time to ambulate 10 feet: 5 sec Gait steady and fast without use of assistive device  Cognitive Function: Declined/Normal: No cognitive concerns noted by patient or family. Patient alert, oriented, able to answer questions appropriately and recall recent events. No signs of memory loss or confusion.        11/18/2022    4:00 PM 11/15/2021    8:17 AM  6CIT Screen  What Year? 0 points 0 points  What month? 0 points 0 points  What time? 0 points 0 points  Count back from 20 0 points 0 points  Months in reverse 0 points 0 points  Repeat phrase 0 points 4 points  Total Score 0 points 4 points    Immunizations Immunization History  Administered Date(s) Administered   Influenza-Unspecified 06/01/2018   PFIZER(Purple Top)SARS-COV-2 Vaccination 09/30/2019, 10/23/2019   PNEUMOCOCCAL CONJUGATE-20 12/04/2020   Pfizer Covid-19 Vaccine Bivalent Booster 15yrs & up 09/02/2021   Pfizer(Comirnaty)Fall Seasonal Vaccine 12 years and older 06/16/2023   Tdap 07/01/2017, 04/16/2021   Zoster Recombinant(Shingrix ) 12/14/2021, 01/13/2023    Screening Tests Health Maintenance  Topic Date  Due   FOOT EXAM  Never done   OPHTHALMOLOGY EXAM  09/15/2022   Medicare Annual Wellness (AWV)  11/18/2023   HEMOGLOBIN A1C  12/14/2023   INFLUENZA VACCINE  03/01/2024   Diabetic kidney evaluation - eGFR measurement  06/15/2024   Diabetic kidney evaluation - Urine ACR  11/07/2024   Colonoscopy  03/23/2030   DTaP/Tdap/Td (3 - Td or Tdap) 04/17/2031   Pneumococcal Vaccine 60-64 Years old  Completed   COVID-19 Vaccine  Completed   Hepatitis C Screening  Completed   HIV Screening  Completed   Zoster Vaccines- Shingrix   Completed   HPV VACCINES  Aged Out   Meningococcal B Vaccine  Aged Out    Health Maintenance  Health Maintenance Due  Topic Date Due   FOOT EXAM  Never done   OPHTHALMOLOGY EXAM  09/15/2022   Medicare Annual Wellness (AWV)  11/18/2023   HEMOGLOBIN A1C  12/14/2023   Health Maintenance Items Addressed: Request recent eye exam from Dr Buck Carbon. Pt will get A1c, foot exam at PCP today.  Additional Screening:  Vision Screening: Recommended annual ophthalmology exams for early detection of glaucoma and other disorders of the eye. Would you like a referral to an eye doctor? No    Dental Screening: Recommended annual dental exams for proper oral hygiene  Community Resource Referral / Chronic Care Management: CRR required this visit?  No   CCM required this visit?  No   Plan:    I have personally reviewed and noted the following in the patient's chart:   Medical and social history Use of alcohol, tobacco or illicit drugs  Current medications and supplements including opioid prescriptions. Patient is currently taking opioid prescriptions. Information provided to patient regarding non-opioid alternatives. Patient advised to discuss non-opioid treatment plan with their provider. Functional ability and status Nutritional status Physical activity Advanced directives List of other physicians Hospitalizations, surgeries, and ER visits in previous 12  months Vitals Screenings to include cognitive, depression, and falls Referrals and appointments  In addition, I have reviewed and discussed with patient certain preventive protocols, quality metrics, and best practice recommendations. A written personalized care plan for preventive services as well as general preventive health recommendations were provided to patient.   Susa Engman, CMA   01/04/2024   After Visit Summary: (In Person-Printed) AVS printed and given to the patient  Notes: Please refer to Routing Comments.

## 2024-01-04 NOTE — Progress Notes (Signed)
 Brandon Reyes is a 63 y.o. 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 diabetic neuro comp (HCC) 01/04/2024   Change in bowel habit 09/14/2021   Constipation 09/14/2021   Morbid obesity (HCC) 09/14/2021   History of colonic polyps 09/14/2021   Arthritis of left subtalar joint 10/25/2018   Newly diagnosed diabetes (HCC) 06/18/2018   Prediabetes 04/03/2018   Vitamin D  deficiency 03/16/2018   Encounter for general adult medical examination with abnormal findings 03/16/2018   Sinus tachycardia 08/23/2017   Anemia 08/23/2017   Pain from implanted hardware 05/18/2017   Essential hypertension 12/29/2016   Arthritis of left ankle 11/17/2016    Current Outpatient Medications  Medication Sig Dispense Refill   amLODipine  (NORVASC ) 5 MG tablet Take 1 tablet (5 mg total) by mouth daily. 90 tablet 3   atorvastatin  (LIPITOR) 40 MG tablet Take 1 tablet (40 mg total) by mouth daily. 90 tablet 3   Cholecalciferol (VITAMIN D3) 50 MCG (2000 UT) capsule Take 2,000 Units by mouth daily.     Cyanocobalamin  2500 MCG TABS Take 2,500 mcg by mouth every other day. Vitamin B12     gabapentin  (NEURONTIN ) 100 MG capsule Take 1 capsule (100 mg total) by mouth 3 (three) times daily. 90 capsule 3   metoprolol  succinate (TOPROL -XL) 25 MG 24 hr tablet Take 1 tablet (25 mg total) by mouth daily. 90 tablet 3   Omega-3 Fatty Acids (FISH OIL PO) Take by mouth.     Oxycodone  HCl 10 MG TABS Take 1 tablet (10 mg total) by mouth 3 (three) times daily as needed. Do not fill until 01/31/2024 90 tablet 0   Oxycodone  HCl 10 MG TABS Take 1 tablet (10 mg total) by mouth 3 (three) times daily as needed. Do not fill until 01/02/2024 90 tablet 0   sildenafil  (VIAGRA ) 100 MG tablet TAKE 1 TABLET DAILY AS     NEEDED FOR ERECTILE        DYSFUNCTION 18 tablet 3   Current Facility-Administered Medications  Medication Dose Route  Frequency Provider Last Rate Last Admin   diclofenac  Sodium (VOLTAREN ) 1 % topical gel 4 g  4 g Topical QID Lylia Sand, MD        Allergies: Acetaminophen  and Duloxetine  hcl  Past Medical History:  Diagnosis Date   Arthritis    Hypertension     Past Surgical History:  Procedure Laterality Date   ANKLE FRACTURE SURGERY     COLONOSCOPY  2019   polyps, Dr Tova Fresh   INSERTION OF MESH N/A 09/26/2016   Procedure: INSERTION OF MESH;  Surgeon: Aldean Hummingbird, MD;  Location: [facility];  Service: General;  Laterality: N/A;   UMBILICAL HERNIA REPAIR N/A 09/26/2016   Procedure: LAPAROSCOPIC ASSISTED UMBILICAL HERNIA REPAIR WITH MESH;  Surgeon: Aldean Hummingbird, MD;  Location: [facility];  Service: General;  Laterality: N/A;    Family History  Problem Relation Age of Onset   Hypertension Mother    Diabetes Mother    Hypertension Sister    Hypertension Sister    Hypertension Maternal Grandmother    Colon cancer Other    Colon polyps Neg Hx    Esophageal cancer Neg Hx    Rectal cancer Neg Hx    Stomach cancer Neg Hx     Social History   Tobacco Use   Smoking status: Never   Smokeless tobacco: Never  Substance Use  Topics   Alcohol use: Not Currently    Subjective:   6 month follow up on chronic care needs; is diet controlled Type 2 Diabetes; Mounjaro was prescribed in November but patient not currently taking due to insurance coverage;  No acute concerns today; Denies any chest pain, shortness of breath, blurred vision or headache   Objective:  Vitals:   01/04/24 1348  BP: 112/64  Pulse: 63  SpO2: 98%  Weight: 221 lb 3.2 oz (100.3 kg)  Height: 5\' 5"  (1.651 m)    General: Well developed, well nourished, in no acute distress  Skin : Warm and dry.  Head: Normocephalic and atraumatic  Eyes: Sclera and conjunctiva clear; pupils round and reactive to light; extraocular movements intact  Ears: External normal; canals clear; tympanic membranes normal  Oropharynx: Pink, supple. No suspicious  lesions  Neck: Supple without thyromegaly, adenopathy  Lungs: Respirations unlabored; clear to auscultation bilaterally without wheeze, rales, rhonchi  CVS exam: normal rate and regular rhythm.  Abdomen: Soft; nontender; nondistended; normoactive bowel sounds; no masses or hepatosplenomegaly  Musculoskeletal: No deformities; no active joint inflammation  Extremities: No edema, cyanosis, clubbing  Vessels: Symmetric bilaterally  Neurologic: Alert and oriented; speech intact; face symmetrical; moves all extremities well; CNII-XII intact without focal deficit  Assessment:  1. Diet-controlled diabetes mellitus (HCC)   2. Diabetes due to undrl condition w oth diabetic neuro comp (HCC) Chronic  3. Mixed hyperlipidemia   4. Essential hypertension   5. Morbid obesity (HCC)     Plan:  Check CBC, CMP, Hgba1c; patient not currently taking any medication at this time; Stable; continue same medication; Stable; continue same medication;    Handicapped placard completed for patient- he will take to [facility];   No follow-ups on file.  Orders Placed This Encounter  Procedures   CBC with Differential/Platelet   Comp Met (CMET)   Hemoglobin A1c    Requested Prescriptions    No prescriptions requested or ordered in this encounter

## 2024-01-05 ENCOUNTER — Ambulatory Visit: Payer: Self-pay | Admitting: Family

## 2024-01-05 LAB — COMPREHENSIVE METABOLIC PANEL WITH GFR
ALT: 20 U/L (ref 0–53)
AST: 17 U/L (ref 0–37)
Albumin: 3.3 g/dL — ABNORMAL LOW (ref 3.5–5.2)
Alkaline Phosphatase: 37 U/L — ABNORMAL LOW (ref 39–117)
BUN: 16 mg/dL (ref 6–23)
CO2: 32 meq/L (ref 19–32)
Calcium: 8.5 mg/dL (ref 8.4–10.5)
Chloride: 104 meq/L (ref 96–112)
Creatinine, Ser: 0.84 mg/dL (ref 0.40–1.50)
GFR: 93.8 mL/min (ref >60.00)
Glucose, Bld: 95 mg/dL (ref 70–99)
Potassium: 4.1 meq/L (ref 3.5–5.1)
Sodium: 141 meq/L (ref 135–145)
Total Bilirubin: 0.4 mg/dL (ref 0.2–1.2)
Total Protein: 5 g/dL — ABNORMAL LOW (ref 6.0–8.3)

## 2024-01-05 LAB — CBC WITH DIFFERENTIAL/PLATELET
Basophils Absolute: 0.1 10*3/uL (ref 0.0–0.1)
Basophils Relative: 1.1 % (ref 0.0–3.0)
Eosinophils Absolute: 0.4 10*3/uL (ref 0.0–0.7)
Eosinophils Relative: 4.4 % (ref 0.0–5.0)
HCT: 42.1 % (ref 39.0–52.0)
Hemoglobin: 13.5 g/dL (ref 13.0–17.0)
Lymphocytes Relative: 26.9 % (ref 12.0–46.0)
Lymphs Abs: 2.6 10*3/uL (ref 0.7–4.0)
MCHC: 32.1 g/dL (ref 30.0–36.0)
MCV: 89.2 fl (ref 78.0–100.0)
Monocytes Absolute: 0.7 10*3/uL (ref 0.1–1.0)
Monocytes Relative: 7.8 % (ref 3.0–12.0)
Neutro Abs: 5.8 10*3/uL (ref 1.4–7.7)
Neutrophils Relative %: 59.8 % (ref 43.0–77.0)
Platelets: 191 10*3/uL (ref 150.0–400.0)
RBC: 4.72 Mil/uL (ref 4.22–5.81)
RDW: 13.9 % (ref 11.5–15.5)
WBC: 9.6 10*3/uL (ref 4.0–10.5)

## 2024-01-05 LAB — HEMOGLOBIN A1C: Hgb A1c MFr Bld: 6.3 % (ref 4.6–6.5)

## 2024-01-08 ENCOUNTER — Ambulatory Visit: Admitting: Family Medicine

## 2024-02-23 ENCOUNTER — Encounter: Payer: Self-pay | Admitting: Physical Medicine & Rehabilitation

## 2024-02-23 ENCOUNTER — Encounter: Attending: Physical Medicine & Rehabilitation | Admitting: Physical Medicine & Rehabilitation

## 2024-02-23 VITALS — BP 117/70 | HR 73 | Ht 65.0 in | Wt 225.0 lb

## 2024-02-23 DIAGNOSIS — M19071 Primary osteoarthritis, right ankle and foot: Secondary | ICD-10-CM | POA: Diagnosis not present

## 2024-02-23 DIAGNOSIS — M25572 Pain in left ankle and joints of left foot: Secondary | ICD-10-CM | POA: Diagnosis present

## 2024-02-23 DIAGNOSIS — G8929 Other chronic pain: Secondary | ICD-10-CM | POA: Insufficient documentation

## 2024-02-23 DIAGNOSIS — M25571 Pain in right ankle and joints of right foot: Secondary | ICD-10-CM | POA: Insufficient documentation

## 2024-02-23 DIAGNOSIS — Z79891 Long term (current) use of opiate analgesic: Secondary | ICD-10-CM | POA: Diagnosis present

## 2024-02-23 DIAGNOSIS — Z981 Arthrodesis status: Secondary | ICD-10-CM | POA: Diagnosis not present

## 2024-02-23 DIAGNOSIS — G894 Chronic pain syndrome: Secondary | ICD-10-CM | POA: Diagnosis present

## 2024-02-23 DIAGNOSIS — G629 Polyneuropathy, unspecified: Secondary | ICD-10-CM | POA: Insufficient documentation

## 2024-02-23 MED ORDER — OXYCODONE HCL 10 MG PO TABS: 10.0000 mg | ORAL_TABLET | Freq: Three times a day (TID) | ORAL | 0 refills | Status: DC | PRN

## 2024-02-23 NOTE — Progress Notes (Signed)
 Subjective:    Patient ID: Brandon Reyes, male    DOB: 1960/08/30, 63 y.o.   MRN: [id number]  HPI:    HPI 01/04/22 63 year old male here with PMH of type 2 DM, HTN here for chronic left ankle pain.  He broke his ankle in 2015 when he stepped off a curb and had ORIF.   He reports he had 2 other surgeries after this including ankle arthrodesis by Dr. Glendia at [facility]. He continues to have severe pain since this time. Pain is constant but worsened by walking. He walks with a cane.  Pain is sharp and achy.  Ankle brace did not help his pain.  He had ankle injections without benefit. Patient reports he takes tylenol  with minimal benefit. Reports percocet helped his pain in the past. When asked if he took lyrica, gabapentin  his he wasn't certain but his partner thought he used these without benefit. She said he tried tramadol  and it didn't help. Lidocaine  cream and patch didn't help the pain. Has not tried TENS unit but has access to TENS.   Interval History 08/04/2022 [name] is here to follow-up regarding his chronic pain in his left ankle.  He continues to use oxycodone  10 mg 3 times daily as needed.  He reports this medicine is helping keep his pain under control.  Denies side effects with this medication.  He reports he is able to walk around his house when using the medication.  Allows him to be more active than go to stores and do other such activities with his wife.   Interval history 10/08/2022 [name] is here for follow-up regarding his chronic ankle pain.  He reports oxycodone  continues to help keep his pain tolerable.  No side effects with this medication reported.  He does report he feels like he is having some discomfort in his right ankle as well, not as severe as his left side.  He denies any significant injury to this joint.  Patient is able to walk more and do more chores around the home, go to the store with the oxycodone  medication.  Patient would also like to pick upKnee-high compression  stockings for dependent edema in his legs.   Interval history 12/30/22 [name] is here for follow-up for his left ankle pain.  He reports he is doing overall well and continues to use oxycodone .  He says this medicine helps keep his pain under control and he is able to do more than he used to do without medication.  He is still as active as he used to be when he was younger but he feels like he is able to function.  He is excited about his birthday coming up on [birth year].  He does have some occasional pain in his right ankle as well.  He says if this gets worse he will plan to see his podiatrist.   Interval history 03/17/23 [name] is here for follow-up regarding his ankle pain.  He continues to have pain with ambulation in his left ankle.  Pain has not changed since last visit.  Oxycodone  continues to help keep his pain under control.  He tries to use it when his pain is severe.  He is not having any side effects with oxycodone .  He has some pain and swelling in his left ankle today from increased ambulation.   Interval history 06/16/23 [name] is here for follow-up regarding his left ankle pain.  Overall his pain is unchanged since  prior visit.  He continues to use oxycodone  10 mg which is helping keep his pain under control, not causing any significant side effects.  He has been using a cane for ambulation but like to try to see if he can walk without this in the home and use it only for longer distances.  He also has pain in his right ankle primarily when ambulating for longer distances.  Interval history 12/29/23 Pt last seen by [name].  Ankle pain in his ankles  L>R controlled with current dose oxycodone  and no side effects with medication. Medication helps him to stay active.  Over past few months he has been having burning pain tingling and altered sensation in his b/l feet left > right.  Hx of prediabetes vs diabetes. Pain is worse at night.   Interval history 02/23/24 Patient reports  he is doing well overall.  Continues to have pain in his ankles left greater than right.  Continues to be managed with oxycodone .  He reports that the burning pain in his feet left greater than right has also improved with low-dose gabapentin .  Discomfort is worse at night.  He is not having any side effects with either medications.  Pain Inventory Average Pain 7 Pain Right Now 7 My pain is intermittent, sharp, stabbing, and tingling  In the last 24 hours, has pain interfered with the following? General activity 7 Relation with others 7 Enjoyment of life 7 What TIME of day is your pain at its worst? morning , daytime, evening, night, and varies Sleep (in general) Fair  Pain is worse with: walking Pain improves with: medication Relief from Meds: 7  Family History  Problem Relation Age of Onset   Hypertension Mother    Diabetes Mother    Hypertension Sister    Hypertension Sister    Hypertension Maternal Grandmother    Colon cancer Other    Colon polyps Neg Hx    Esophageal cancer Neg Hx    Rectal cancer Neg Hx    Stomach cancer Neg Hx    Social History   Socioeconomic History   Marital status: Married    Spouse name: Not on file   Number of children: 0   Years of education: 12   Highest education level: Not on file  Occupational History   Occupation: Unemployed  Tobacco Use   Smoking status: Never   Smokeless tobacco: Never  Vaping Use   Vaping status: Never Used  Substance and Sexual Activity   Alcohol use: Not Currently   Drug use: Not Currently   Sexual activity: Yes  Other Topics Concern   Not on file  Social History Narrative    Fun/Hobby - Travel    Lives with Wife   Social Drivers of Health   Financial Resource Strain: Medium Risk (01/04/2024)   Overall Financial Resource Strain (CARDIA)    Difficulty of Paying Living Expenses: Somewhat hard  Food Insecurity: No Food Insecurity (01/04/2024)   Hunger Vital Sign    Worried About Running Out of Food in  the Last Year: Never true    Ran Out of Food in the Last Year: Never true  Transportation Needs: No Transportation Needs (01/04/2024)   PRAPARE - Administrator, Civil Service (Medical): No    Lack of Transportation (Non-Medical): No  Physical Activity: Sufficiently Active (01/04/2024)   Exercise Vital Sign    Days of Exercise per Week: 5 days    Minutes of Exercise per Session: 150+ min  Stress: No Stress Concern Present (01/04/2024)   [employer] of Occupational Health - Occupational Stress Questionnaire    Feeling of Stress : Not at all  Social Connections: Moderately Integrated (01/04/2024)   Social Connection and Isolation Panel    Frequency of Communication with Friends and Family: More than three times a week    Frequency of Social Gatherings with Friends and Family: Not on file    Attends Religious Services: More than 4 times per year    Active Member of [employer] or Organizations: No    Attends Banker Meetings: Never    Marital Status: Married   Past Surgical History:  Procedure Laterality Date   ANKLE FRACTURE SURGERY     COLONOSCOPY  2019   polyps, Dr Kristie   INSERTION OF MESH N/A 09/26/2016   Procedure: INSERTION OF MESH;  Surgeon: Camellia Blush, MD;  Location: [facility];  Service: General;  Laterality: N/A;   UMBILICAL HERNIA REPAIR N/A 09/26/2016   Procedure: LAPAROSCOPIC ASSISTED UMBILICAL HERNIA REPAIR WITH MESH;  Surgeon: Camellia Blush, MD;  Location: [facility];  Service: General;  Laterality: N/A;   Past Surgical History:  Procedure Laterality Date   ANKLE FRACTURE SURGERY     COLONOSCOPY  2019   polyps, Dr Kristie   INSERTION OF MESH N/A 09/26/2016   Procedure: INSERTION OF MESH;  Surgeon: Camellia Blush, MD;  Location: [facility];  Service: General;  Laterality: N/A;   UMBILICAL HERNIA REPAIR N/A 09/26/2016   Procedure: LAPAROSCOPIC ASSISTED UMBILICAL HERNIA REPAIR WITH MESH;  Surgeon: Camellia Blush, MD;  Location: [facility];  Service: General;  Laterality: N/A;    Past Medical History:  Diagnosis Date   Arthritis    Hypertension    BP 117/70   Pulse 73   Ht 5' 5 (1.651 m)   Wt 225 lb (102.1 kg)   SpO2 94%   BMI 37.44 kg/m   Opioid Risk Score:   Fall Risk Score:  `1  Depression screen [facility] 2/9     02/23/2024    2:55 PM 01/04/2024   10:43 AM 10/27/2023    1:08 PM 08/18/2023   12:53 PM 06/16/2023    3:01 PM 06/16/2023    1:06 PM 03/17/2023    1:06 PM  Depression screen PHQ 2/9  Decreased Interest 0 0 0 0 1 0 0  Down, Depressed, Hopeless 0 0 0 0 0 0 0  PHQ - 2 Score 0 0 0 0 1 0 0     Review of Systems  Musculoskeletal:         Left foot pain  All other systems reviewed and are negative.      Objective:  Gen: no distress, normal appearing HEENT: oral mucosa pink and moist, NCAT Chest: normal effort, normal rate of breathing Abd:  non-distended Psych: pleasant, appropriate Skin: intact Neuro: Awake and alert CN 2-12 grossly intact, no ataxia noted, follows commands Musculoskeletal no focal motor deficits. Minimal ROM at L ankle-due to fusion Sensation intact all 4 extremities but altered is distal feet- unchanged L foot with well healed surgical incisions on anterior and lateral ankle Positive for left ankle swelling- unchanged L > R ankle tenderness              Assessment & Plan:    Chronic L ankle pain after ankle fracture and multiple surgeries -s/p ORIF and later ankle arthrodesis, seen by podiatry without any further procedural options -Reports poor results with multiple other medications and treatments             -  He was unable to tolerate duloxetine               -Continue oxycodone  10 mg TID, reports keeping pain controlled, reordered today for next 2 refills             -Advised trying TENS unit             -Continue monitoring random UDS, pill counts, PDMP -Pill count is consistent today   Right ankle pain -Pain mostly with ambulation.   -X-rays in 2022 with medial OA changes noted.  I do think  altered gait due to his left ankle may be contributing to OA of his right ankle.   Alcohol use history -Continue to reinforce abstinence from alcohol and illicit drugs -Continue UDS monitoring   Likely polyneuropathy due to diabetes vs prediabetes. Last A1C 6.3 but had 6.8 A1C in 2022 - Continue gabapentin  100mg  TID-reports this is helping and keeping pain at tolerable level

## 2024-04-20 ENCOUNTER — Other Ambulatory Visit: Payer: Self-pay

## 2024-04-20 ENCOUNTER — Encounter (HOSPITAL_BASED_OUTPATIENT_CLINIC_OR_DEPARTMENT_OTHER): Payer: Self-pay

## 2024-04-20 ENCOUNTER — Emergency Department (HOSPITAL_BASED_OUTPATIENT_CLINIC_OR_DEPARTMENT_OTHER)
Admission: EM | Admit: 2024-04-20 | Discharge: 2024-04-20 | Disposition: A | Discharge status: Home / Self Care | Attending: Emergency Medicine | Admitting: Emergency Medicine

## 2024-04-20 ENCOUNTER — Emergency Department (HOSPITAL_BASED_OUTPATIENT_CLINIC_OR_DEPARTMENT_OTHER)

## 2024-04-20 DIAGNOSIS — M7061 Trochanteric bursitis, right hip: Secondary | ICD-10-CM | POA: Diagnosis not present

## 2024-04-20 DIAGNOSIS — M79651 Pain in right thigh: Secondary | ICD-10-CM | POA: Diagnosis present

## 2024-04-20 DIAGNOSIS — D72829 Elevated white blood cell count, unspecified: Secondary | ICD-10-CM | POA: Insufficient documentation

## 2024-04-20 DIAGNOSIS — M7071 Other bursitis of hip, right hip: Secondary | ICD-10-CM | POA: Diagnosis not present

## 2024-04-20 DIAGNOSIS — Y939 Activity, unspecified: Secondary | ICD-10-CM | POA: Insufficient documentation

## 2024-04-20 LAB — BASIC METABOLIC PANEL WITH GFR
Anion gap: 7 (ref 5–15)
BUN: 14 mg/dL (ref 8–23)
CO2: 26 mmol/L (ref 22–32)
Calcium: 8 mg/dL — ABNORMAL LOW (ref 8.9–10.3)
Chloride: 105 mmol/L (ref 98–111)
Creatinine, Ser: 0.89 mg/dL (ref 0.61–1.24)
GFR, Estimated: >60 mL/min (ref >60)
Glucose, Bld: 108 mg/dL — ABNORMAL HIGH (ref 70–99)
Potassium: 4.3 mmol/L (ref 3.5–5.1)
Sodium: 139 mmol/L (ref 135–145)

## 2024-04-20 LAB — CBC WITH DIFFERENTIAL/PLATELET
Abs Immature Granulocytes: 0.12 K/uL — ABNORMAL HIGH (ref 0.00–0.07)
Basophils Absolute: 0.1 K/uL (ref 0.0–0.1)
Basophils Relative: 1 %
Eosinophils Absolute: 0.5 K/uL (ref 0.0–0.5)
Eosinophils Relative: 4 %
HCT: 42.5 % (ref 39.0–52.0)
Hemoglobin: 13.5 g/dL (ref 13.0–17.0)
Immature Granulocytes: 1 %
Lymphocytes Relative: 21 %
Lymphs Abs: 2.5 K/uL (ref 0.7–4.0)
MCH: 28.7 pg (ref 26.0–34.0)
MCHC: 31.8 g/dL (ref 30.0–36.0)
MCV: 90.2 fL (ref 80.0–100.0)
Monocytes Absolute: 1 K/uL (ref 0.1–1.0)
Monocytes Relative: 8 %
Neutro Abs: 7.7 K/uL (ref 1.7–7.7)
Neutrophils Relative %: 65 %
Platelets: 198 K/uL (ref 150–400)
RBC: 4.71 MIL/uL (ref 4.22–5.81)
RDW: 13.1 % (ref 11.5–15.5)
WBC: 12 K/uL — ABNORMAL HIGH (ref 4.0–10.5)
nRBC: 0 % (ref 0.0–0.2)

## 2024-04-20 LAB — D-DIMER, QUANTITATIVE: D-Dimer, Quant: <0.27 ug{FEU}/mL (ref 0.00–0.50)

## 2024-04-20 LAB — CK: Total CK: 202 U/L (ref 49–397)

## 2024-04-20 MED ORDER — CEPHALEXIN 500 MG PO CAPS: 500.0000 mg | ORAL_CAPSULE | Freq: Four times a day (QID) | ORAL | 0 refills | Status: DC

## 2024-04-20 MED ORDER — OXYCODONE HCL 5 MG PO TABS
10.0000 mg | ORAL_TABLET | Freq: Once | ORAL | Status: AC
  Administered 2024-04-20: 10 mg via ORAL
  Filled 2024-04-20: qty 2

## 2024-04-20 MED ORDER — OXYCODONE HCL 5 MG PO TABS: 5.0000 mg | ORAL_TABLET | Freq: Four times a day (QID) | ORAL | 0 refills | Status: DC | PRN

## 2024-04-20 MED ORDER — CYCLOBENZAPRINE HCL 10 MG PO TABS
10.0000 mg | ORAL_TABLET | Freq: Two times a day (BID) | ORAL | 0 refills | Status: AC | PRN
Start: 2024-04-20 — End: ?

## 2024-04-20 MED ORDER — NAPROXEN 500 MG PO TABS: 500.0000 mg | ORAL_TABLET | Freq: Two times a day (BID) | ORAL | 0 refills | Status: DC

## 2024-04-20 NOTE — Discharge Instructions (Addendum)
 Your x-ray imaging and laboratory evaluation was overall reassuring.  Your laboratory evaluation points away from blood clot in your leg.  The location of your pain is consistent with likely trochanteric bursitis for which it is recommended you take NSAIDs for pain control as well as opiates for breakthrough pain. In the event that your pain is due to early cellulitis, an antibiotic has been prescribed. Dont operate heavy machinery while taking opiates and flexeril 

## 2024-04-20 NOTE — ED Notes (Signed)
 XR at bedside

## 2024-04-20 NOTE — ED Notes (Signed)
 ED Provider at bedside.

## 2024-04-20 NOTE — ED Notes (Signed)
 Pts wife out of room stating she has been here for 4 hours. It has currently been approx 2.  Pt is alert and oriented in bed, no apparent distress from what he stated is pain from a previous injury

## 2024-04-20 NOTE — ED Provider Notes (Signed)
  [place] [facility] Provider Note   CSN: [id number] Arrival date & time: 04/20/24  9658     Patient presents with: Leg Pain   Brandon Reyes is a 63 y.o. male.  {Add pertinent medical, surgical, social history, OB history to HPI:32947}  Leg Pain      Prior to Admission medications   Medication Sig Start Date End Date Taking? Authorizing Provider  amLODipine  (NORVASC ) 5 MG tablet Take 1 tablet (5 mg total) by mouth daily. 06/16/23   Jason Leita Repine, FNP  atorvastatin  (LIPITOR) 40 MG tablet Take 1 tablet (40 mg total) by mouth daily. 06/16/23   Jason Leita Repine, FNP  Cholecalciferol (VITAMIN D3) 50 MCG (2000 UT) capsule Take 2,000 Units by mouth daily. 08/25/17   [provider]  Cyanocobalamin  2500 MCG TABS Take 2,500 mcg by mouth every other day. Vitamin B12    [provider]  gabapentin  (NEURONTIN ) 100 MG capsule Take 1 capsule (100 mg total) by mouth 3 (three) times daily. 12/29/23   Urbano Albright, MD  metoprolol  succinate (TOPROL -XL) 25 MG 24 hr tablet Take 1 tablet (25 mg total) by mouth daily. 06/16/23   Jason Leita Repine, FNP  Omega-3 Fatty Acids (FISH OIL PO) Take by mouth.    [provider]  Oxycodone  HCl 10 MG TABS Take 1 tablet (10 mg total) by mouth 3 (three) times daily as needed. Do not fill until 01/31/2024 02/23/24   Urbano Albright, MD  Oxycodone  HCl 10 MG TABS Take 1 tablet (10 mg total) by mouth 3 (three) times daily as needed. Do not fill until 01/02/2024 02/23/24   Urbano Albright, MD  sildenafil  (VIAGRA ) 100 MG tablet TAKE 1 TABLET DAILY AS     NEEDED FOR ERECTILE        DYSFUNCTION 10/02/23   Jason Leita Repine, FNP    Allergies: Acetaminophen  and Duloxetine  hcl    Review of Systems  Updated Vital Signs BP 129/73   Pulse 72   Temp 97.8 F (36.6 C)   Resp 20   Ht 5' 5 (1.651 m)   Wt 99.8 kg   SpO2 98%   BMI 36.61 kg/m   Physical Exam  (all labs ordered are  listed, but only abnormal results are displayed) Labs Reviewed - No data to display  EKG: None  Radiology: No results found.  {Document cardiac monitor, telemetry assessment procedure when appropriate:32947} Procedures   Medications Ordered in the ED - No data to display    {Click here for ABCD2, HEART and other calculators REFRESH Note before signing:1}                              Medical Decision Making Amount and/or Complexity of Data Reviewed Labs: ordered. Radiology: ordered.  Risk Prescription drug management.   ***  {Document critical care time when appropriate  Document review of labs and clinical decision tools ie CHADS2VASC2, etc  Document your independent review of radiology images and any outside records  Document your discussion with family members, caretakers and with consultants  Document social determinants of health affecting pt's care  Document your decision making why or why not admission, treatments were needed:32947:::1}   Final diagnoses:  None    ED Discharge Orders     None

## 2024-04-20 NOTE — ED Triage Notes (Signed)
 Nontraumatic right hip/ upper leg pain that woke him from sleep.   Says he injured right ankle a few weeks ago and is unsure if that is related.   Ambulatory with cane.

## 2024-04-25 ENCOUNTER — Other Ambulatory Visit: Payer: Self-pay | Admitting: Physical Medicine & Rehabilitation

## 2024-04-26 ENCOUNTER — Encounter: Payer: Self-pay | Admitting: Registered Nurse

## 2024-04-26 ENCOUNTER — Encounter: Attending: Physical Medicine & Rehabilitation | Admitting: Registered Nurse

## 2024-04-26 VITALS — BP 111/76 | HR 87 | Ht 65.0 in | Wt 231.0 lb

## 2024-04-26 DIAGNOSIS — Z5181 Encounter for therapeutic drug level monitoring: Secondary | ICD-10-CM | POA: Diagnosis not present

## 2024-04-26 DIAGNOSIS — Z79891 Long term (current) use of opiate analgesic: Secondary | ICD-10-CM | POA: Insufficient documentation

## 2024-04-26 DIAGNOSIS — M25571 Pain in right ankle and joints of right foot: Secondary | ICD-10-CM | POA: Insufficient documentation

## 2024-04-26 DIAGNOSIS — G894 Chronic pain syndrome: Secondary | ICD-10-CM | POA: Diagnosis not present

## 2024-04-26 DIAGNOSIS — M25572 Pain in left ankle and joints of left foot: Secondary | ICD-10-CM | POA: Diagnosis not present

## 2024-04-26 DIAGNOSIS — M19071 Primary osteoarthritis, right ankle and foot: Secondary | ICD-10-CM | POA: Diagnosis not present

## 2024-04-26 DIAGNOSIS — G8929 Other chronic pain: Secondary | ICD-10-CM | POA: Diagnosis present

## 2024-04-26 MED ORDER — OXYCODONE HCL 10 MG PO TABS: 10.0000 mg | ORAL_TABLET | Freq: Three times a day (TID) | ORAL | 0 refills | Status: DC | PRN

## 2024-04-26 NOTE — Progress Notes (Signed)
 Subjective:    Patient ID: Brandon Reyes, male    DOB: 01-05-1961, 63 y.o.   MRN: [id number]  HPI: Brandon Reyes is a 63 y.o. male who returns for follow up appointment for chronic pain and medication refill. He states his pain is located in his bilateral ankle pain  L>R. He rates his  pain 7. His.current exercise regime is walking and performing stretching exercises.  Mr. Drotar went to [facility] on 04/20/2024 for right hip pain.   Mr. Potts Morphine equivalent is 45.00 MME.   Oral Swab was Ordered today.    Pain Inventory Average Pain 7 Pain Right Now 7 My pain is burning  In the last 24 hours, has pain interfered with the following? General activity 4 Relation with others 2 Enjoyment of life 2 What TIME of day is your pain at its worst? morning  and night Sleep (in general) Fair  Pain is worse with: walking Pain improves with: rest and medication Relief from Meds: 7  Family History  Problem Relation Age of Onset   Hypertension Mother    Diabetes Mother    Hypertension Sister    Hypertension Sister    Hypertension Maternal Grandmother    Colon cancer Other    Colon polyps Neg Hx    Esophageal cancer Neg Hx    Rectal cancer Neg Hx    Stomach cancer Neg Hx    Social History   Socioeconomic History   Marital status: Married    Spouse name: Not on file   Number of children: 0   Years of education: 12   Highest education level: Not on file  Occupational History   Occupation: Unemployed  Tobacco Use   Smoking status: Never   Smokeless tobacco: Never  Vaping Use   Vaping status: Never Used  Substance and Sexual Activity   Alcohol use: Not Currently   Drug use: Not Currently   Sexual activity: Yes  Other Topics Concern   Not on file  Social History Narrative    Fun/Hobby - Travel    Lives with Wife   Social Drivers of Health   Financial Resource Strain: Medium Risk (01/04/2024)   Overall Financial Resource Strain (CARDIA)    Difficulty of Paying  Living Expenses: Somewhat hard  Food Insecurity: No Food Insecurity (01/04/2024)   Hunger Vital Sign    Worried About Running Out of Food in the Last Year: Never true    Ran Out of Food in the Last Year: Never true  Transportation Needs: No Transportation Needs (01/04/2024)   PRAPARE - Administrator, Civil Service (Medical): No    Lack of Transportation (Non-Medical): No  Physical Activity: Sufficiently Active (01/04/2024)   Exercise Vital Sign    Days of Exercise per Week: 5 days    Minutes of Exercise per Session: 150+ min  Stress: No Stress Concern Present (01/04/2024)   [employer] of Occupational Health - Occupational Stress Questionnaire    Feeling of Stress : Not at all  Social Connections: Moderately Integrated (01/04/2024)   Social Connection and Isolation Panel    Frequency of Communication with Friends and Family: More than three times a week    Frequency of Social Gatherings with Friends and Family: Not on file    Attends Religious Services: More than 4 times per year    Active Member of [employer] or Organizations: No    Attends Banker Meetings: Never    Marital Status: Married  Past Surgical History:  Procedure Laterality Date   ANKLE FRACTURE SURGERY     COLONOSCOPY  2019   polyps, Dr Kristie   INSERTION OF MESH N/A 09/26/2016   Procedure: INSERTION OF MESH;  Surgeon: Camellia Blush, MD;  Location: [facility];  Service: General;  Laterality: N/A;   UMBILICAL HERNIA REPAIR N/A 09/26/2016   Procedure: LAPAROSCOPIC ASSISTED UMBILICAL HERNIA REPAIR WITH MESH;  Surgeon: Camellia Blush, MD;  Location: [facility];  Service: General;  Laterality: N/A;   Past Surgical History:  Procedure Laterality Date   ANKLE FRACTURE SURGERY     COLONOSCOPY  2019   polyps, Dr Kristie   INSERTION OF MESH N/A 09/26/2016   Procedure: INSERTION OF MESH;  Surgeon: Camellia Blush, MD;  Location: [facility];  Service: General;  Laterality: N/A;   UMBILICAL HERNIA REPAIR N/A 09/26/2016   Procedure:  LAPAROSCOPIC ASSISTED UMBILICAL HERNIA REPAIR WITH MESH;  Surgeon: Camellia Blush, MD;  Location: [facility];  Service: General;  Laterality: N/A;   Past Medical History:  Diagnosis Date   Arthritis    Hypertension    BP 111/76 (BP Location: Left Arm, Patient Position: Sitting, Cuff Size: Large)   Pulse 87   Ht 5' 5 (1.651 m)   Wt 231 lb (104.8 kg)   SpO2 96%   BMI 38.44 kg/m   Opioid Risk Score:   Fall Risk Score:  `1  Depression screen [facility] 2/9     02/23/2024    2:55 PM 01/04/2024   10:43 AM 10/27/2023    1:08 PM 08/18/2023   12:53 PM 06/16/2023    3:01 PM 06/16/2023    1:06 PM 03/17/2023    1:06 PM  Depression screen PHQ 2/9  Decreased Interest 0 0 0 0 1 0 0  Down, Depressed, Hopeless 0 0 0 0 0 0 0  PHQ - 2 Score 0 0 0 0 1 0 0      Review of Systems  Musculoskeletal:        Left foot pain  All other systems reviewed and are negative.      Objective:   Physical Exam Vitals and nursing note reviewed.  Cardiovascular:     Rate and Rhythm: Normal rate and regular rhythm.     Pulses: Normal pulses.     Heart sounds: Normal heart sounds.  Pulmonary:     Effort: Pulmonary effort is normal.     Breath sounds: Normal breath sounds.  Musculoskeletal:     Comments: Normal Muscle Bulk and Muscle Testing Reveals:  Upper Extremities: Full ROM and Muscle Strength 5/5  Lower Extremities: Right: Full ROM and Muscle Strength 5/5 Left Lower Extremity: Decreased ROM and Muscle Strength 5/5 Right Lower Extremity Flexion Produces Pain into his Right Foot Arises from Table Slowly Antalgic  Gait     Skin:    General: Skin is warm and dry.  Neurological:     Mental Status: He is oriented to person, place, and time.  Psychiatric:        Mood and Affect: Mood normal.        Behavior: Behavior normal.          Assessment & Plan:  Chronic Right Ankle Pain: OA Right Ankle : Continue HEP as Tolerated.  Continue current medication regimen. Continue to Monitor. 04/26/2024 Chronic  Left Ankle and Left Foot Pain: Continue HEP as Tolerated. Continue to Monitor. 04/26/2024 Chronic Pain Syndrome: Refilled: Oxycodone  10 mg one tablet three times a day as needed for pain #  90. Second script sent for the following month. We will continue the opioid monitoring program, this consists of regular clinic visits, examinations, urine drug screen, pill counts as well as use of [place]  Controlled Substance Reporting system. A 12 month History has been reviewed on the [place]  Controlled Substance Reporting System on 04/26/2024.    F/U in 2 months

## 2024-05-01 LAB — DRUG TOX MONITOR 1 W/CONF, ORAL FLD
Amphetamines: NEGATIVE ng/mL (ref <10)
Barbiturates: NEGATIVE ng/mL (ref <10)
Benzodiazepines: NEGATIVE ng/mL (ref <0.50)
Buprenorphine: NEGATIVE ng/mL (ref <0.10)
Cocaine: NEGATIVE ng/mL (ref <5.0)
Codeine: NEGATIVE ng/mL (ref <2.5)
Dihydrocodeine: NEGATIVE ng/mL (ref <2.5)
Fentanyl: NEGATIVE ng/mL (ref <0.10)
Heroin Metabolite: NEGATIVE ng/mL (ref <1.0)
Hydrocodone: NEGATIVE ng/mL (ref <2.5)
Hydromorphone: NEGATIVE ng/mL (ref <2.5)
MARIJUANA: NEGATIVE ng/mL (ref <2.5)
MDMA: NEGATIVE ng/mL (ref <10)
Meprobamate: NEGATIVE ng/mL (ref <2.5)
Methadone: NEGATIVE ng/mL (ref <5.0)
Morphine: NEGATIVE ng/mL (ref <2.5)
Nicotine Metabolite: NEGATIVE ng/mL (ref <5.0)
Norhydrocodone: NEGATIVE ng/mL (ref <2.5)
Noroxycodone: 4.7 ng/mL — ABNORMAL HIGH (ref <2.5)
Opiates: POSITIVE ng/mL — AB (ref <2.5)
Oxycodone: 131.1 ng/mL — ABNORMAL HIGH (ref <2.5)
Oxymorphone: NEGATIVE ng/mL (ref <2.5)
Phencyclidine: NEGATIVE ng/mL (ref <10)
Tapentadol: NEGATIVE ng/mL (ref <5.0)
Tramadol: NEGATIVE ng/mL (ref <5.0)
Zolpidem: NEGATIVE ng/mL (ref <5.0)

## 2024-05-01 LAB — DRUG TOX ALC METAB W/CON, ORAL FLD: Alcohol Metabolite: NEGATIVE ng/mL (ref <25)

## 2024-05-12 ENCOUNTER — Ambulatory Visit
Admission: EM | Admit: 2024-05-12 | Discharge: 2024-05-12 | Disposition: A | Discharge status: Home / Self Care | Attending: Internal Medicine | Admitting: Internal Medicine

## 2024-05-12 DIAGNOSIS — L02217 Cutaneous abscess of flank: Secondary | ICD-10-CM | POA: Diagnosis not present

## 2024-05-12 MED ORDER — DOXYCYCLINE HYCLATE 100 MG PO CAPS
100.0000 mg | ORAL_CAPSULE | Freq: Two times a day (BID) | ORAL | 0 refills | Status: AC
Start: 2024-05-12 — End: 2024-05-17

## 2024-05-12 NOTE — Discharge Instructions (Addendum)
 Incision and drainage done of right flank abscess.  Leave the current dressing in place until tomorrow.  Tomorrow morning you may remove the dressing and pull the packing out.  Wash the area with soap and water and then reapply a clean dry dressing.  Keep the area covered with a clean dry dressing and change twice daily until the area completely heals which can take anywhere from 7 to 14 days.  Start the antibiotic twice daily as directed.  If you develop increasing pain, swelling, redness, fevers then return to urgent care.  Start the following medication: Doxycycline 100 mg twice daily for 5 days. Take this with food.   Return to urgent care or PCP if symptoms worsen or fail to resolve.

## 2024-05-12 NOTE — ED Triage Notes (Signed)
 Patient reports a boil on the lower right side of the back, which is swollen, red, and painful. Some drainage occurred when it was touched by a family member. The boil was first noticed on Tuesday, and there is no fever present

## 2024-05-12 NOTE — ED Provider Notes (Signed)
 [facility]    CSN: [id number] Arrival date & time: 05/12/24  0932      History   Chief Complaint Chief Complaint  Patient presents with   Abscess    HPI Ocean Schildt is a 63 y.o. male.   63 year old male presents urgent care with complaints of a boil on his right flank.  He first noticed this on Tuesday.  Since that time it has gotten larger and more painful.  It is very red and tender to the touch.  He denies any fevers or chills.  He is not diabetic although does relate he is prediabetic.   Abscess Associated symptoms: no fever and no vomiting     Past Medical History:  Diagnosis Date   Arthritis    Hypertension     Patient Active Problem List   Diagnosis Date Noted   Diabetes due to undrl condition w oth diabetic neuro comp (HCC) 01/04/2024   Change in bowel habit 09/14/2021   Constipation 09/14/2021   Morbid obesity (HCC) 09/14/2021   History of colonic polyps 09/14/2021   Arthritis of left subtalar joint 10/25/2018   Newly diagnosed diabetes (HCC) 06/18/2018   Prediabetes 04/03/2018   Vitamin D  deficiency 03/16/2018   Encounter for general adult medical examination with abnormal findings 03/16/2018   Sinus tachycardia 08/23/2017   Anemia 08/23/2017   Pain from implanted hardware 05/18/2017   Essential hypertension 12/29/2016   Arthritis of left ankle 11/17/2016    Past Surgical History:  Procedure Laterality Date   ANKLE FRACTURE SURGERY     COLONOSCOPY  2019   polyps, Dr Kristie   INSERTION OF MESH N/A 09/26/2016   Procedure: INSERTION OF MESH;  Surgeon: Camellia Blush, MD;  Location: [facility];  Service: General;  Laterality: N/A;   UMBILICAL HERNIA REPAIR N/A 09/26/2016   Procedure: LAPAROSCOPIC ASSISTED UMBILICAL HERNIA REPAIR WITH MESH;  Surgeon: Camellia Blush, MD;  Location: [facility];  Service: General;  Laterality: N/A;       Home Medications    Prior to Admission medications   Medication Sig Start Date End Date Taking? Authorizing  Provider  amLODipine  (NORVASC ) 5 MG tablet Take 1 tablet (5 mg total) by mouth daily. 06/16/23  Yes Jason Leita Repine, FNP  doxycycline (VIBRAMYCIN) 100 MG capsule Take 1 capsule (100 mg total) by mouth 2 (two) times daily for 5 days. 05/12/24 05/17/24 Yes Cloe Sockwell A, PA-C  Oxycodone  HCl 10 MG TABS Take 1 tablet (10 mg total) by mouth 3 (three) times daily as needed. 04/26/24  Yes Debby Fidela CROME, NP  atorvastatin  (LIPITOR) 40 MG tablet Take 1 tablet (40 mg total) by mouth daily. 06/16/23   Jason Leita Repine, FNP  cephALEXin  (KEFLEX ) 500 MG capsule Take 1 capsule (500 mg total) by mouth 4 (four) times daily. 04/20/24   Jerrol Agent, MD  Cholecalciferol (VITAMIN D3) 50 MCG (2000 UT) capsule Take 2,000 Units by mouth daily. 08/25/17   [provider]  Cyanocobalamin  2500 MCG TABS Take 2,500 mcg by mouth every other day. Vitamin B12    [provider]  cyclobenzaprine  (FLEXERIL ) 10 MG tablet Take 1 tablet (10 mg total) by mouth 2 (two) times daily as needed for muscle spasms. 04/20/24   Jerrol Agent, MD  gabapentin  (NEURONTIN ) 100 MG capsule TAKE 1 CAPSULE(100 MG) BY MOUTH THREE TIMES DAILY 04/26/24   Debby Fidela CROME, NP  metoprolol  succinate (TOPROL -XL) 25 MG 24 hr tablet Take 1 tablet (25 mg total) by mouth daily. 06/16/23  Jason Leita Repine, FNP  naproxen  (NAPROSYN ) 500 MG tablet Take 1 tablet (500 mg total) by mouth 2 (two) times daily. 04/20/24   Jerrol Agent, MD  Omega-3 Fatty Acids (FISH OIL PO) Take by mouth.    [provider]  sildenafil  (VIAGRA ) 100 MG tablet TAKE 1 TABLET DAILY AS     NEEDED FOR ERECTILE        DYSFUNCTION 10/02/23   Jason Leita Repine, FNP    Family History Family History  Problem Relation Age of Onset   Hypertension Mother    Diabetes Mother    Hypertension Sister    Hypertension Sister    Hypertension Maternal Grandmother    Colon cancer Other    Colon polyps Neg Hx    Esophageal cancer Neg Hx    Rectal  cancer Neg Hx    Stomach cancer Neg Hx     Social History Social History   Tobacco Use   Smoking status: Never   Smokeless tobacco: Never  Vaping Use   Vaping status: Never Used  Substance Use Topics   Alcohol use: Not Currently   Drug use: Not Currently     Allergies   Acetaminophen  and Duloxetine  hcl   Review of Systems Review of Systems  Constitutional:  Negative for chills and fever.  HENT:  Negative for ear pain and sore throat.   Eyes:  Negative for pain and visual disturbance.  Respiratory:  Negative for cough and shortness of breath.   Cardiovascular:  Negative for chest pain and palpitations.  Gastrointestinal:  Negative for abdominal pain and vomiting.  Genitourinary:  Negative for dysuria and hematuria.  Musculoskeletal:  Negative for arthralgias and back pain.  Skin:  Positive for color change. Negative for rash.  Neurological:  Negative for seizures and syncope.  All other systems reviewed and are negative.    Physical Exam Triage Vital Signs ED Triage Vitals  Encounter Vitals Group     BP 05/12/24 1029 120/83     Girls Systolic BP Percentile --      Girls Diastolic BP Percentile --      Boys Systolic BP Percentile --      Boys Diastolic BP Percentile --      Pulse Rate 05/12/24 1029 82     Resp 05/12/24 1029 20     Temp 05/12/24 1029 98 F (36.7 C)     Temp Source 05/12/24 1029 Oral     SpO2 05/12/24 1029 95 %     Weight 05/12/24 1027 226 lb (102.5 kg)     Height 05/12/24 1027 5' 5 (1.651 m)     Head Circumference --      Peak Flow --      Pain Score --      Pain Loc --      Pain Education --      Exclude from Growth Chart --    No data found.  Updated Vital Signs BP 120/83 (BP Location: Right Arm)   Pulse 82   Temp 98 F (36.7 C) (Oral)   Resp 20   Ht 5' 5 (1.651 m)   Wt 226 lb (102.5 kg)   SpO2 95%   BMI 37.61 kg/m   Visual Acuity Right Eye Distance:   Left Eye Distance:   Bilateral Distance:    Right Eye Near:   Left  Eye Near:    Bilateral Near:     Physical Exam Vitals and nursing note reviewed.  Constitutional:  General: He is not in acute distress.    Appearance: He is well-developed.  HENT:     Head: Normocephalic and atraumatic.  Eyes:     Conjunctiva/sclera: Conjunctivae normal.  Cardiovascular:     Rate and Rhythm: Normal rate and regular rhythm.  Pulmonary:     Effort: Pulmonary effort is normal. No respiratory distress.  Abdominal:     Tenderness: There is no abdominal tenderness.     Comments: Right flank with 5 cm area of induration with central fluctuance, small pore in the middle but not consistent with an epidermal inclusion cyst, tenderness to palpation.  Musculoskeletal:        General: No swelling.     Cervical back: Neck supple.  Skin:    General: Skin is warm and dry.     Capillary Refill: Capillary refill takes less than 2 seconds.  Neurological:     Mental Status: He is alert.  Psychiatric:        Mood and Affect: Mood normal.      UC Treatments / Results  Labs (all labs ordered are listed, but only abnormal results are displayed) Labs Reviewed - No data to display  EKG   Radiology No results found.  Procedures Incision and Drainage  Date/Time: 05/12/2024 11:07 AM  Performed by: Teresa Almarie LABOR, PA-C Authorized by: Teresa Almarie LABOR, PA-C   Consent:    Consent obtained:  Verbal   Consent given by:  Patient   Risks discussed:  Bleeding, incomplete drainage, pain and damage to other organs   Alternatives discussed:  No treatment Universal protocol:    Procedure explained and questions answered to patient or proxy's satisfaction: yes     Site/side marked: yes     Immediately prior to procedure, a time out was called: yes     Patient identity confirmed:  Verbally with patient Location:    Type:  Abscess   Size:  5 cm Pre-procedure details:    Skin preparation:  Betadine Anesthesia:    Anesthesia method:  Local infiltration   Local  anesthetic:  Lidocaine  1% WITH epi Procedure type:    Complexity:  Simple Procedure details:    Incision types:  Single straight and cruciate   Incision depth:  Subcutaneous   Wound management:  Probed and deloculated, irrigated with saline and extensive cleaning   Drainage:  Purulent   Drainage amount:  Moderate   Packing materials:  1/4 in iodoform gauze Post-procedure details:    Procedure completion:  Tolerated well, no immediate complications  (including critical care time)  Medications Ordered in UC Medications - No data to display  Initial Impression / Assessment and Plan / UC Course  I have reviewed the triage vital signs and the nursing notes.  Pertinent labs & imaging results that were available during my care of the patient were reviewed by me and considered in my medical decision making (see chart for details).     Cutaneous abscess of flank   Incision and drainage done of right flank abscess.  Leave the current dressing in place until tomorrow.  Tomorrow morning you may remove the dressing and pull the packing out.  Wash the area with soap and water and then reapply a clean dry dressing.  Keep the area covered with a clean dry dressing and change twice daily until the area completely heals which can take anywhere from 7 to 14 days.  Start the antibiotic twice daily as directed.  If you develop increasing pain, swelling,  redness, fevers then return to urgent care.  Start the following medication: Doxycycline 100 mg twice daily for 5 days. Take this with food.   Return to urgent care or PCP if symptoms worsen or fail to resolve.    Final Clinical Impressions(s) / UC Diagnoses   Final diagnoses:  Cutaneous abscess of flank     Discharge Instructions      Incision and drainage done of right flank abscess.  Leave the current dressing in place until tomorrow.  Tomorrow morning you may remove the dressing and pull the packing out.  Wash the area with soap and water and  then reapply a clean dry dressing.  Keep the area covered with a clean dry dressing and change twice daily until the area completely heals which can take anywhere from 7 to 14 days.  Start the antibiotic twice daily as directed.  If you develop increasing pain, swelling, redness, fevers then return to urgent care.  Start the following medication: Doxycycline 100 mg twice daily for 5 days. Take this with food.   Return to urgent care or PCP if symptoms worsen or fail to resolve.       ED Prescriptions     Medication Sig Dispense Auth. Provider   doxycycline (VIBRAMYCIN) 100 MG capsule Take 1 capsule (100 mg total) by mouth 2 (two) times daily for 5 days. 10 capsule Teresa Almarie LABOR, [place]      PDMP not reviewed this encounter.   Teresa Almarie LABOR, [place] 05/12/24 1111

## 2024-06-18 ENCOUNTER — Telehealth: Payer: Self-pay

## 2024-06-18 DIAGNOSIS — G8929 Other chronic pain: Secondary | ICD-10-CM

## 2024-06-18 DIAGNOSIS — G894 Chronic pain syndrome: Secondary | ICD-10-CM

## 2024-06-18 MED ORDER — OXYCODONE HCL 10 MG PO TABS
10.0000 mg | ORAL_TABLET | Freq: Three times a day (TID) | ORAL | 0 refills | Status: AC | PRN
Start: 2024-06-18 — End: ?

## 2024-06-18 NOTE — Addendum Note (Signed)
 Addended by: URBANO ALBRIGHT on: 06/18/2024 10:01 PM   Modules accepted: Orders

## 2024-06-18 NOTE — Telephone Encounter (Signed)
 Mr. Brandon Reyes has #31 Oxycodone  10 MG on hand today. Patient stated his appointment was re-scheduled  by the office. So he will be seen on 07/05/2024 by Dr. Murray. Mr. Brandon Reyes wanted to make sure his refill will available on time. Can you send a refill to the pharmacy to be on hold?  Call back phone [phone].

## 2024-06-21 ENCOUNTER — Ambulatory Visit: Admitting: Physical Medicine & Rehabilitation

## 2024-07-01 ENCOUNTER — Other Ambulatory Visit: Payer: Self-pay

## 2024-07-01 DIAGNOSIS — E782 Mixed hyperlipidemia: Secondary | ICD-10-CM

## 2024-07-01 MED ORDER — ATORVASTATIN CALCIUM 40 MG PO TABS: 40.0000 mg | ORAL_TABLET | Freq: Every day | ORAL | 3 refills | Status: DC

## 2024-07-05 ENCOUNTER — Encounter: Admitting: Physical Medicine & Rehabilitation

## 2024-07-09 ENCOUNTER — Ambulatory Visit: Admitting: Family

## 2024-07-11 ENCOUNTER — Ambulatory Visit: Admitting: Family Medicine

## 2024-07-11 ENCOUNTER — Encounter: Payer: Self-pay | Admitting: Family Medicine

## 2024-07-11 VITALS — BP 135/70 | HR 70 | Temp 98.7°F | Resp 16 | Ht 65.0 in | Wt 239.0 lb

## 2024-07-11 DIAGNOSIS — Z0000 Encounter for general adult medical examination without abnormal findings: Secondary | ICD-10-CM

## 2024-07-11 DIAGNOSIS — R779 Abnormality of plasma protein, unspecified: Secondary | ICD-10-CM

## 2024-07-11 DIAGNOSIS — I10 Essential (primary) hypertension: Secondary | ICD-10-CM

## 2024-07-11 DIAGNOSIS — E119 Type 2 diabetes mellitus without complications: Secondary | ICD-10-CM

## 2024-07-11 DIAGNOSIS — E559 Vitamin D deficiency, unspecified: Secondary | ICD-10-CM

## 2024-07-11 MED ORDER — ATORVASTATIN CALCIUM 40 MG PO TABS: 40.0000 mg | ORAL_TABLET | Freq: Every day | ORAL | 1 refills | Status: AC

## 2024-07-11 MED ORDER — METOPROLOL SUCCINATE ER 25 MG PO TB24: 25.0000 mg | ORAL_TABLET | Freq: Every day | ORAL | 1 refills | Status: AC

## 2024-07-11 MED ORDER — AMLODIPINE BESYLATE 5 MG PO TABS: 5.0000 mg | ORAL_TABLET | Freq: Every day | ORAL | 1 refills | Status: AC

## 2024-07-11 NOTE — Progress Notes (Signed)
 New Patient Office Visit   Subjective     Patient ID: Brandon Reyes, male   DOB: March 15, 1961  Age: 63 y.o. MRN: [id number]   CC:  Chief Complaint  Patient presents with   Transitions Of Care    To establish care with new provider: Ankle pain, in 2 weeks will go to PT Medication for weight loss      HPI [name] presents to establish care. He is here with his wife.   Reports he recently tried to donate plasma and was told his alpha levels were high. He's pretty sure she mentioned protein when explaining to him. Requesting we repeat labs to verify.    Hypertension: - Medications: Amlodipine  5 mg daily and Metoprolol  succinate 25 mg daily.  - Compliance: good - Checking BP at home: 120s/80s - Denies any SOB, recurrent headaches, CP, vision changes, LE edema, dizziness, palpitations, or medication side effects.  BP Readings from Last 3 Encounters:  07/11/24 135/70  05/12/24 120/83  04/26/24 111/76     Hyperlipidemia: - medications: Atorvastatin  40 mg daily.  - compliance: good - medication SEs: no The ASCVD Risk score (Arnett DK, et al., 2019) failed to calculate for the following reasons:   The valid total cholesterol range is 130 to 320 mg/dL.   Diabetes: - Checking glucose at home: no - Medications: n/a - Compliance: n/a - Diet: general - Exercise: minimal - Eye exam: Overdue, last done 09/15/2022 - Foot exam: Due - Microalbumin: 11/08/2023. - Denies symptoms of hypoglycemia, polyuria, polydipsia, numbness extremities, foot ulcers/trauma, wounds that are not healing, medication side effects.  Lab Results  Component Value Date   HGBA1C 6.3 01/04/2024    Peripheral neuropathy, chronic ankle pain: - Following with pain clinic - Medications: gabapentin  100 mg TID and oxycodone  10 mg TID PRN     Show/hide medication list[1] Past Medical History:  Diagnosis Date   Arthritis    Hypertension     Past Surgical History:  Procedure Laterality Date    ANKLE FRACTURE SURGERY     COLONOSCOPY  2019   polyps, Dr Kristie   INSERTION OF MESH N/A 09/26/2016   Procedure: INSERTION OF MESH;  Surgeon: Camellia Blush, MD;  Location: [facility];  Service: General;  Laterality: N/A;   UMBILICAL HERNIA REPAIR N/A 09/26/2016   Procedure: LAPAROSCOPIC ASSISTED UMBILICAL HERNIA REPAIR WITH MESH;  Surgeon: Camellia Blush, MD;  Location: [facility];  Service: General;  Laterality: N/A;     Family History  Problem Relation Age of Onset   Hypertension Mother    Diabetes Mother    Hypertension Sister    Hypertension Sister    Hypertension Maternal Grandmother    Colon cancer Other    Colon polyps Neg Hx    Esophageal cancer Neg Hx    Rectal cancer Neg Hx    Stomach cancer Neg Hx     Social History   Socioeconomic History   Marital status: Married    Spouse name: [name]   Number of children: 0   Years of education: 12   Highest education level: Not on file  Occupational History   Occupation: Unemployed  Tobacco Use   Smoking status: Never   Smokeless tobacco: Never  Vaping Use   Vaping status: Never Used  Substance and Sexual Activity   Alcohol use: Not Currently   Drug use: Not Currently   Sexual activity: Yes  Other Topics Concern   Not on file  Social History Narrative  Fun/Hobby - Travel    Lives with Wife   Social Drivers of Health   Tobacco Use: Low Risk (07/11/2024)   Patient History    Smoking Tobacco Use: Never    Smokeless Tobacco Use: Never    Passive Exposure: Not on file  Financial Resource Strain: Medium Risk (01/04/2024)   Overall Financial Resource Strain (CARDIA)    Difficulty of Paying Living Expenses: Somewhat hard  Food Insecurity: No Food Insecurity (01/04/2024)   Hunger Vital Sign    Worried About Running Out of Food in the Last Year: Never true    Ran Out of Food in the Last Year: Never true  Transportation Needs: No Transportation Needs (01/04/2024)   PRAPARE - Administrator, Civil Service (Medical): No     Lack of Transportation (Non-Medical): No  Physical Activity: Sufficiently Active (01/04/2024)   Exercise Vital Sign    Days of Exercise per Week: 5 days    Minutes of Exercise per Session: 150+ min  Stress: No Stress Concern Present (01/04/2024)   [employer] of Occupational Health - Occupational Stress Questionnaire    Feeling of Stress : Not at all  Social Connections: Moderately Integrated (01/04/2024)   Social Connection and Isolation Panel    Frequency of Communication with Friends and Family: More than three times a week    Frequency of Social Gatherings with Friends and Family: Not on file    Attends Religious Services: More than 4 times per year    Active Member of Clubs or Organizations: No    Attends Banker Meetings: Never    Marital Status: Married  Depression (PHQ2-9): Low Risk (02/23/2024)   Depression (PHQ2-9)    PHQ-2 Score: 0  Alcohol Screen: Low Risk (01/04/2024)   Alcohol Screen    Last Alcohol Screening Score (AUDIT): 0  Housing: Low Risk (01/04/2024)   Housing Stability Vital Sign    Unable to Pay for Housing in the Last Year: No    Number of Times Moved in the Last Year: 0    Homeless in the Last Year: No  Utilities: Not At Risk (01/04/2024)   AHC Utilities    Threatened with loss of utilities: No  Health Literacy: Adequate Health Literacy (01/04/2024)   B1300 Health Literacy    Frequency of need for help with medical instructions: Never       ROS All review of systems negative except what is listed in the HPI    Objective     BP 135/70 (BP Location: Right Arm, Patient Position: Sitting, Cuff Size: Large)   Pulse 70   Temp 98.7 F (37.1 C) (Oral)   Resp 16   Ht 5' 5 (1.651 m)   Wt 239 lb (108.4 kg)   SpO2 100%   BMI 39.77 kg/m    Physical Exam Vitals reviewed.  Constitutional:      Appearance: Normal appearance. He is obese.  Cardiovascular:     Rate and Rhythm: Normal rate and regular rhythm.     Heart sounds: Normal heart sounds.   Pulmonary:     Effort: Pulmonary effort is normal.     Breath sounds: Normal breath sounds.  Skin:    General: Skin is warm and dry.  Neurological:     Mental Status: He is alert and oriented to person, place, and time.  Psychiatric:        Mood and Affect: Mood normal.        Behavior: Behavior normal.  Thought Content: Thought content normal.        Judgment: Judgment normal.        Assessment & Plan:     Problem List Items Addressed This Visit       Active Problems   Essential hypertension - Primary   Relevant Medications   amLODipine  (NORVASC ) 5 MG tablet   atorvastatin  (LIPITOR) 40 MG tablet   metoprolol  succinate (TOPROL -XL) 25 MG 24 hr tablet   Other Relevant Orders   Lipid panel   CBC with Differential/Platelet   Comp Met (CMET)   Vitamin D  deficiency   Relevant Orders   VITAMIN D  25 Hydroxy (Vit-D Deficiency, Fractures)   Comp Met (CMET)   Other Visit Diagnoses       Encounter for medical examination to establish care         Mixed hyperlipidemia       Relevant Medications   amLODipine  (NORVASC ) 5 MG tablet   atorvastatin  (LIPITOR) 40 MG tablet   metoprolol  succinate (TOPROL -XL) 25 MG 24 hr tablet   Other Relevant Orders   Lipid panel     Type 2 diabetes mellitus without complication, without long-term current use of insulin (HCC)       Relevant Medications   atorvastatin  (LIPITOR) 40 MG tablet   Other Relevant Orders   Hemoglobin A1c   CBC with Differential/Platelet   Comp Met (CMET)     Abnormal plasma protein test       Relevant Orders   Protein Electrophoresis, (serum)       *** Assessment and Plan Assessment & Plan Essential hypertension Blood pressure controlled with home readings 120s/60s and office 130s/70s. - Continue amlodipine  5 mg and metoprolol  25 mg once daily.  Mixed hyperlipidemia Managed with atorvastatin  40 mg. - Continue atorvastatin  40 mg daily.  Type 2 diabetes mellitus Last A1c 6.3 in June. Insurance  requires diabetic range A1c for Mounjaro . No family history of medullary thyroid  cancer or MEN. No pancreatitis history. - Ordered blood work for A1c and other parameters. - Consider Mounjaro  if A1c indicates diabetes.  Chronic ankle pain Managed with gabapentin  and oxycodone . Undergoing therapy for recent injury. - Continue gabapentin  100 mg three times daily. - Continue oxycodone  10 mg three times daily as needed. - Continue physical therapy as scheduled.  General Health Maintenance No current B12 or vitamin D  supplementation. Previous B12 levels not low. - Consider rechecking B12 and vitamin D  levels if clinically indicated.  ***review          Return in about 4 months (around 11/09/2024) for chronic disease management.  Waddell KATHEE Mon, NP  I,Emily Lagle,acting as a scribe for Waddell KATHEE Mon, NP.,have documented all relevant documentation on the behalf of Waddell KATHEE Mon, NP.  I, Waddell KATHEE Mon, NP, have reviewed all documentation for this visit. The documentation on 07/11/2024 for the exam, diagnosis, procedures, and orders are all accurate and complete.     [1]  Outpatient Medications Prior to Visit  Medication Sig   gabapentin  (NEURONTIN ) 100 MG capsule TAKE 1 CAPSULE(100 MG) BY MOUTH THREE TIMES DAILY   Omega-3 Fatty Acids (FISH OIL PO) Take by mouth.   Oxycodone  HCl 10 MG TABS Take 1 tablet (10 mg total) by mouth 3 (three) times daily as needed.   sildenafil  (VIAGRA ) 100 MG tablet TAKE 1 TABLET DAILY AS     NEEDED FOR ERECTILE        DYSFUNCTION   [DISCONTINUED] amLODipine  (NORVASC ) 5 MG tablet Take 1 tablet (  5 mg total) by mouth daily.   [DISCONTINUED] atorvastatin  (LIPITOR) 40 MG tablet Take 1 tablet (40 mg total) by mouth daily.   [DISCONTINUED] cephALEXin  (KEFLEX ) 500 MG capsule Take 1 capsule (500 mg total) by mouth 4 (four) times daily.   [DISCONTINUED] metoprolol  succinate (TOPROL -XL) 25 MG 24 hr tablet Take 1 tablet (25 mg total) by mouth daily.   [DISCONTINUED]  Cholecalciferol (VITAMIN D3) 50 MCG (2000 UT) capsule Take 2,000 Units by mouth daily. (Patient not taking: Reported on 07/11/2024)   [DISCONTINUED] Cyanocobalamin  2500 MCG TABS Take 2,500 mcg by mouth every other day. Vitamin B12 (Patient not taking: Reported on 07/11/2024)   [DISCONTINUED] cyclobenzaprine  (FLEXERIL ) 10 MG tablet Take 1 tablet (10 mg total) by mouth 2 (two) times daily as needed for muscle spasms. (Patient not taking: Reported on 07/11/2024)   [DISCONTINUED] naproxen  (NAPROSYN ) 500 MG tablet Take 1 tablet (500 mg total) by mouth 2 (two) times daily. (Patient not taking: Reported on 07/11/2024)   No facility-administered medications prior to visit.

## 2024-07-12 DIAGNOSIS — E782 Mixed hyperlipidemia: Secondary | ICD-10-CM | POA: Insufficient documentation

## 2024-07-12 LAB — COMPREHENSIVE METABOLIC PANEL WITH GFR
ALT: 18 U/L (ref 0–53)
AST: 17 U/L (ref 0–37)
Albumin: 3.9 g/dL (ref 3.5–5.2)
Alkaline Phosphatase: 49 U/L (ref 39–117)
BUN: 15 mg/dL (ref 6–23)
CO2: 30 meq/L (ref 19–32)
Calcium: 9.4 mg/dL (ref 8.4–10.5)
Chloride: 102 meq/L (ref 96–112)
Creatinine, Ser: 1 mg/dL (ref 0.40–1.50)
GFR: 80.66 mL/min (ref >60.00)
Glucose, Bld: 96 mg/dL (ref 70–99)
Potassium: 4 meq/L (ref 3.5–5.1)
Sodium: 140 meq/L (ref 135–145)
Total Bilirubin: 0.3 mg/dL (ref 0.2–1.2)
Total Protein: 7 g/dL (ref 6.0–8.3)

## 2024-07-12 LAB — LIPID PANEL
Cholesterol: 138 mg/dL (ref 0–200)
HDL: 43.7 mg/dL (ref >39.00)
LDL Cholesterol: 77 mg/dL (ref 0–99)
NonHDL: 94.64
Total CHOL/HDL Ratio: 3
Triglycerides: 86 mg/dL (ref 0.0–149.0)
VLDL: 17.2 mg/dL (ref 0.0–40.0)

## 2024-07-12 LAB — CBC WITH DIFFERENTIAL/PLATELET
Basophils Absolute: 0.1 K/uL (ref 0.0–0.1)
Basophils Relative: 1.1 % (ref 0.0–3.0)
Eosinophils Absolute: 0.4 K/uL (ref 0.0–0.7)
Eosinophils Relative: 3.9 % (ref 0.0–5.0)
HCT: 41 % (ref 39.0–52.0)
Hemoglobin: 13.3 g/dL (ref 13.0–17.0)
Lymphocytes Relative: 23.9 % (ref 12.0–46.0)
Lymphs Abs: 2.5 K/uL (ref 0.7–4.0)
MCHC: 32.5 g/dL (ref 30.0–36.0)
MCV: 87.9 fl (ref 78.0–100.0)
Monocytes Absolute: 0.8 K/uL (ref 0.1–1.0)
Monocytes Relative: 8.1 % (ref 3.0–12.0)
Neutro Abs: 6.6 K/uL (ref 1.4–7.7)
Neutrophils Relative %: 63 % (ref 43.0–77.0)
Platelets: 204 K/uL (ref 150.0–400.0)
RBC: 4.66 Mil/uL (ref 4.22–5.81)
RDW: 13.6 % (ref 11.5–15.5)
WBC: 10.4 K/uL (ref 4.0–10.5)

## 2024-07-12 LAB — HEMOGLOBIN A1C: Hgb A1c MFr Bld: 6.5 % (ref 4.6–6.5)

## 2024-07-12 LAB — VITAMIN D 25 HYDROXY (VIT D DEFICIENCY, FRACTURES): VITD: 12.27 ng/mL — ABNORMAL LOW (ref 30.00–100.00)

## 2024-07-12 NOTE — Assessment & Plan Note (Signed)
 Blood pressure is at goal for age and co-morbidities.   Recommendations: continue amlodipine  5 mg daily and metoprolol  25 mg daily - BP goal <130/80 - monitor and log blood pressures at home - check around the same time each day in a relaxed setting - Limit salt to <2000 mg/day - Follow DASH eating plan (heart healthy diet) - limit alcohol to 2 standard drinks per day for men and 1 per day for women - avoid tobacco products - get at least 2 hours of regular aerobic exercise weekly Patient aware of signs/symptoms requiring further/urgent evaluation.

## 2024-07-12 NOTE — Assessment & Plan Note (Signed)
-   Ordered blood work for A1c and other parameters. - Consider Mounjaro  if A1c indicates necessary

## 2024-07-12 NOTE — Assessment & Plan Note (Signed)
History of deficiency. Labs today.

## 2024-07-12 NOTE — Assessment & Plan Note (Signed)
 Medication management: atorvastatin 40 mg daily  Lifestyle factors for lowering cholesterol include: Diet therapy - heart-healthy diet rich in fruits, veggies, fiber-rich whole grains, lean meats, chicken, fish (at least twice a week), fat-free or 1% dairy products; foods low in saturated/trans fats, cholesterol, sodium, and sugar. Mediterranean diet has shown to be very heart healthy. Regular exercise - recommend at least 30 minutes a day, 5 times per week Weight management

## 2024-07-15 ENCOUNTER — Ambulatory Visit: Payer: Self-pay | Admitting: Family Medicine

## 2024-07-15 ENCOUNTER — Telehealth: Payer: Self-pay

## 2024-07-15 DIAGNOSIS — E559 Vitamin D deficiency, unspecified: Secondary | ICD-10-CM

## 2024-07-15 MED ORDER — VITAMIN D (ERGOCALCIFEROL) 1.25 MG (50000 UNIT) PO CAPS: 50000.0000 [IU] | ORAL_CAPSULE | ORAL | 0 refills | Status: AC

## 2024-07-15 NOTE — Telephone Encounter (Unsigned)
 Copied from CRM [id number]. Topic: Clinical - Lab/Test Results >> Jul 15, 2024 10:18 AM Franky GRADE wrote: Reason for CRM: Patient is calling to go over lab results he had done on 07/12/2024. I advised once results have been reviewed by the provider he would receive a call with notes and recommendations.

## 2024-07-15 NOTE — Telephone Encounter (Signed)
 Copied from CRM [id number]. Topic: Clinical - Lab/Test Results >> Jul 15, 2024 10:18 AM Franky GRADE wrote: Reason for CRM: Patient is calling to go over lab results he had done on 07/12/2024. I advised once results have been reviewed by the provider he would receive a call with notes and recommendations. >> Jul 15, 2024  4:05 PM China J wrote: The patient and his wife are calling to see if there has been an update on his lab results. They also wanted to check the status on his mounjaro . I let them know that at this time, his provider has not reviewed the lab work that was completed on the 12th and may need some time to go over everything since the weekend just ended. They would also like to know if the medical assistant can go over the $25 off coupon that can be used for mounjaro . Lastly, they would like to remind Waddell that his protein results needs to be sent over to [facility] on  [address] since he cannot receive his donation check until they have his results.  Please call [place] at [phone].

## 2024-07-16 LAB — PROTEIN ELECTROPHORESIS, SERUM
Albumin ELP: 3.7 g/dL — ABNORMAL LOW (ref 3.8–4.8)
Alpha 1: 0.3 g/dL (ref 0.2–0.3)
Alpha 2: 0.7 g/dL (ref 0.5–0.9)
Beta 2: 0.4 g/dL (ref 0.2–0.5)
Beta Globulin: 0.5 g/dL (ref 0.4–0.6)
Gamma Globulin: 1.1 g/dL (ref 0.8–1.7)
Total Protein: 6.7 g/dL (ref 6.1–8.1)

## 2024-07-17 MED ORDER — TIRZEPATIDE 2.5 MG/0.5ML ~~LOC~~ SOAJ: 2.5000 mg | SUBCUTANEOUS | 1 refills | Status: DC

## 2024-07-19 ENCOUNTER — Telehealth: Payer: Self-pay | Admitting: Registered Nurse

## 2024-07-19 ENCOUNTER — Telehealth: Payer: Self-pay | Admitting: Physical Medicine & Rehabilitation

## 2024-07-19 DIAGNOSIS — G894 Chronic pain syndrome: Secondary | ICD-10-CM

## 2024-07-19 DIAGNOSIS — G8929 Other chronic pain: Secondary | ICD-10-CM

## 2024-07-19 MED ORDER — OXYCODONE HCL 10 MG PO TABS: 10.0000 mg | ORAL_TABLET | Freq: Three times a day (TID) | ORAL | 0 refills | Status: DC | PRN

## 2024-07-19 NOTE — Telephone Encounter (Signed)
 PDMP was Reviewed.  UDS reviewed.  Oxycodone  e-scribed to pharmacy.

## 2024-07-19 NOTE — Telephone Encounter (Signed)
 Pt needs med refill on oxycodone 

## 2024-07-24 ENCOUNTER — Telehealth: Payer: Self-pay | Admitting: Family Medicine

## 2024-07-24 NOTE — Telephone Encounter (Signed)
 Pt dropped off form to be completed be pcp. Placed in pcp's tray in fo. Pt asks to be called when ready for pick-up states he can come get it next Wednesday.

## 2024-07-29 NOTE — Telephone Encounter (Signed)
 Pt called to check the status of paperwork. I informed him paperwork is competed and ready for pick up on Wednesday.

## 2024-07-29 NOTE — Telephone Encounter (Signed)
 Form completed and at the front for pick up for Wednesday.

## 2024-07-31 ENCOUNTER — Telehealth: Payer: Self-pay | Admitting: Family Medicine

## 2024-07-31 NOTE — Telephone Encounter (Signed)
 Called patient, he states he has two pens left (2 weeks worth). Let him know he has a refill at the pharmacy. He will let us  know next month if he is having any issues (none at the moment) and will send message to [facility] about increasing dosage. He expressed understanding.

## 2024-07-31 NOTE — Telephone Encounter (Signed)
 Pt came in needing a refill on Mounjaro . Please sent to pharmacy on file and call pt when it has been sent in.

## 2024-07-31 NOTE — Telephone Encounter (Signed)
 Mounjaro  just sent on 07/17/2024 with a refill, will contact patient to see what is really needed.

## 2024-08-16 ENCOUNTER — Encounter: Admitting: Physical Medicine & Rehabilitation

## 2024-08-17 ENCOUNTER — Other Ambulatory Visit: Payer: Self-pay | Admitting: Registered Nurse

## 2024-08-17 DIAGNOSIS — G629 Polyneuropathy, unspecified: Secondary | ICD-10-CM

## 2024-08-20 ENCOUNTER — Other Ambulatory Visit: Payer: Self-pay | Admitting: Neurology

## 2024-08-20 NOTE — Telephone Encounter (Signed)
 PDMP was Reviewed.  Dr Urbano note was reviewed.  Gabapentin  e- scribed today.

## 2024-08-20 NOTE — Telephone Encounter (Signed)
 Pended next dosage, sign if appropriate.   Copied from CRM #[id number]. Topic: Clinical - Medication Question >> Aug 20, 2024 12:39 PM Brittany M wrote: Reason for CRM: Patient currently on tirzepatide  (MOUNJARO ) 2.5 MG/0.5ML Pen wanting to go up in dosage- has 2 left now.

## 2024-08-21 MED ORDER — TIRZEPATIDE 5 MG/0.5ML ~~LOC~~ SOAJ: 5.0000 mg | SUBCUTANEOUS | 1 refills | Status: AC

## 2024-08-30 ENCOUNTER — Encounter: Payer: Self-pay | Admitting: Physical Medicine & Rehabilitation

## 2024-08-30 ENCOUNTER — Encounter: Attending: Physical Medicine & Rehabilitation | Admitting: Physical Medicine & Rehabilitation

## 2024-08-30 VITALS — BP 136/81 | HR 88 | Ht 65.0 in | Wt 239.8 lb

## 2024-08-30 DIAGNOSIS — M25572 Pain in left ankle and joints of left foot: Secondary | ICD-10-CM | POA: Insufficient documentation

## 2024-08-30 DIAGNOSIS — M25571 Pain in right ankle and joints of right foot: Secondary | ICD-10-CM | POA: Diagnosis present

## 2024-08-30 DIAGNOSIS — G8929 Other chronic pain: Secondary | ICD-10-CM | POA: Diagnosis present

## 2024-08-30 DIAGNOSIS — G629 Polyneuropathy, unspecified: Secondary | ICD-10-CM | POA: Diagnosis present

## 2024-08-30 DIAGNOSIS — G894 Chronic pain syndrome: Secondary | ICD-10-CM | POA: Insufficient documentation

## 2024-08-30 MED ORDER — OXYCODONE HCL 10 MG PO TABS: 10.0000 mg | ORAL_TABLET | Freq: Three times a day (TID) | ORAL | 0 refills | Status: AC | PRN

## 2024-08-30 NOTE — Progress Notes (Signed)
 "  Subjective:    Patient ID: Brandon Reyes, male    DOB: 02-10-1961, 64 y.o.   MRN: [id number]  HPI:    HPI 01/04/22 64 year old male here with PMH of type 2 DM, HTN here for chronic left ankle pain.  He broke his ankle in 2015 when he stepped off a curb and had ORIF.   He reports he had 2 other surgeries after this including ankle arthrodesis by Dr. Glendia at [facility]. He continues to have severe pain since this time. Pain is constant but worsened by walking. He walks with a cane.  Pain is sharp and achy.  Ankle brace did not help his pain.  He had ankle injections without benefit. Patient reports he takes tylenol  with minimal benefit. Reports percocet helped his pain in the past. When asked if he took lyrica, gabapentin  his he wasn't certain but his partner thought he used these without benefit. She said he tried tramadol  and it didn't help. Lidocaine  cream and patch didn't help the pain. Has not tried TENS unit but has access to TENS.   Interval History 08/04/2022 Brandon Reyes is here to follow-up regarding his chronic pain in his left ankle.  He continues to use oxycodone  10 mg 3 times daily as needed.  He reports this medicine is helping keep his pain under control.  Denies side effects with this medication.  He reports he is able to walk around his house when using the medication.  Allows him to be more active than go to stores and do other such activities with his wife.   Interval history 10/08/2022 Brandon Reyes is here for follow-up regarding his chronic ankle pain.  He reports oxycodone  continues to help keep his pain tolerable.  No side effects with this medication reported.  He does report he feels like he is having some discomfort in his right ankle as well, not as severe as his left side.  He denies any significant injury to this joint.  Patient is able to walk more and do more chores around the home, go to the store with the oxycodone  medication.  Patient would also like to pick upKnee-high compression  stockings for dependent edema in his legs.   Interval history 12/30/22 Brandon Reyes is here for follow-up for his left ankle pain.  He reports he is doing overall well and continues to use oxycodone .  He says this medicine helps keep his pain under control and he is able to do more than he used to do without medication.  He is still as active as he used to be when he was younger but he feels like he is able to function.  He is excited about his birthday coming up on [birth year].  He does have some occasional pain in his right ankle as well.  He says if this gets worse he will plan to see his podiatrist.   Interval history 03/17/23 Brandon Reyes is here for follow-up regarding his ankle pain.  He continues to have pain with ambulation in his left ankle.  Pain has not changed since last visit.  Oxycodone  continues to help keep his pain under control.  He tries to use it when his pain is severe.  He is not having any side effects with oxycodone .  He has some pain and swelling in his left ankle today from increased ambulation.   Interval history 06/16/23 Brandon Reyes is here for follow-up regarding his left ankle pain.  Overall his pain is unchanged  since prior visit.  He continues to use oxycodone  10 mg which is helping keep his pain under control, not causing any significant side effects.  He has been using a cane for ambulation but like to try to see if he can walk without this in the home and use it only for longer distances.  He also has pain in his right ankle primarily when ambulating for longer distances.  Interval history 12/29/23 Pt last seen by [name].  Ankle pain in his ankles  L>R controlled with current dose oxycodone  and no side effects with medication. Medication helps him to stay active.  Over past few months he has been having burning pain tingling and altered sensation in his b/l feet left > right.  Hx of prediabetes vs diabetes. Pain is worse at night.   Interval history 02/23/24 Patient reports  he is doing well overall.  Continues to have pain in his ankles left greater than right.  Continues to be managed with oxycodone .  He reports that the burning pain in his feet left greater than right has also improved with low-dose gabapentin .  Discomfort is worse at night.  He is not having any side effects with either medications.  Interval history 08/30/24 The patient reports his pain is unchanged, with some increased aching in the cold weather. He continues to take oxycodone  10 mg three times daily, which provides good pain relief without side effects like constipation. He is using a brace for his right ankle, which was provided by another doctor who also prescribed turmeric, and ginger. He is undergoing physical therapy for the right ankle. He declines surgery for the right ankle. He is using Mounjaro  for weight loss to help reduce stress on his joints.   Pain Inventory Average Pain 7 Pain Right Now 7 My pain is intermittent and aching  In the last 24 hours, has pain interfered with the following? General activity 5 Relation with others 0 Enjoyment of life 0 What TIME of day is your pain at its worst? varies Sleep (in general) Fair  Pain is worse with: walking and standing Pain improves with: rest and medication Relief from Meds: 8  Family History  Problem Relation Age of Onset   Hypertension Mother    Diabetes Mother    Hypertension Sister    Hypertension Sister    Hypertension Maternal Grandmother    Colon cancer Other    Colon polyps Neg Hx    Esophageal cancer Neg Hx    Rectal cancer Neg Hx    Stomach cancer Neg Hx    Social History   Socioeconomic History   Marital status: Married    Spouse name: [name]   Number of children: 0   Years of education: 12   Highest education level: Not on file  Occupational History   Occupation: Unemployed  Tobacco Use   Smoking status: Never   Smokeless tobacco: Never  Vaping Use   Vaping status: Never Used  Substance and  Sexual Activity   Alcohol use: Not Currently   Drug use: Not Currently   Sexual activity: Yes  Other Topics Concern   Not on file  Social History Narrative    Fun/Hobby - Travel    Lives with Wife   Social Drivers of Health   Tobacco Use: Low Risk (08/30/2024)   Patient History    Smoking Tobacco Use: Never    Smokeless Tobacco Use: Never    Passive Exposure: Not on file  Financial Resource Strain: Medium  Risk (01/04/2024)   Overall Financial Resource Strain (CARDIA)    Difficulty of Paying Living Expenses: Somewhat hard  Food Insecurity: No Food Insecurity (01/04/2024)   Hunger Vital Sign    Worried About Running Out of Food in the Last Year: Never true    Ran Out of Food in the Last Year: Never true  Transportation Needs: No Transportation Needs (01/04/2024)   PRAPARE - Administrator, Civil Service (Medical): No    Lack of Transportation (Non-Medical): No  Physical Activity: Sufficiently Active (01/04/2024)   Exercise Vital Sign    Days of Exercise per Week: 5 days    Minutes of Exercise per Session: 150+ min  Stress: No Stress Concern Present (01/04/2024)   [employer] of Occupational Health - Occupational Stress Questionnaire    Feeling of Stress : Not at all  Social Connections: Moderately Integrated (01/04/2024)   Social Connection and Isolation Panel    Frequency of Communication with Friends and Family: More than three times a week    Frequency of Social Gatherings with Friends and Family: Not on file    Attends Religious Services: More than 4 times per year    Active Member of Clubs or Organizations: No    Attends Banker Meetings: Never    Marital Status: Married  Depression (PHQ2-9): Low Risk (08/30/2024)   Depression (PHQ2-9)    PHQ-2 Score: 0  Alcohol Screen: Low Risk (01/04/2024)   Alcohol Screen    Last Alcohol Screening Score (AUDIT): 0  Housing: Low Risk (01/04/2024)   Housing Stability Vital Sign    Unable to Pay for Housing in the  Last Year: No    Number of Times Moved in the Last Year: 0    Homeless in the Last Year: No  Utilities: Not At Risk (01/04/2024)   AHC Utilities    Threatened with loss of utilities: No  Health Literacy: Adequate Health Literacy (01/04/2024)   B1300 Health Literacy    Frequency of need for help with medical instructions: Never   Past Surgical History:  Procedure Laterality Date   ANKLE FRACTURE SURGERY     COLONOSCOPY  2019   polyps, Dr Kristie   INSERTION OF MESH N/A 09/26/2016   Procedure: INSERTION OF MESH;  Surgeon: Camellia Blush, MD;  Location: [facility];  Service: General;  Laterality: N/A;   UMBILICAL HERNIA REPAIR N/A 09/26/2016   Procedure: LAPAROSCOPIC ASSISTED UMBILICAL HERNIA REPAIR WITH MESH;  Surgeon: Camellia Blush, MD;  Location: [facility];  Service: General;  Laterality: N/A;   Past Surgical History:  Procedure Laterality Date   ANKLE FRACTURE SURGERY     COLONOSCOPY  2019   polyps, Dr Kristie   INSERTION OF MESH N/A 09/26/2016   Procedure: INSERTION OF MESH;  Surgeon: Camellia Blush, MD;  Location: [facility];  Service: General;  Laterality: N/A;   UMBILICAL HERNIA REPAIR N/A 09/26/2016   Procedure: LAPAROSCOPIC ASSISTED UMBILICAL HERNIA REPAIR WITH MESH;  Surgeon: Camellia Blush, MD;  Location: [facility];  Service: General;  Laterality: N/A;   Past Medical History:  Diagnosis Date   Arthritis    Hypertension    BP 136/81   Pulse 88   Ht 5' 5 (1.651 m)   Wt 239 lb 12.8 oz (108.8 kg)   SpO2 95%   BMI 39.90 kg/m   Opioid Risk Score:   Fall Risk Score:  `1  Depression screen [facility] 2/9     08/30/2024    3:31 PM 02/23/2024  2:55 PM 01/04/2024   10:43 AM 10/27/2023    1:08 PM 08/18/2023   12:53 PM 06/16/2023    3:01 PM 06/16/2023    1:06 PM  Depression screen PHQ 2/9  Decreased Interest 0 0 0 0 0 1 0  Down, Depressed, Hopeless 0 0 0 0 0 0 0  PHQ - 2 Score 0 0 0 0 0 1 0     Review of Systems  Musculoskeletal:  Positive for gait problem.        Left foot pain  All other systems  reviewed and are negative.      Objective:  Gen: no distress, normal appearing HEENT: oral mucosa pink and moist, NCAT Chest: normal effort, normal rate of breathing Abd:  non-distended Psych: pleasant, appropriate Skin: intact Neuro: Awake and alert CN 2-12 grossly intact, no ataxia noted, follows commands Musculoskeletal no focal motor deficits. Minimal ROM at L ankle-due to prior fusion Sensation intact all 4 extremities but altered is distal feet- unchanged  L > R ankle tenderness Mild Pain with R ankle ROM              Assessment & Plan:    Chronic L ankle pain after ankle fracture and multiple surgeries -s/p ORIF and later ankle arthrodesis, seen by podiatry without any further procedural options -Reports poor results with multiple other medications and treatments             -He was unable to tolerate duloxetine               -Continue oxycodone  10 mg TID, reports keeping pain controlled, reordered today for next 2 refills             -Advised trying TENS unit             -Continue monitoring random UDS, pill counts, PDMP -Pill count is consistent today   Right ankle pain -Pain mostly with ambulation.   -X-rays in 2022 with medial OA changes noted.  I do think altered gait due to his left ankle may be contributing to OA of his right ankle.   Alcohol use history -Continue to reinforce abstinence from alcohol and illicit drugs -Continue UDS monitoring   Likely polyneuropathy due to diabetes vs prediabetes. Last A1C 6.3 but had 6.8 A1C in 2022 - Continue gabapentin  100mg  TID-reports this is helping and keeping pain at tolerable level           "

## 2024-10-25 ENCOUNTER — Encounter: Admitting: Physical Medicine & Rehabilitation

## 2025-01-07 ENCOUNTER — Ambulatory Visit

## 2025-02-05 ENCOUNTER — Ambulatory Visit: Admitting: Family Medicine
